# Patient Record
Sex: Male | Born: 1940 | Race: Black or African American | Hispanic: No | Marital: Married | Smoking: Former smoker
Health system: Southern US, Community
[De-identification: ages and names within clinical notes are randomized; demographics above are authoritative.]

## PROBLEM LIST (undated history)

## (undated) DIAGNOSIS — I509 Heart failure, unspecified: Secondary | ICD-10-CM

## (undated) DIAGNOSIS — J8410 Pulmonary fibrosis, unspecified: Secondary | ICD-10-CM

## (undated) DIAGNOSIS — IMO0001 Reserved for inherently not codable concepts without codable children: Secondary | ICD-10-CM

## (undated) DIAGNOSIS — I10 Essential (primary) hypertension: Secondary | ICD-10-CM

## (undated) DIAGNOSIS — I4891 Unspecified atrial fibrillation: Secondary | ICD-10-CM

## (undated) DIAGNOSIS — J449 Chronic obstructive pulmonary disease, unspecified: Secondary | ICD-10-CM

## (undated) DIAGNOSIS — I639 Cerebral infarction, unspecified: Secondary | ICD-10-CM

## (undated) HISTORY — DX: Unspecified atrial fibrillation: I48.91

## (undated) HISTORY — DX: Essential (primary) hypertension: I10

## (undated) HISTORY — DX: Cerebral infarction, unspecified: I63.9

## (undated) HISTORY — PX: FEMUR FRACTURE SURGERY: SHX633

---

## 1973-07-11 HISTORY — PX: TIBIA FRACTURE SURGERY: SHX806

## 1999-07-12 HISTORY — PX: BACK SURGERY: SHX140

## 2000-04-26 ENCOUNTER — Encounter: Payer: Self-pay | Admitting: Neurosurgery

## 2000-04-26 ENCOUNTER — Inpatient Hospital Stay (HOSPITAL_COMMUNITY): Admission: RE | Admit: 2000-04-26 | Discharge: 2000-04-27 | Payer: Self-pay | Admitting: Neurosurgery

## 2000-08-21 ENCOUNTER — Inpatient Hospital Stay (HOSPITAL_COMMUNITY): Admission: EM | Admit: 2000-08-21 | Discharge: 2000-08-28 | Payer: Self-pay

## 2000-08-23 ENCOUNTER — Encounter: Payer: Self-pay | Admitting: Internal Medicine

## 2000-08-26 ENCOUNTER — Encounter: Payer: Self-pay | Admitting: Internal Medicine

## 2000-09-25 ENCOUNTER — Encounter: Payer: Self-pay | Admitting: Internal Medicine

## 2000-09-25 ENCOUNTER — Ambulatory Visit (HOSPITAL_COMMUNITY): Admission: RE | Admit: 2000-09-25 | Discharge: 2000-09-25 | Payer: Self-pay | Admitting: Internal Medicine

## 2000-09-29 ENCOUNTER — Ambulatory Visit (HOSPITAL_COMMUNITY): Admission: RE | Admit: 2000-09-29 | Discharge: 2000-09-29 | Payer: Self-pay | Admitting: Internal Medicine

## 2000-09-29 ENCOUNTER — Encounter: Payer: Self-pay | Admitting: Internal Medicine

## 2005-02-14 ENCOUNTER — Ambulatory Visit (HOSPITAL_COMMUNITY): Admission: RE | Admit: 2005-02-14 | Discharge: 2005-02-14 | Payer: Self-pay | Admitting: Internal Medicine

## 2005-09-23 ENCOUNTER — Inpatient Hospital Stay (HOSPITAL_COMMUNITY): Admission: EM | Admit: 2005-09-23 | Discharge: 2005-09-28 | Payer: Self-pay | Admitting: Family Medicine

## 2005-09-26 ENCOUNTER — Encounter (INDEPENDENT_AMBULATORY_CARE_PROVIDER_SITE_OTHER): Payer: Self-pay | Admitting: Interventional Cardiology

## 2009-06-11 ENCOUNTER — Encounter: Payer: Self-pay | Admitting: Internal Medicine

## 2009-06-11 ENCOUNTER — Ambulatory Visit (HOSPITAL_COMMUNITY): Admission: RE | Admit: 2009-06-11 | Discharge: 2009-06-11 | Payer: Self-pay | Admitting: Internal Medicine

## 2009-06-11 ENCOUNTER — Ambulatory Visit: Payer: Self-pay | Admitting: Surgery

## 2009-11-10 ENCOUNTER — Encounter: Payer: Self-pay | Admitting: Cardiology

## 2010-02-02 ENCOUNTER — Ambulatory Visit: Payer: Self-pay | Admitting: Cardiology

## 2010-02-02 DIAGNOSIS — I10 Essential (primary) hypertension: Secondary | ICD-10-CM

## 2010-02-02 DIAGNOSIS — E785 Hyperlipidemia, unspecified: Secondary | ICD-10-CM | POA: Insufficient documentation

## 2010-02-02 DIAGNOSIS — I480 Paroxysmal atrial fibrillation: Secondary | ICD-10-CM | POA: Insufficient documentation

## 2010-02-04 LAB — CONVERTED CEMR LAB
BUN: 15 mg/dL (ref 6–23)
Basophils Relative: 0.6 % (ref 0.0–3.0)
CO2: 29 meq/L (ref 19–32)
Chloride: 105 meq/L (ref 96–112)
GFR calc non Af Amer: 66.96 mL/min (ref >60)
HCT: 46.1 % (ref 39.0–52.0)
Lymphocytes Relative: 43 % (ref 12.0–46.0)
MCV: 97.5 fL (ref 78.0–100.0)
Neutro Abs: 2.4 10*3/uL (ref 1.4–7.7)
Neutrophils Relative %: 39.8 % — ABNORMAL LOW (ref 43.0–77.0)
Sodium: 141 meq/L (ref 135–145)
WBC: 6 10*3/uL (ref 4.5–10.5)

## 2010-02-05 ENCOUNTER — Ambulatory Visit: Payer: Self-pay | Admitting: Cardiovascular Disease

## 2010-02-05 ENCOUNTER — Ambulatory Visit: Payer: Self-pay

## 2010-02-10 ENCOUNTER — Ambulatory Visit: Payer: Self-pay | Admitting: Cardiovascular Disease

## 2010-02-16 ENCOUNTER — Encounter: Payer: Self-pay | Admitting: Cardiology

## 2010-02-16 ENCOUNTER — Ambulatory Visit: Payer: Self-pay | Admitting: Cardiology

## 2010-02-16 ENCOUNTER — Ambulatory Visit (HOSPITAL_COMMUNITY): Admission: RE | Admit: 2010-02-16 | Discharge: 2010-02-16 | Payer: Self-pay | Admitting: Cardiology

## 2010-02-16 ENCOUNTER — Ambulatory Visit: Payer: Self-pay

## 2010-02-16 LAB — CONVERTED CEMR LAB: POC INR: 2.4

## 2010-02-18 ENCOUNTER — Ambulatory Visit: Payer: Self-pay | Admitting: Cardiology

## 2010-02-18 DIAGNOSIS — R0602 Shortness of breath: Secondary | ICD-10-CM

## 2010-02-23 ENCOUNTER — Telehealth (INDEPENDENT_AMBULATORY_CARE_PROVIDER_SITE_OTHER): Payer: Self-pay | Admitting: *Deleted

## 2010-02-24 ENCOUNTER — Ambulatory Visit: Payer: Self-pay | Admitting: Cardiovascular Disease

## 2010-02-24 ENCOUNTER — Encounter (HOSPITAL_COMMUNITY): Admission: RE | Admit: 2010-02-24 | Discharge: 2010-03-29 | Payer: Self-pay | Admitting: Cardiology

## 2010-02-24 ENCOUNTER — Encounter: Payer: Self-pay | Admitting: Cardiovascular Disease

## 2010-02-24 ENCOUNTER — Ambulatory Visit: Payer: Self-pay

## 2010-02-24 LAB — CONVERTED CEMR LAB: POC INR: 3.6

## 2010-03-04 ENCOUNTER — Ambulatory Visit: Payer: Self-pay | Admitting: Internal Medicine

## 2010-03-18 ENCOUNTER — Ambulatory Visit: Payer: Self-pay | Admitting: Cardiology

## 2010-03-18 LAB — CONVERTED CEMR LAB: POC INR: 2

## 2010-04-08 ENCOUNTER — Ambulatory Visit: Payer: Self-pay | Admitting: Cardiology

## 2010-04-08 LAB — CONVERTED CEMR LAB: POC INR: 1.6

## 2010-04-22 ENCOUNTER — Ambulatory Visit: Payer: Self-pay | Admitting: Cardiology

## 2010-05-20 ENCOUNTER — Ambulatory Visit: Payer: Self-pay | Admitting: Cardiology

## 2010-06-02 ENCOUNTER — Ambulatory Visit: Payer: Self-pay | Admitting: Internal Medicine

## 2010-06-14 ENCOUNTER — Ambulatory Visit: Payer: Self-pay | Admitting: Cardiology

## 2010-06-14 ENCOUNTER — Encounter: Payer: Self-pay | Admitting: Cardiology

## 2010-06-14 DIAGNOSIS — I6350 Cerebral infarction due to unspecified occlusion or stenosis of unspecified cerebral artery: Secondary | ICD-10-CM | POA: Insufficient documentation

## 2010-06-30 ENCOUNTER — Ambulatory Visit: Payer: Self-pay | Admitting: Internal Medicine

## 2010-07-28 ENCOUNTER — Ambulatory Visit: Admission: RE | Admit: 2010-07-28 | Discharge: 2010-07-28 | Payer: Self-pay | Source: Home / Self Care

## 2010-08-10 NOTE — Medication Information (Signed)
Summary: rov/sl  Anticoagulant Therapy  Managed by: Porfirio Oar, PharmD Referring MD: Eustace Quail, MD PCP: Dr. Jeanann Lewandowsky Supervising MD: Harrington Challenger MD, Nevin Bloodgood Indication 1: Atrial Fibrillation Lab Used: [facility] [place] Site: [place] INR POC 2.1 INR RANGE 2.0-3.0  Dietary changes: no    Health status changes: no    Bleeding/hemorrhagic complications: no    Recent/future hospitalizations: no    Any changes in medication regimen? no    Recent/future dental: no  Any missed doses?: yes     Details: missed yesterday's dose  Is patient compliant with meds? yes       Allergies: No Known Drug Allergies  Anticoagulation Management History:      The patient is taking warfarin and comes in today for a routine follow up visit.  Positive risk factors for bleeding include an age of 16 years or older.  The bleeding index is 'intermediate risk'.  Positive CHADS2 values include History of HTN.  Negative CHADS2 values include Age > 34 years old.  Anticoagulation responsible provider: Harrington Challenger MD, Nevin Bloodgood.  INR POC: 2.1.  Cuvette Lot#: [id number].  Exp: 04/2011.    Anticoagulation Management Assessment/Plan:      The patient's current anticoagulation dose is Warfarin sodium 5 mg tabs: Use as directed by Anticoagulation Clinic.  The target INR is 2.0-3.0.  The next INR is due 03/18/2010.  Results were reviewed/authorized by Porfirio Oar, PharmD.  He was notified by Porfirio Oar PharmD.         Prior Anticoagulation Instructions: INR 3.6  Tomorrow, Thursday, August 18th, do not take Coumadin. Then, take Coumadin 1 tab (5 mg) on all days except Coumadin 1.5 tabs (7.5 mg) on Saturdays. Return to clinic in 1 week.   Current Anticoagulation Instructions: INR 2.1  Take an extra 1/2 tablet today then resume same dose of 1 tablet every day except 1 1/2 tablets on Saturday.

## 2010-08-10 NOTE — Assessment & Plan Note (Signed)
Summary: Patrick Weiss   Visit Type:  Follow-up Primary Provider:  Dr. Jeanann Lewandowsky  CC:  no complaints.  History of Present Illness: The patient is 70 years old and return for management of atrial fibrillation. He was found to be in nature fibrillation on a routine office visit 2 weeks ago. He had no symptoms. I saw him in consultation and we started him on warfarin and Cardizem. He has had no symptoms since that time.  We did an echocardiogram which showed an ejection fraction of 50-55% no significant valvular abnormalities. We did laboratory studies including a TSH which were normal.  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 He recovered completely from this.  Current Medications (verified): 1)  Warfarin Sodium 5 Mg Tabs (Warfarin Sodium) .... Use As Directed By Anticoagulation Clinic 2)  Azor 10-40 Mg Tabs (Amlodipine-Olmesartan) .... Take One Tab By Mouth Once Daily 3)  Tylenol Arthritis Pain 650 Mg Cr-Tabs (Acetaminophen) .... Take Two Tablets By Mouth Once Daily 4)  Aspirin 81 Mg Tbec (Aspirin) .... Take One Tablet By Mouth Daily 5)  Allegra 180 Mg Tabs (Fexofenadine Hcl) .... Take One Tab By Mouth Once Daily As Needed 6)  Aleve 220 Mg Tabs (Naproxen Sodium) .... Take One Tab By Mouth Once Daily As Needed 7)  Diazepam 5 Mg Tabs (Diazepam) .... Take As Needed 8)  Diltiazem Hcl Er Beads 180 Mg Xr24h-Cap (Diltiazem Hcl Er Beads) .... Take One Capsule By Mouth Daily 9)  Refresh 1.4-0.6 % Soln (Polyvinyl Alcohol-Povidone) .... As Directed  Allergies (verified): No Known Drug Allergies  Past History:  Past Medical History: Reviewed history from 02/02/2010 and no changes required. history of stroke vertigo  Review of Systems       ROS is negative except as outlined in HPI.   Vital Signs:  Patient profile:   70 year old male Height:      71 inches Weight:      204 pounds BMI:     28.56 Pulse rate:    77 / minute Resp:     16 per minute BP sitting:   117 / 75  (left arm)  Vitals Entered By: Lubertha Basque, CNA (February 18, 2010 1:49 PM)  Physical Exam  Additional Exam:  Gen. Well-nourished, in no distress   Neck: No JVD, thyroid not enlarged, no carotid bruits Lungs: No tachypnea, clear without rales, rhonchi or wheezes Cardiovascular: Rhythm regular, PMI not displaced,  heart sounds  normal, no murmurs or gallops, no peripheral edema, pulses normal in all 4 extremities. Abdomen: BS normal, abdomen soft and non-tender without masses or organomegaly, no hepatosplenomegaly. MS: No deformities, no cyanosis or clubbing   Neuro:  No focal sns   Skin:  no lesions    Impression & Recommendations:  Problem # 1:  ATRIAL FIBRILLATION (ICD-427.31) He is in sinus rhythm today. He is currently being managed with rate control and warfarin. His [place] score is one with a risk factor of hypertension.  Hiis [place] Vasc score would be 3.  I think we will probably want to continue long-term Coumadin but we'll make a decision when I see him back in followup in 4 months. His updated medication list for this problem includes:    Warfarin Sodium 5 Mg Tabs (Warfarin sodium) ..... Use as directed by anticoagulation clinic    Aspirin 81 Mg Tbec (Aspirin) .Marland Kitchen... Take one tablet by mouth daily  Problem # 2:  HYPERTENSION, BENIGN (ICD-401.1) This is well controlled on current medications. He brought his readings in from home and they were in the range of 120s over 80s. His updated medication list for this problem includes:    Azor 10-40 Mg Tabs (Amlodipine-olmesartan) .Marland Kitchen... Take one tab by mouth once daily    Aspirin 81 Mg Tbec (Aspirin) .Marland Kitchen... Take one tablet by mouth daily    Diltiazem Hcl Er Beads 180 Mg Xr24h-cap (Diltiazem hcl er beads) .Marland Kitchen... Take one capsule by mouth daily  Orders: EKG w/ Interpretation (93000) Nuclear Stress Test (Nuc Stress Test)  Problem # 3:  VKFMMCRFVOHKGO-VPCHE (KBT-248.4) he is  currently on no medications and in view of the fact that he has a carotid occlusion we may want to readdress this later.  Problem # 4:  DYSPNEA (ICD-786.05) He has had some increased dyspnea on exertion. He also has known vascular disease he is at moderately high risk for potential coronary problems. He also has a very strong family history. We will plan to evaluate him with a rest stress Myoview scan. His updated medication list for this problem includes:    Azor 10-40 Mg Tabs (Amlodipine-olmesartan) .Marland Kitchen... Take one tab by mouth once daily    Aspirin 81 Mg Tbec (Aspirin) .Marland Kitchen... Take one tablet by mouth daily    Diltiazem Hcl Er Beads 180 Mg Xr24h-cap (Diltiazem hcl er beads) .Marland Kitchen... Take one capsule by mouth daily  Patient Instructions: 1)  Your physician recommends that you schedule a follow-up appointment in: December 2011. 2)  Your physician has requested that you have an exercise stress myoview.  For further information please visit [email or web address].  Please follow instruction sheet, as given. 3)  Your physician recommends that you continue on your current medications as directed. Please refer to the Current Medication list given to you today.

## 2010-08-10 NOTE — Medication Information (Signed)
Summary: rov/mw  Anticoagulant Therapy  Managed by: Porfirio Oar, PharmD Referring MD: Eustace Quail, MD PCP: Dr. Jeanann Lewandowsky Supervising MD: Stanford Breed MD, Aaron Edelman Indication 1: Atrial Fibrillation Lab Used: [facility] [place] Site: [place] INR POC 2.2 INR RANGE 2.0-3.0  Dietary changes: no    Health status changes: no    Bleeding/hemorrhagic complications: no    Recent/future hospitalizations: no    Any changes in medication regimen? no    Recent/future dental: no  Any missed doses?: no         Allergies: No Known Drug Allergies  Anticoagulation Management History:      The patient is taking warfarin and comes in today for a routine follow up visit.  Positive risk factors for bleeding include an age of 69 years or older.  The bleeding index is 'intermediate risk'.  Positive CHADS2 values include History of HTN.  Negative CHADS2 values include Age > 30 years old.  Anticoagulation responsible provider: Stanford Breed MD, Aaron Edelman.  INR POC: 2.2.  Cuvette Lot#: [id number].  Exp: 05/2011.    Anticoagulation Management Assessment/Plan:      The patient's current anticoagulation dose is Warfarin sodium 5 mg tabs: Use as directed by Anticoagulation Clinic.  The target INR is 2.0-3.0.  The next INR is due 05/20/2010.  Results were reviewed/authorized by Porfirio Oar, PharmD.  He was notified by Ralene Bathe, PharmD Candidate.         Prior Anticoagulation Instructions: INR 1.6  Today take an extra half tablet. Then resume normal schedule of 1 tablet everyday except 1.5 tablets on saturday. Recheck in 2 weeks.   Current Anticoagulation Instructions: INR 2.2  Continue Coumadin as scheduled:  1 tablet every day of the week, except 1 and 1/2 tablets on Saturday.  Return to clinic in 4 weeks.

## 2010-08-10 NOTE — Letter (Signed)
Summary: [facility] Office Visit Note   [facility] Office Visit Note   Imported By: Sallee Provencal 02/05/2010 11:32:38  _____________________________________________________________________  External Attachment:    Type:   Image     Comment:   External Document

## 2010-08-10 NOTE — Progress Notes (Signed)
Summary: Office Visit: [place]   Office Visit: [place]   Imported By: Roddie Mc 02/02/2010 13:07:52  _____________________________________________________________________  External Attachment:    Type:   Image     Comment:   External Document

## 2010-08-10 NOTE — Assessment & Plan Note (Signed)
Summary: Cardiology Nuclear Testing  Nuclear Med Background Indications for Stress Test: Evaluation for Ischemia  Indications Comments: New onset PAF  History: Echo  History Comments: 02/16/10 Echo:EF=50-55%  Symptoms: Dizziness, DOE, Fatigue, Rapid HR    Nuclear Pre-Procedure Cardiac Risk Factors: CVA, History of Smoking, Hypertension, Lipids Caffeine/Decaff Intake: None NPO After: 7:00 PM Lungs: Clear. IV 0.9% NS with Angio Cath: 22g     IV Site: (R) Hand IV Started by: Irven Baltimore RN Chest Size (in) 42L     Height (in): 71 Weight (lb): 203 BMI: 28.42  Nuclear Med Study 1 or 2 day study:  1 day     Stress Test Type:  Stress Reading MD:  Jenkins Rouge, MD     Referring MD:  Eustace Quail, MD Resting Radionuclide:  Technetium 18mTetrofosmin     Resting Radionuclide Dose:  10.9 mCi  Stress Radionuclide:  Technetium 976metrofosmin     Stress Radionuclide Dose:  33.0 mCi   Stress Protocol Exercise Time (min):  4:21 min     Max HR:  162 bpm     Predicted Max HR:  15620pm  Max Systolic BP: 21355m Hg     Percent Max HR:  106.58 %     METS: 5.5 Rate Pressure Product:  [id number]  Stress Test Technologist:  ShValetta FullerMA-N     Nuclear Technologist:  StCharlton AmorNMT  Rest Procedure  Myocardial perfusion imaging was performed at rest 45 minutes following the intravenous administration of Myoview Technetium 9994mtrofosmin.  Stress Procedure  The patient exercised for 4:21.  The patient stopped due to a bronchospasm with O2 Sat dropping to 78%  and expiratory wheezes. He denied any chest pain.  There were no diagnostic ST-T wave changes with exercise, but there were some nonspecific T wave changes in late recovery.  There were occasional PVC's with rare couplet, PAC's and a brief run of SVT.  He had a hypertensive response to exercise, 211/109. Myoview was injected at peak exercise and myocardial perfusion imaging was performed after a brief delay.   The patient was given a  nebulizer treatment with albuterol in recovery with complete resolution of bronchospasm.                                                                                                                                                                                                          QPS Raw Data Images:  Normal; no motion artifact; normal heart/lung ratio. Stress Images:  NI: Uniform and normal uptake of tracer  in all myocardial segments. Rest Images:  Normal homogeneous uptake in all areas of the myocardium. Subtraction (SDS):  SDS 2 scattered Transient Ischemic Dilatation:  .85  (Normal <1.22)  Lung/Heart Ratio:  .28  (Normal <0.45)  Quantitative Gated Spect Images QGS EDV:  137 ml QGS ESV:  69 ml QGS EF:  49 % QGS cine images:  No discrete RWMAs  Findings Low risk nuclear study      Overall Impression  Exercise Capacity: Poor exercise capacity. BP Response: Hypertensive blood pressure response. Clinical Symptoms: Dysnpnea ECG Impression: No significant ST segment change suggestive of ischemia. Overall Impression: Poor exercise tolerance with desaturation and hypertensive response.  Thinning of the inferior base not thought to be significant.  Prominant RV.  Suggest echo for clinical correlation of EF    Appended Document: Cardiology Nuclear Testing Per Dr. Olevia Perches- test ok.  Pt aware of results.

## 2010-08-10 NOTE — Medication Information (Signed)
Summary: rov/sp  Anticoagulant Therapy  Managed by: Freddrick March, RN, BSN Referring MD: Eustace Quail, MD PCP: Dr. Jeanann Lewandowsky Supervising MD: Stanford Breed MD, Aaron Edelman Indication 1: Atrial Fibrillation Lab Used: [facility] [place] Site: [place] INR POC 2.0 INR RANGE 2.0-3.0  Dietary changes: no    Health status changes: no    Bleeding/hemorrhagic complications: no    Recent/future hospitalizations: no    Any changes in medication regimen? no    Recent/future dental: no  Any missed doses?: no       Is patient compliant with meds? yes       Allergies: No Known Drug Allergies  Anticoagulation Management History:      The patient is taking warfarin and comes in today for a routine follow up visit.  Positive risk factors for bleeding include an age of 23 years or older.  The bleeding index is 'intermediate risk'.  Positive CHADS2 values include History of HTN.  Negative CHADS2 values include Age > 72 years old.  Anticoagulation responsible provider: Stanford Breed MD, Aaron Edelman.  INR POC: 2.0.  Cuvette Lot#: [id number].  Exp: 05/2011.    Anticoagulation Management Assessment/Plan:      The patient's current anticoagulation dose is Warfarin sodium 5 mg tabs: Use as directed by Anticoagulation Clinic.  The target INR is 2.0-3.0.  The next INR is due 04/08/2010.  Results were reviewed/authorized by Freddrick March, RN, BSN.  He was notified by Freddrick March RN.         Prior Anticoagulation Instructions: INR 2.1  Take an extra 1/2 tablet today then resume same dose of 1 tablet every day except 1 1/2 tablets on Saturday.   Current Anticoagulation Instructions: INR 2.0  Take an extra 1/2 tablet today, then resume same dosage 1 tablet daily except 1.5 tablets on Saturdays.  Recheck in 3 weeks.

## 2010-08-10 NOTE — Assessment & Plan Note (Signed)
Summary: 4 MONTH ROV   Visit Type:  Follow-up Primary Provider:  Dr. Jeanann Lewandowsky  CC:  4 month ROV; No Complaints.  History of Present Illness: The patient is 70 years old and return for management of atrial fibrillation. He was found to be in nature fibrillation on a routine office visit recently. He had no symptoms. I saw him in consultation and we started him on warfarin and Cardizem. He has had no symptoms since that time.  We did an echocardiogram which showed an ejection fraction of 50-55% no significant valvular abnormalities. We did laboratory studies including a TSH which were normal.  We also did a Myoview scan which showed some inferior thinning but no ischemia and ejection fraction of 49%. He did have a hypertensive response to exercise.  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 He recovered completely from this.  He is followed by Dr. Jake Shark at [facility] neurology.    He is been doing well since his last visit with no chest pain or palpitations. He did have recent episode of bronchitis with cough and shortness of breath treated by Dr. Carlis Abbott.  Current Medications (verified): 1)  Warfarin Sodium 5 Mg Tabs (Warfarin Sodium) .... Use As Directed By Anticoagulation Clinic 2)  Azor 10-40 Mg Tabs (Amlodipine-Olmesartan) .... Take One Tab By Mouth Once Daily 3)  Tylenol Arthritis Pain 650 Mg Cr-Tabs (Acetaminophen) .... Take Two Tablets By Mouth Once Daily 4)  Aspirin 81 Mg Tbec (Aspirin) .... Take One Tablet By Mouth Daily 5)  Allegra 180 Mg Tabs (Fexofenadine Hcl) .... Take One Tab By Mouth Once Daily As Needed 6)  Aleve 220 Mg Tabs (Naproxen Sodium) .... Take One Tab By Mouth Once Daily As Needed 7)  Diazepam 5 Mg Tabs (Diazepam) .... Take As Needed 8)  Diltiazem Hcl Er Beads 180 Mg Xr24h-Cap (Diltiazem Hcl Er Beads) .... Take One Capsule By Mouth Daily 9)  Refresh 1.4-0.6 % Soln (Polyvinyl  Alcohol-Povidone) .... As Directed  Allergies: No Known Drug Allergies  Past History:  Family History: Last updated: 02/20/2010 His mother died of lung problems. His father died of cancer . He has a paternal uncle with heart disease.  Social History: Last updated: 02/20/10 he is retired from [employer] He used to work for [employer]  He quit smoking many years ago.  He is widowed and lives alone.  Past Medical History: Reviewed history from 20-Feb-2010 and no changes required. history of stroke vertigo  Review of Systems       ROS is negative except as outlined in HPI.   Vital Signs:  Patient profile:   70 year old male Height:      72 inches Weight:      205 pounds BMI:     27.90 Pulse rate:   62 / minute Pulse rhythm:   regular BP sitting:   140 / 90  (left arm) Cuff size:   regular  Vitals Entered By: Eliezer Lofts, EMT-P (June 14, 2010 9:52 AM)  Physical Exam  Additional Exam:  Gen. Well-nourished, in no distress   Neck: No JVD, thyroid not enlarged, no carotid bruits Lungs: No tachypnea, clear without rales, rhonchi or wheezes Cardiovascular: Rhythm regular, PMI not displaced,  heart sounds  normal, no murmurs or gallops, no peripheral edema, pulses normal in all 4 extremities. Abdomen: BS normal, abdomen soft and non-tender without masses or organomegaly, no  hepatosplenomegaly. MS: No deformities, no cyanosis or clubbing   Neuro:  No focal sns   Skin:  no lesions    Impression & Recommendations:  Problem # 1:  ATRIAL FIBRILLATION (ICD-427.31)  He has paroxysmal atrial fibrillation and is asymptomatic with this. His ECG shows normal sinus rhythm today. His [place] VASC Score is 3.   I think his risk of stroke is high enough that we should continue Coumadin. He is okay with this. His updated medication list for this problem includes:    Warfarin Sodium 5 Mg Tabs (Warfarin sodium) ..... Use as directed by anticoagulation clinic    Aspirin 81 Mg Tbec  (Aspirin) .Marland Kitchen... Take one tablet by mouth daily  Orders: EKG w/ Interpretation (93000)  Problem # 2:  HYPERTENSION, BENIGN (ICD-401.1)  His updated medication list for this problem includes:    Azor 10-40 Mg Tabs (Amlodipine-olmesartan) .Marland Kitchen... Take one tab by mouth once daily    Aspirin 81 Mg Tbec (Aspirin) .Marland Kitchen... Take one tablet by mouth daily    Diltiazem Hcl Er Beads 180 Mg Xr24h-cap (Diltiazem hcl er beads) .Marland Kitchen... Take one capsule by mouth daily  His updated medication list for this problem includes:    Azor 10-40 Mg Tabs (Amlodipine-olmesartan) .Marland Kitchen... Take one tab by mouth once daily    Aspirin 81 Mg Tbec (Aspirin) .Marland Kitchen... Take one tablet by mouth daily    Diltiazem Hcl Er Beads 180 Mg Xr24h-cap (Diltiazem hcl er beads) .Marland Kitchen... Take one capsule by mouth daily  Orders: EKG w/ Interpretation (93000)  Problem # 3:  CVA (ICD-434.91) He has a history of previous stroke for which he is completely recovered. 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 His updated medication list for this problem includes:    Warfarin Sodium 5 Mg Tabs (Warfarin sodium) ..... Use as directed by anticoagulation clinic    Aspirin 81 Mg Tbec (Aspirin) .Marland Kitchen... Take one tablet by mouth daily  Patient Instructions: 1)  Your physician recommends that you continue on your current medications as directed. Please refer to the Current Medication list given to you today. 2)  Your physician wants you to follow-up in: 1 year with Dr. Rayann Heman.  You will receive a reminder letter in the mail two months in advance. If you don't receive a letter, please call our office to schedule the follow-up appointment.

## 2010-08-10 NOTE — Progress Notes (Signed)
Summary: Nuclear pre procedure  Phone Note Outgoing Call Call back at Home Phone [phone]   Call placed by: Valetta Fuller, [place],  February 23, 2010 4:48 PM Call placed to: Patient Summary of Call: Left message with information on Myoview Information Sheet (see scanned document for details).      Nuclear Med Background Indications for Stress Test: Evaluation for Ischemia  Indications Comments: New onset PAF  History: Echo  History Comments: 02/16/10 Echo:EF=50-55%  Symptoms: DOE    Nuclear Pre-Procedure Cardiac Risk Factors: CVA, History of Smoking, Hypertension, Lipids Height (in): 71  Nuclear Med Study Referring MD:  Olevia Perches

## 2010-08-10 NOTE — Medication Information (Signed)
Summary: rov/ewj  Anticoagulant Therapy  Managed by: Porfirio Oar, PharmD Referring MD: Olevia Perches PCP: Dr. Jeanann Lewandowsky Supervising MD: Johnsie Cancel MD, Collier Salina Indication 1: Atrial Fibrillation Lab Used: [facility] [place] Site: [place] INR POC 1.8 INR RANGE 2.0-3.0  Dietary changes: no    Health status changes: no    Bleeding/hemorrhagic complications: no    Recent/future hospitalizations: no    Any changes in medication regimen? no    Recent/future dental: no  Any missed doses?: no       Is patient compliant with meds? yes       Allergies: No Known Drug Allergies  Anticoagulation Management History:      The patient is taking warfarin and comes in today for a routine follow up visit.  Positive risk factors for bleeding include an age of 69 years or older.  The bleeding index is 'intermediate risk'.  Positive CHADS2 values include History of HTN.  Negative CHADS2 values include Age > 69 years old.  Anticoagulation responsible provider: Johnsie Cancel MD, Collier Salina.  INR POC: 1.8.  Cuvette Lot#: [id number].  Exp: 04/2011.    Anticoagulation Management Assessment/Plan:      The patient's current anticoagulation dose is Warfarin sodium 5 mg tabs: Use as directed by Anticoagulation Clinic.  The target INR is 2.0-3.0.  The next INR is due 02/16/2010.  Results were reviewed/authorized by Porfirio Oar, PharmD.  He was notified by Vassie Loll, PharmD Candidate.         Prior Anticoagulation Instructions: INR 1.1  Take 1.5 tablets tomorrow, then resume same dosage 1 tablet daily.  Recheck on Wednesday.    Current Anticoagulation Instructions: INR- 1.8  Take 1.5 tablets (7.5 mg) Wed and Sat.  Take 1 tablet (5 mg) on Sun, Mon, Tues, Thurs, and Fri.

## 2010-08-10 NOTE — Assessment & Plan Note (Signed)
Summary: check pulse/427.31/saf  Nurse Visit   Vital Signs:  Patient profile:   70 year old male Pulse rate:   72 / minute  Vitals Entered By: nterbeck  Allergies: No Known Drug Allergies

## 2010-08-10 NOTE — Medication Information (Signed)
Summary: rov/ewj  Anticoagulant Therapy  Managed by: Mammie Lorenzo, PharmD Referring MD: Eustace Quail, MD PCP: Dr. Jeanann Lewandowsky Supervising MD: Stanford Breed MD, Aaron Edelman Indication 1: Atrial Fibrillation Lab Used: [facility] [place] Site: [place] INR POC 1.6 INR RANGE 2.0-3.0  Dietary changes: no    Health status changes: no    Bleeding/hemorrhagic complications: no    Recent/future hospitalizations: no    Any changes in medication regimen? no    Recent/future dental: no  Any missed doses?: no       Is patient compliant with meds? yes      Comments: counseled patient on vitamin K containing foods again and provided information as requested.  Allergies: No Known Drug Allergies  Anticoagulation Management History:      The patient is taking warfarin and comes in today for a routine follow up visit.  Positive risk factors for bleeding include an age of 69 years or older.  The bleeding index is 'intermediate risk'.  Positive CHADS2 values include History of HTN.  Negative CHADS2 values include Age > 18 years old.  Anticoagulation responsible provider: Stanford Breed MD, Aaron Edelman.  INR POC: 1.6.  Cuvette Lot#: [id number].  Exp: 05/2011.    Anticoagulation Management Assessment/Plan:      The patient's current anticoagulation dose is Warfarin sodium 5 mg tabs: Use as directed by Anticoagulation Clinic.  The target INR is 2.0-3.0.  The next INR is due 04/22/2010.  Results were reviewed/authorized by Mammie Lorenzo, PharmD.         Prior Anticoagulation Instructions: INR 2.0  Take an extra 1/2 tablet today, then resume same dosage 1 tablet daily except 1.5 tablets on Saturdays.  Recheck in 3 weeks.    Current Anticoagulation Instructions: INR 1.6  Today take an extra half tablet. Then resume normal schedule of 1 tablet everyday except 1.5 tablets on saturday. Recheck in 2 weeks.

## 2010-08-10 NOTE — Assessment & Plan Note (Signed)
Summary: NP6   Visit Type:  Initial Consult- A-fib Primary Provider:  Dr. Jeanann Lewandowsky  CC:  Has had problems with intermittent dizziness.Marland Kitchen  History of Present Illness: Patrick Weiss is 70 years old and was referred for evaluation of recent onset atrial fibrillation. He was seen in the office of his primary care physician today and found to be in atrial fibrillation. He is vaguely aware of an irregular heartbeat and feels this may have been going on for a few weeks.  He has no known previous heart disease. He has had hypertension and he has had a previous stroke for which he is followed at [facility]. 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 He has no residual symptoms from his previous stroke.  He has had no recent chest pain shortness of breath. He has had some dizziness last week.  He has a history of hyperlipidemia and had been on simvastatin in the past but this was discontinued and he's not sure for the reasons  Current Medications (verified): 1)  Aggrenox 25-200 Mg Xr12h-Cap (Aspirin-Dipyridamole) .... Take One Tab By Mouth Two Times A Day 2)  Azor 10-40 Mg Tabs (Amlodipine-Olmesartan) .... Take One Tab By Mouth Once Daily 3)  Tylenol Arthritis Pain 650 Mg Cr-Tabs (Acetaminophen) .... Take Two Tablets By Mouth Once Daily 4)  Aspirin 81 Mg Tbec (Aspirin) .... Take One Tablet By Mouth Daily 5)  Allegra 180 Mg Tabs (Fexofenadine Hcl) .... Take One Tab By Mouth Once Daily As Needed 6)  Aleve 220 Mg Tabs (Naproxen Sodium) .... Take One Tab By Mouth Once Daily As Needed 7)  Diazepam 5 Mg Tabs (Diazepam) .... Take As Needed  Allergies (verified): No Known Drug Allergies  Past History:  Past Medical History: Hypertension Previous stroke Hyperlipidemia Atrial fibrillation  Family History: His mother died of lung problems. His father died of cancer . He has a paternal  uncle with heart disease.  Social History: he is retired from [employer] He used to work for [employer]  He quit smoking many years ago.  He is widowed and lives alone.  Review of Systems       ROS is negative except as outlined in HPI.   Vital Signs:  Patient profile:   70 year old male Height:      71 inches Weight:      209 pounds BMI:     29.25 Pulse rate:   131 / minute Pulse rhythm:   irregular BP sitting:   119 / 88  (left arm)  Vitals Entered By: Lubertha Basque, CNA (February 02, 2010 1:23 PM)  Physical Exam  Additional Exam:  Gen. Well-nourished, in no distress   Neck: JVD just at the clavicle, thyroid not enlarged, no carotid bruits Lungs: No tachypnea, clear without rales, rhonchi or wheezes Cardiovascular: Rhythm irregular, PMI not displaced,  heart sounds  normal, no murmurs or gallops, trace to 1+ bilateral peripheral edema, pulses normal in all 4 extremities. Abdomen: BS normal, abdomen soft and non-tender without masses or organomegaly, no hepatosplenomegaly. MS: No deformities, no cyanosis or clubbing   Neuro:  No focal sns   Skin:  no lesions    Impression & Recommendations:  Problem # 1:  ATRIAL FIBRILLATION (ICD-427.31) He has new-onset atrial fibrillation. He is mildly symptomatic from this with a mild awareness of palpitations and possible decreased exercise tolerance. He has a  history of previous stroke related to a total occlusion of his carotid on the right. He has hypertension so his [place] score is probably one.  We will start him on Cardizem CD 180 for rate control. We will start Coumadin and stop the Aggrenox. We will have a Coumadin clinic followup in 3 days and get an apical pulse check at bedtime. We will get a 2-D echocardiogram. We'll probably arrange for stress test later after his rate is controlled. I'll see him back in 2 weeks. We'll get laboratory studies including a TSH. His updated medication list for this problem includes:    Warfarin  Sodium 5 Mg Tabs (Warfarin sodium) ..... Use as directed by anticoagulation clinic    Aspirin 81 Mg Tbec (Aspirin) .Marland Kitchen... Take one tablet by mouth daily  Problem # 2:  HYPERTENSION, BENIGN (ICD-401.1) His blood pressure is well controlled on current medications. His updated medication list for this problem includes:    Azor 10-40 Mg Tabs (Amlodipine-olmesartan) .Marland Kitchen... Take one tab by mouth once daily    Aspirin 81 Mg Tbec (Aspirin) .Marland Kitchen... Take one tablet by mouth daily    Diltiazem Hcl Er Beads 180 Mg Xr24h-cap (Diltiazem hcl er beads) .Marland Kitchen... Take one capsule by mouth daily  Problem # 3:  HYPERLIPIDEMIA-MIXED (ICD-272.4) We'll address this after his atrial fibrillation problem has been addressed.  Other Orders: EKG w/ Interpretation (93000) Echocardiogram (Echo) [place]. Coumadin Clinic Referral (Coumadin clinic) TLB-BMP (Basic Metabolic Panel-BMET) (40981-XBJYNWG) TLB-CBC Platelet - w/Differential (85025-CBCD) TLB-TSH (Thyroid Stimulating Hormone) (84443-TSH)  Patient Instructions: 1)  Start Cardizem (diltiazem) 159m once daily. 2)  Start Coumadin (warfarin) 582monce daily. 3)  Stop Aggrenox. 4)  You will need a coumadin clinic appointment on friday 7/29. 5)  You will need a pulse check with the nurse on friday 7/29. 6)  Your physician has requested that you have an echocardiogram.  Echocardiography is a painless test that uses sound waves to create images of your heart. It provides your doctor with information about the size and shape of your heart and how well your heart's chambers and valves are working.  This procedure takes approximately one hour. There are no restrictions for this procedure. 7)  Your physician recommends that you schedule a follow-up appointment in: 2 weeks- Dr. BrOlevia Perchess adding an office day on 02/18/10 in the afternoon. We will contact you about an appointment time for that day. 8)  Your physician recommends that you have lab work today: bmet/cbc/tsh  (427.31) Prescriptions: DILTIAZEM HCL ER BEADS 180 MG XR24H-CAP (DILTIAZEM HCL ER BEADS) Take one capsule by mouth daily  #30 x 6   Entered by:   HeAlvis LemmingsRN, BSN   Authorized by:   BrFatima SangerMD, FAOrlando Surgicare Ltd Signed by:   HeAlvis LemmingsRN, BSN on 02/02/2010   Method used:   [phone]       [address]       [place]  [id number]     Ph: [phone]     Fax: [phone] RxID:   [id number] SODIUM 5 MG TABS (WARFARIN SODIUM) Use as directed by Anticoagulation Clinic  #30 x 3   Entered by:   HeAlvis LemmingsRN, BSN   Authorized by:   BrFatima SangerMD, FAAsc[facility] Signed by:   HeNira Conn  Mikle Bosworth, RN, BSN on 02/02/2010   Method used:   [facility]        [facility] [address] [phone][facility] (retail)       [address]       [place], [place]  [address]       Ph: [phone] or [phone]       Fax: [phone]   RxID:   [id number]

## 2010-08-10 NOTE — Medication Information (Signed)
Summary: started 7/26/31m/afib/saf  Anticoagulant Therapy  Managed by: EFreddrick March RN, BSN Referring MD: Dr BDonna ChristenPCP: Dr. PJeanann LewandowskySupervising MD: NJohnsie CancelMD, PCollier SalinaIndication 1: Atrial Fibrillation [facility] INR RANGE 2.0-3.0  Dietary changes: yes       Details: Educated on importance of consistancy of vit K in the diet.   Health status changes: no    Bleeding/hemorrhagic complications: yes       Details: Pt educated to monitor for s+s of bleeding and to call with problems.    Recent/future hospitalizations: no    Any changes in medication regimen? yes       Details: Medication list confirmed, advised to notify clinic with any medication changes.   Recent/future dental: no  Any missed doses?: yes     Details: Advised pt on importance medication compliance taking dose daily at same time.   Is patient compliant with meds? yes      Comments: Pt started on Coumadin on 02/03/10 561mdaily at [facility] with Dr BrOlevia Perchesew onset afib.  Pt has been on Coumadin in 2007. New pt education completed.   Allergies: No Known Drug Allergies  Anticoagulation Management History:      The patient comes in today for his initial visit for anticoagulation therapy.  Positive risk factors for bleeding include an age of 6589ears or older.  The bleeding index is 'intermediate risk'.  Positive CHADS2 values include History of HTN.  Negative CHADS2 values include Age > 7566ears old.  Anticoagulation responsible provider: NiJohnsie CancelD, PeCollier Salina INR POC: 1.1.  Cuvette Lot#: [id number] Exp: 04/2011.    Anticoagulation Management Assessment/Plan:      The patient's current anticoagulation dose is Warfarin sodium 5 mg tabs: Use as directed by Anticoagulation Clinic.  The target INR is 2.0-3.0.  The next INR is due 02/10/2010.  Results were reviewed/authorized by ErFreddrick MarchRN, BSN.  He was notified by ErFreddrick MarchRN, BSN.         Current  Anticoagulation Instructions: INR 1.1  Take 1.5 tablets tomorrow, then resume same dosage 1 tablet daily.  Recheck on Wednesday.

## 2010-08-10 NOTE — Medication Information (Signed)
Summary: rov/cs  Anticoagulant Therapy  Managed by: Porfirio Oar, PharmD Referring MD: Eustace Quail, MD PCP: Dr. Jeanann Lewandowsky Supervising MD: Stanford Breed MD, Aaron Edelman Indication 1: Atrial Fibrillation Lab [facility] [place] Site: [place] INR POC 2.1 INR RANGE 2.0-3.0  Dietary changes: no    Health status changes: yes       Details: respiratory infection  Bleeding/hemorrhagic complications: no    Recent/future hospitalizations: no    Any changes in medication regimen? yes       Details: Avelox 437m daily x7d & Advair 250/50 BID   Recent/future dental: no  Any missed doses?: no       Is patient compliant with meds? yes       Allergies: No Known Drug Allergies  Anticoagulation Management History:      The patient is taking warfarin and comes in today for a routine follow up visit.  Positive risk factors for bleeding include an age of 89years or older.  The bleeding index is 'intermediate risk'.  Positive CHADS2 values include History of HTN.  Negative CHADS2 values include Age > 780years old.  Anticoagulation responsible provider: CStanford BreedMD, BAaron Edelman  INR POC: 2.1.  Cuvette Lot#: [id number]  Exp: 05/2011.    Anticoagulation Management Assessment/Plan:      The patient's current anticoagulation dose is Warfarin sodium 5 mg tabs: Use as directed by Anticoagulation Clinic.  The target INR is 2.0-3.0.  The next INR is due 06/02/2010.  Results were reviewed/authorized by SPorfirio Oar PharmD.  He was notified by NCarmin Richmond PharmD Candidate.         Prior Anticoagulation Instructions: INR 2.2  Continue Coumadin as scheduled:  1 tablet every day of the week, except 1 and 1/2 tablets on Saturday.  Return to clinic in 4 weeks.    Current Anticoagulation Instructions: INR 2.1 Continue previous dose of 1 tablet everyday except 1.5 tablets on Saturday Recheck INR in 2 weeks.

## 2010-08-10 NOTE — Medication Information (Signed)
Summary: rov/ewj  Anticoagulant Therapy  Managed by: Gypsy Lore, PharmD Referring MD: Olevia Perches PCP: Dr. Jeanann Lewandowsky Supervising MD: Johnsie Cancel MD, Collier Salina Indication 1: Atrial Fibrillation Lab Used: [facility] [place] Site: [place] INR POC 3.6 INR RANGE 2.0-3.0  Dietary changes: no    Health status changes: no    Bleeding/hemorrhagic complications: no    Recent/future hospitalizations: no    Any changes in medication regimen? no    Recent/future dental: no  Any missed doses?: no       Is patient compliant with meds? yes       Current Medications (verified): 1)  Warfarin Sodium 5 Mg Tabs (Warfarin Sodium) .... Use As Directed By Anticoagulation Clinic 2)  Azor 10-40 Mg Tabs (Amlodipine-Olmesartan) .... Take One Tab By Mouth Once Daily 3)  Tylenol Arthritis Pain 650 Mg Cr-Tabs (Acetaminophen) .... Take Two Tablets By Mouth Once Daily 4)  Aspirin 81 Mg Tbec (Aspirin) .... Take One Tablet By Mouth Daily 5)  Allegra 180 Mg Tabs (Fexofenadine Hcl) .... Take One Tab By Mouth Once Daily As Needed 6)  Aleve 220 Mg Tabs (Naproxen Sodium) .... Take One Tab By Mouth Once Daily As Needed 7)  Diazepam 5 Mg Tabs (Diazepam) .... Take As Needed 8)  Diltiazem Hcl Er Beads 180 Mg Xr24h-Cap (Diltiazem Hcl Er Beads) .... Take One Capsule By Mouth Daily 9)  Refresh 1.4-0.6 % Soln (Polyvinyl Alcohol-Povidone) .... As Directed  Allergies (verified): No Known Drug Allergies  Anticoagulation Management History:      The patient is taking warfarin and comes in today for a routine follow up visit.  Positive risk factors for bleeding include an age of 69 years or older.  The bleeding index is 'intermediate risk'.  Positive CHADS2 values include History of HTN.  Negative CHADS2 values include Age > 46 years old.  Anticoagulation responsible provider: Johnsie Cancel MD, Collier Salina.  INR POC: 3.6.  Cuvette Lot#: [id number].  Exp: 04/2011.    Anticoagulation Management Assessment/Plan:      The  patient's current anticoagulation dose is Warfarin sodium 5 mg tabs: Use as directed by Anticoagulation Clinic.  The target INR is 2.0-3.0.  The next INR is due 03/05/2010.  Results were reviewed/authorized by Gypsy Lore, PharmD.  He was notified by Gypsy Lore PharmD.         Prior Anticoagulation Instructions: INR 2.4  Continue on same dosage 1 tablet daily except 1.5 tablets on Wednesdays and Saturdays.  Recheck in 1 week.    Current Anticoagulation Instructions: INR 3.6  Tomorrow, Thursday, August 18th, do not take Coumadin. Then, take Coumadin 1 tab (5 mg) on all days except Coumadin 1.5 tabs (7.5 mg) on Saturdays. Return to clinic in 1 week.

## 2010-08-10 NOTE — Medication Information (Signed)
Summary: rov/jk  Anticoagulant Therapy  Managed by: Freddrick March, RN, BSN Referring MD: Olevia Perches PCP: Dr. Jeanann Lewandowsky Supervising MD: Ron Parker MD, Dellis Filbert Indication 1: Atrial Fibrillation Lab Used: [facility] [place] Site: [place] INR POC 2.4 INR RANGE 2.0-3.0  Dietary changes: no    Health status changes: no    Bleeding/hemorrhagic complications: no    Recent/future hospitalizations: no    Any changes in medication regimen? no    Recent/future dental: no  Any missed doses?: no       Is patient compliant with meds? yes       Allergies: No Known Drug Allergies  Anticoagulation Management History:      The patient is taking warfarin and comes in today for a routine follow up visit.  Positive risk factors for bleeding include an age of 69 years or older.  The bleeding index is 'intermediate risk'.  Positive CHADS2 values include History of HTN.  Negative CHADS2 values include Age > 39 years old.  Anticoagulation responsible provider: Ron Parker MD, Dellis Filbert.  INR POC: 2.4.  Cuvette Lot#: [id number].  Exp: 04/2011.    Anticoagulation Management Assessment/Plan:      The patient's current anticoagulation dose is Warfarin sodium 5 mg tabs: Use as directed by Anticoagulation Clinic.  The target INR is 2.0-3.0.  The next INR is due 02/23/2010.  Results were reviewed/authorized by Freddrick March, RN, BSN.  He was notified by Freddrick March RN.         Prior Anticoagulation Instructions: INR- 1.8  Take 1.5 tablets (7.5 mg) Wed and Sat.  Take 1 tablet (5 mg) on Sun, Mon, Tues, Thurs, and Fri.    Current Anticoagulation Instructions: INR 2.4  Continue on same dosage 1 tablet daily except 1.5 tablets on Wednesdays and Saturdays.  Recheck in 1 week.

## 2010-08-10 NOTE — Medication Information (Signed)
Summary: rov/nb  Anticoagulant Therapy  Managed by: Gypsy Lore, PharmD Referring MD: Eustace Quail, MD PCP: Dr. Jeanann Lewandowsky Supervising MD: Haroldine Laws MD, Quillian Quince Indication 1: Atrial Fibrillation Lab Used: [facility] [place] Site: [place] INR POC 2.2 INR RANGE 2.0-3.0  Dietary changes: no    Health status changes: no    Bleeding/hemorrhagic complications: no    Recent/future hospitalizations: no    Any changes in medication regimen? yes       Details: Avelox and Advair for wheezing. Finished last week.   Recent/future dental: no  Any missed doses?: no       Is patient compliant with meds? yes       Allergies: No Known Drug Allergies  Anticoagulation Management History:      The patient is taking warfarin and comes in today for a routine follow up visit.  Positive risk factors for bleeding include an age of 69 years or older.  The bleeding index is 'intermediate risk'.  Positive CHADS2 values include History of HTN.  Negative CHADS2 values include Age > 69 years old.  Anticoagulation responsible provider: Bensimhon MD, Quillian Quince.  INR POC: 2.2.  Cuvette Lot#: [id number].  Exp: 05/2011.    Anticoagulation Management Assessment/Plan:      The patient's current anticoagulation dose is Warfarin sodium 5 mg tabs: Use as directed by Anticoagulation Clinic.  The target INR is 2.0-3.0.  The next INR is due 06/30/2010.  Results were reviewed/authorized by Gypsy Lore, PharmD.  He was notified by Gypsy Lore PharmD.         Prior Anticoagulation Instructions: INR 2.1 Continue previous dose of 1 tablet everyday except 1.5 tablets on Saturday Recheck INR in 2 weeks.  Current Anticoagulation Instructions: INR 2.2  Continue taking Coumadin 1 tab (5 mg) every day except for Coumadin 1.5 tabs (7.5 mg) on Saturdays.  Return to clinic in 4 weeks.

## 2010-08-12 NOTE — Medication Information (Signed)
Summary: rov coumadin - [facility]  Anticoagulant Therapy  Managed by: Porfirio Oar, PharmD Referring MD: Eustace Quail, MD PCP: Dr. Jeanann Lewandowsky Supervising MD: Aundra Dubin MD, Kirk Ruths Indication 1: Atrial Fibrillation Lab [facility] [place] Site: [place] INR POC 2.2 INR RANGE 2.0-3.0  Dietary changes: no    Health status changes: no    Bleeding/hemorrhagic complications: no    Recent/future hospitalizations: no    Any changes in medication regimen? no    Recent/future dental: no  Any missed doses?: no       Is patient compliant with meds? yes       Current Medications (verified): 1)  Warfarin Sodium 5 Mg Tabs (Warfarin Sodium) .... Use As Directed By Anticoagulation Clinic 2)  Azor 10-40 Mg Tabs (Amlodipine-Olmesartan) .... Take One Tab By Mouth Once Daily 3)  Tylenol Arthritis Pain 650 Mg Cr-Tabs (Acetaminophen) .... Take Two Tablets By Mouth Once Daily 4)  Aspirin 81 Mg Tbec (Aspirin) .... Take One Tablet By Mouth Daily 5)  Allegra 180 Mg Tabs (Fexofenadine Hcl) .... Take One Tab By Mouth Once Daily As Needed 6)  Aleve 220 Mg Tabs (Naproxen Sodium) .... Take One Tab By Mouth Once Daily As Needed 7)  Diazepam 5 Mg Tabs (Diazepam) .... Take As Needed 8)  Diltiazem Hcl Er Beads 180 Mg Xr24h-Cap (Diltiazem Hcl Er Beads) .... Take One Capsule By Mouth Daily 9)  Refresh 1.4-0.6 % Soln (Polyvinyl Alcohol-Povidone) .... As Directed  Allergies (verified): No Known Drug Allergies  Anticoagulation Management History:      The patient is taking warfarin and comes in today for a routine follow up visit.  Positive risk factors for bleeding include an age of 69 years or older and history of CVA/TIA.  The bleeding index is 'intermediate risk'.  Positive CHADS2 values include History of HTN and Prior Stroke/CVA/TIA.  Negative CHADS2 values include Age > 69 years old.  Anticoagulation responsible provider: Aundra Dubin MD, [place].  INR POC: 2.2.  Cuvette Lot#: [id number].  Exp:  07/2011.    Anticoagulation Management Assessment/Plan:      The patient's current anticoagulation dose is Warfarin sodium 5 mg tabs: Use as directed by Anticoagulation Clinic.  The target INR is 2.0-3.0.  The next INR is due 08/25/2010.  Results were reviewed/authorized by Porfirio Oar, PharmD.  He was notified by Nilda Simmer, PharmD Candidate .         Prior Anticoagulation Instructions: INR 1.9  Coumadin 46m tabs - TAKE extra 1/2 tab today Wed 12/21  then 1 tab each day EXCEPT 1.5 tab on SAT  Current Anticoagulation Instructions: INR 2.2  Coumadin 5 mg tablets - Continue 1 tablet every day except 1.5 tablets on Saturdays

## 2010-08-12 NOTE — Medication Information (Signed)
Summary: rov/sl  Anticoagulant Therapy  Managed by: Bonnita Nasuti, PharmD, BCPS, CPP Referring MD: Eustace Quail, MD PCP: Dr. Jeanann Lewandowsky Supervising MD: Haroldine Laws MD, Quillian Quince Indication 1: Atrial Fibrillation Lab Used: [facility] Site: [place] INR POC 1.9 INR RANGE 2.0-3.0  Dietary changes: no    Health status changes: no    Bleeding/hemorrhagic complications: no    Recent/future hospitalizations: no    Any changes in medication regimen? no    Recent/future dental: no  Any missed doses?: no       Is patient compliant with meds? yes       Current Medications (verified): 1)  Warfarin Sodium 5 Mg Tabs (Warfarin Sodium) .... Use As Directed By Anticoagulation Clinic 2)  Azor 10-40 Mg Tabs (Amlodipine-Olmesartan) .... Take One Tab By Mouth Once Daily 3)  Tylenol Arthritis Pain 650 Mg Cr-Tabs (Acetaminophen) .... Take Two Tablets By Mouth Once Daily 4)  Aspirin 81 Mg Tbec (Aspirin) .... Take One Tablet By Mouth Daily 5)  Allegra 180 Mg Tabs (Fexofenadine Hcl) .... Take One Tab By Mouth Once Daily As Needed 6)  Aleve 220 Mg Tabs (Naproxen Sodium) .... Take One Tab By Mouth Once Daily As Needed 7)  Diazepam 5 Mg Tabs (Diazepam) .... Take As Needed 8)  Diltiazem Hcl Er Beads 180 Mg Xr24h-Cap (Diltiazem Hcl Er Beads) .... Take One Capsule By Mouth Daily 9)  Refresh 1.4-0.6 % Soln (Polyvinyl Alcohol-Povidone) .... As Directed  Allergies (verified): No Known Drug Allergies  Anticoagulation Management History:      The patient is taking warfarin and comes in today for a routine follow up visit.  Positive risk factors for bleeding include an age of 69 years or older and history of CVA/TIA.  The bleeding index is 'intermediate risk'.  Positive CHADS2 values include History of HTN and Prior Stroke/CVA/TIA.  Negative CHADS2 values include Age > 69 years old.  Anticoagulation responsible provider: Bensimhon MD, Quillian Quince.  INR POC: 1.9.  Cuvette Lot#: [id number].   Exp: 05/2011.    Anticoagulation Management Assessment/Plan:      The patient's current anticoagulation dose is Warfarin sodium 5 mg tabs: Use as directed by Anticoagulation Clinic.  The target INR is 2.0-3.0.  The next INR is due 07/28/2010.  Results were reviewed/authorized by Bonnita Nasuti, PharmD, BCPS, CPP.         Prior Anticoagulation Instructions: INR 2.2  Continue taking Coumadin 1 tab (5 mg) every day except for Coumadin 1.5 tabs (7.5 mg) on Saturdays.  Return to clinic in 4 weeks.   Current Anticoagulation Instructions: INR 1.9  Coumadin 59m tabs - TAKE extra 1/2 tab today Wed 12/21  then 1 tab each day EXCEPT 1.5 tab on SAT

## 2010-08-25 ENCOUNTER — Encounter (INDEPENDENT_AMBULATORY_CARE_PROVIDER_SITE_OTHER): Payer: Medicare Other

## 2010-08-25 ENCOUNTER — Encounter: Payer: Self-pay | Admitting: Cardiology

## 2010-08-25 DIAGNOSIS — Z7901 Long term (current) use of anticoagulants: Secondary | ICD-10-CM

## 2010-08-25 DIAGNOSIS — I4891 Unspecified atrial fibrillation: Secondary | ICD-10-CM

## 2010-09-01 NOTE — Medication Information (Signed)
Summary: Coumadin Clinic  Anticoagulant Therapy  Managed by: Porfirio Oar, PharmD Referring MD: Eustace Quail, MD PCP: Dr. Jeanann Lewandowsky Supervising MD: Aundra Dubin MD, Kirk Ruths Indication 1: Atrial Fibrillation Lab Used: [facility] [place] Site: [place] INR POC 2.1 INR RANGE 2.0-3.0  Dietary changes: no    Health status changes: no    Bleeding/hemorrhagic complications: no    Recent/future hospitalizations: no    Any changes in medication regimen? yes       Details: Stopped Azor and switched to Benicar 40; diltiazem will be increased to 265m (pt. has new prescription but has not filled it yet)  Recent/future dental: no  Any missed doses?: no       Is patient compliant with meds? yes       Current Medications (verified): 1)  Warfarin Sodium 5 Mg Tabs (Warfarin Sodium) .... Use As Directed By Anticoagulation Clinic 2)  Tylenol Arthritis Pain 650 Mg Cr-Tabs (Acetaminophen) .... Take Two Tablets By Mouth Once Daily 3)  Aspirin 81 Mg Tbec (Aspirin) .... Take One Tablet By Mouth Daily 4)  Allegra 180 Mg Tabs (Fexofenadine Hcl) .... Take One Tab By Mouth Once Daily As Needed 5)  Aleve 220 Mg Tabs (Naproxen Sodium) .... Take One Tab By Mouth Once Daily As Needed 6)  Diazepam 5 Mg Tabs (Diazepam) .... Take As Needed 7)  Diltiazem Hcl Er Beads 240 Mg Xr24h-Cap (Diltiazem Hcl Er Beads) ..Marland Kitchen. 1 Capsule Daily 8)  Refresh 1.4-0.6 % Soln (Polyvinyl Alcohol-Povidone) .... As Directed 9)  Benicar 40 Mg Tabs (Olmesartan Medoxomil) ..Marland Kitchen. 1 Tablet Daily  Allergies: No Known Drug Allergies  Anticoagulation Management History:      The patient is taking warfarin and comes in today for a routine follow up visit.  Positive risk factors for bleeding include an age of 774years or older and history of CVA/TIA.  The bleeding index is 'intermediate risk'.  Positive CHADS2 values include History of HTN and Prior Stroke/CVA/TIA.  Negative CHADS2 values include Age > 751years old.   Anticoagulation responsible provider: MAundra DubinMD, Cabria Micalizzi.  INR POC: 2.1.  Cuvette Lot#: [id number]  Exp: 07/2011.    Anticoagulation Management Assessment/Plan:      The patient's current anticoagulation dose is Warfarin sodium 5 mg tabs: Use as directed by Anticoagulation Clinic.  The target INR is 2.0-3.0.  The next INR is due 09/22/2010.  Results were reviewed/authorized by SPorfirio Oar PharmD.  He was notified by CMliss SaxPharmD Candidate.         Prior Anticoagulation Instructions: INR 2.2  Coumadin 5 mg tablets - Continue 1 tablet every day except 1.5 tablets on Saturdays   Current Anticoagulation Instructions: INR: 2.1  Continue to take 1 tablet everyday except on Saturdays when you take 1 and 1/2 tablets.  Recheck INR in 4 weeks.

## 2010-09-06 ENCOUNTER — Encounter: Payer: Self-pay | Admitting: Internal Medicine

## 2010-09-06 DIAGNOSIS — I4891 Unspecified atrial fibrillation: Secondary | ICD-10-CM

## 2010-09-06 DIAGNOSIS — I6350 Cerebral infarction due to unspecified occlusion or stenosis of unspecified cerebral artery: Secondary | ICD-10-CM

## 2010-09-22 ENCOUNTER — Encounter (INDEPENDENT_AMBULATORY_CARE_PROVIDER_SITE_OTHER): Payer: Medicare Other

## 2010-09-22 ENCOUNTER — Encounter: Payer: Self-pay | Admitting: Cardiology

## 2010-09-22 DIAGNOSIS — I4891 Unspecified atrial fibrillation: Secondary | ICD-10-CM

## 2010-09-22 DIAGNOSIS — Z7901 Long term (current) use of anticoagulants: Secondary | ICD-10-CM

## 2010-09-28 NOTE — Medication Information (Signed)
Summary: rov/sp  Anticoagulant Therapy  Managed by: Ella Jubilee, PharmD Referring MD: Eustace Quail, MD PCP: Dr. Jeanann Lewandowsky Supervising MD: Aundra Dubin MD, [place] Indication 1: Atrial Fibrillation Lab Used: [facility] [place] Site: [place] INR POC 2.4 INR RANGE 2.0-3.0  Dietary changes: no    Health status changes: no    Bleeding/hemorrhagic complications: no    Recent/future hospitalizations: no    Any changes in medication regimen? no    Recent/future dental: no  Any missed doses?: no       Is patient compliant with meds? yes       Current Medications (verified): 1)  Warfarin Sodium 5 Mg Tabs (Warfarin Sodium) .... Use As Directed By Anticoagulation Clinic 2)  Tylenol Arthritis Pain 650 Mg Cr-Tabs (Acetaminophen) .... Take Two Tablets By Mouth Once Daily 3)  Aspirin 81 Mg Tbec (Aspirin) .... Take One Tablet By Mouth Daily 4)  Allegra 180 Mg Tabs (Fexofenadine Hcl) .... Take One Tab By Mouth Once Daily As Needed 5)  Aleve 220 Mg Tabs (Naproxen Sodium) .... Take One Tab By Mouth Once Daily As Needed 6)  Diazepam 5 Mg Tabs (Diazepam) .... Take As Needed 7)  Diltiazem Hcl Er Beads 240 Mg Xr24h-Cap (Diltiazem Hcl Er Beads) .Marland Kitchen.. 1 Capsule Daily 8)  Refresh 1.4-0.6 % Soln (Polyvinyl Alcohol-Povidone) .... As Directed 9)  Benicar 40 Mg Tabs (Olmesartan Medoxomil) .Marland Kitchen.. 1 Tablet Daily  Allergies (verified): No Known Drug Allergies  Anticoagulation Management History:      Positive risk factors for bleeding include an age of 83 years or older and history of CVA/TIA.  The bleeding index is 'intermediate risk'.  Positive CHADS2 values include History of HTN and Prior Stroke/CVA/TIA.  Negative CHADS2 values include Age > 72 years old.  Anticoagulation responsible provider: Aundra Dubin MD, [place].  INR POC: 2.4.  Exp: 07/2011.    Anticoagulation Management Assessment/Plan:      The patient's current anticoagulation dose is Warfarin sodium 5 mg tabs: Use as directed  by Anticoagulation Clinic.  The target INR is 2.0-3.0.  The next INR is due 10/20/2010.  Results were reviewed/authorized by Ella Jubilee, PharmD.         Prior Anticoagulation Instructions: INR: 2.1  Continue to take 1 tablet everyday except on Saturdays when you take 1 and 1/2 tablets.  Recheck INR in 4 weeks.      Current Anticoagulation Instructions: Return to clinic in 4 weeks Cont with current regimen

## 2010-10-20 ENCOUNTER — Ambulatory Visit (INDEPENDENT_AMBULATORY_CARE_PROVIDER_SITE_OTHER): Payer: Medicare Other | Admitting: *Deleted

## 2010-10-20 DIAGNOSIS — I4891 Unspecified atrial fibrillation: Secondary | ICD-10-CM

## 2010-10-20 DIAGNOSIS — I6350 Cerebral infarction due to unspecified occlusion or stenosis of unspecified cerebral artery: Secondary | ICD-10-CM

## 2010-10-20 DIAGNOSIS — Z7901 Long term (current) use of anticoagulants: Secondary | ICD-10-CM | POA: Insufficient documentation

## 2010-10-27 ENCOUNTER — Telehealth: Payer: Self-pay | Admitting: Internal Medicine

## 2010-10-27 MED ORDER — WARFARIN SODIUM 5 MG PO TABS: 5.0000 mg | ORAL_TABLET | ORAL | Status: DC

## 2010-10-27 NOTE — Telephone Encounter (Signed)
Called spoke with pt advised rx refill requested was sent electronically to pharmacy. Rx refill sent.

## 2010-10-29 ENCOUNTER — Encounter: Payer: Self-pay | Admitting: Internal Medicine

## 2010-11-17 ENCOUNTER — Ambulatory Visit (INDEPENDENT_AMBULATORY_CARE_PROVIDER_SITE_OTHER): Payer: Medicare Other | Admitting: *Deleted

## 2010-11-17 DIAGNOSIS — I4891 Unspecified atrial fibrillation: Secondary | ICD-10-CM

## 2010-11-17 DIAGNOSIS — I6350 Cerebral infarction due to unspecified occlusion or stenosis of unspecified cerebral artery: Secondary | ICD-10-CM

## 2010-11-17 LAB — POCT INR: INR: 2

## 2010-11-22 ENCOUNTER — Encounter: Payer: Self-pay | Admitting: *Deleted

## 2010-11-26 NOTE — H&P (Signed)
NAME:  Patrick Weiss, Patrick Weiss                ACCOUNT NO.:  [id number]   MEDICAL RECORD NO.:  [id number]          PATIENT TYPE:  EMS   LOCATION:  [facility]                         FACILITY:  [facility]   PHYSICIAN:  Pramod P. Leonie Man, MD    DATE OF BIRTH:  [birth year]   DATE OF ADMISSION:  09/23/2005  DATE OF DISCHARGE:                                HISTORY & PHYSICAL   REFERRING PHYSICIAN:  Jeanann Lewandowsky, M.D.   REASON FOR REFERRAL:  Stroke.   HISTORY OF PRESENT ILLNESS:  Patrick Weiss is a 70 year old African-American  gentleman who developed a frontal headache for the last 4 days.  The patient  denies any other focal symptoms, however, his wife states that she has  noticed he has a change in his behavior.  He has had decreased spontaneity  and he sits all day and does not do his usual activities.  He is also slow  to respond to questions.  He has also had some short term memory  difficulties.  He has been able to speak.  There is no focal extremity  weakness, gait, or balance problems.  The patient has had no prior history  of stroke, TIA's, seizures, or significant neurological problems.  He did  have an episode of transient right eye and retro orbital headache about 3  weeks ago for which he was seen by Johna Sheriff, M.D., ophthalmologist,  whose eye examination was unremarkable.  The headache did not recur.  He has  no known prior neurological problems.   PAST MEDICAL HISTORY:  Significant for hypertension and back problems with  disk surgery.   HOME MEDICATIONS:  1.  Benicar 20 mg a day.  2.  Lisinopril 10 mg a day.   ALLERGIES:  No known drug allergies.   PAST SURGICAL HISTORY:  Back surgery.   SOCIAL HISTORY:  He is retired.  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  He  lives with his wife in [place].  He quit smoking in 1993.  He drinks  alcohol only socially.   REVIEW OF SYSTEMS:  Not significant for any recent fever, cough, chest pain,  diarrhea, shortness of breath, or other  illness.   PHYSICAL EXAMINATION:  GENERAL:  Obese, 70-year-old, African-American  gentleman who is not in distress.  VITAL SIGNS:  He is afebrile, pulse rate is 90 per minute and regular, blood  pressure 145/89, respiratory rate 18 per minute, saturations 93% on room  air.  HEENT:  Head is atraumatic. ENT examination unremarkable.  NECK:  Supple without bruit.  HEART:  No murmur or gallop.  LUNGS:  Clear to auscultation.  NEUROLOGY:  The patient is pleasant, awake, alert, and cooperative.  There  is no aphasia, apraxia or dysarthria. Pupils are irregular, but reactive.  Visual acuity and fields are correct.  Face is symmetric with bilateral  movements normal.  Tongue is midline.  The patient is fairly mentally  cognizant.  He is not found to be confused.  He has slightly diminished  recall, but there is no clear aphasia.  He also appears to be spontaneous  with good initiation.  I do not think he is abulic.   Motor system examination reveals no extremity drift, symmetric strength,  tone, movements, coordination and sensation.  His gait is slow and steady  including tandem walking.   LABORATORY DATA:  CT scan of the head done today reveals a low density in  the right anterior cerebral artery distribution consistent with an acute  infarct.  There is a low density noted in the left parietal deep Genis  matter which may be compatible with a remote edge infarct as well.   Electrolytes are normal.  WBC count is unremarkable.   IMPRESSION:  A 70 year old gentleman with headache and behavioral changes  secondary to right anterior cerebral artery infarct.  Etiology to be  determined.  He also has possibly a remote age, left parietal, deep Gust  matter infarct as well.  Vascular risk factors of hypertension and age, and  mild obesity.   PLAN:  The patient will be admitted to the stroke service for further  evaluation.  We will admit him to 3000 with telemetry monitoring.  Check MRI  scan  of the brain with MRA of the brain.  Carotid Dopplers, two-dimensional  echocardiogram.  Check fasting hemoglobin A1C, lipid profile, and  homocysteine levels.  Start him on IV Lovenox one capsule twice a day for  secondary stroke prevention.  I had a long discussion with the patient and  his wife regarding his symptoms, discussed plan, further evaluation and  treatment and answered questions.  I have also discussed this case with his  primary physician, Dr. Jeanann Lewandowsky, who will resume the patient's care in  the morning.           ______________________________  Pramod P. Leonie Man, MD     PPS/MEDQ  D:  09/23/2005  T:  09/25/2005  Job:  [id number]   cc:   Jeanann Lewandowsky, M.D.  Fax: [phone]

## 2010-11-26 NOTE — Discharge Summary (Signed)
[place]. [facility]  Patient:    Patrick Weiss, Patrick Weiss                       MRN: [id number] Adm. Date:  [id number] Disc. Date: [id number] Attending:  Gloris Manchester                           Discharge Summary  DISCHARGE DIAGNOSES: 1. Pneumonia. 2. Systemic hypertension. 3. Asphyxia. 4. 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  REASON FOR ADMISSION:  [name] is a 70 year old African-American gentleman who is brought into the [facility] Emergency Room with the complaints of fever progressively more intense, frequent coughing, difficulty breathing, and dyspnea on exertion.  The patient denies any previous history of asthma, significant cigarette smoking, or lung disease, but apparently did have a history of many years of cigarette smoking before he stopped 10 years ago. After being brought into the emergency room here at [facility] and appearing in general quite ill with a temperature of 100, it was noted the patient had a 16,000 Kaestner count and chest x-ray revealing a right lower lobe infiltrate, and he was subsequently admitted for a more aggressive treatment for his pneumonia.  PERTINENT PHYSICAL FINDINGS ON ADMISSION:  GENERAL:  He is a well-developed, well-nourished middle-aged gentleman appearing prostrate, but without acute distress.  VITAL SIGNS:  His temperature was 100, blood pressure of 122/90, respiratory rate 24 to 28, and his pulse rate was 120.  HEENT:  Head was atraumatic and normocephalic.  His EOMs were full.  Sclerae were anicteric.  No conjunctival pallor.  NECK:  He had no jugular venous distension, adenopathy, or thyromegaly.  CHEST:  Respirations were shallow, but there was no splinting or tenderness. He did have markedly diminished breath sounds at the right base posterolaterally and rales in the same area, but without any evidence of consolidation.  CARDIOVASCULAR:  His cardiac exam revealed no murmurs,  rubs, heaves, or thrills.  ABDOMEN:  Soft, nontender, no organomegaly, and no masses.  EXTREMITIES:  He had no clubbing, no cyanosis, no edema, no calf tenderness, and negative Homans sign.  His pulses were 2+ and symmetrical.  MUSCULOSKELETAL:  Back:  There was no punch tenderness, no CVA tenderness, and negative Kernigs sign.  PERTINENT LABORATORY AND X-RAY DATA:  His arterial blood gas on room air on admission:  His pCO2 was 38 with a pO2 of 61, bicarbonate of 24, and his total CO2 was 20-25.  CBC:  Clevenger count was 16,500 with a shift to the left, hematocrit of 42.9, hemoglobin of 14.9,  He had increased number of bands greater than 20%.  Stool was negative for occult blood x 3.  His comprehensive metabolic panel was completely normal with the exception of a nonfasting random glucose of 143.  His TSH was normal at 1.9.  A CEA was normal at 1.2. Blood cultures x 2 revealed no growth.  His sputum revealed abundant Pagliaro blood cells being present with moderate gram-positive cocci in chains and pairs.  There were a few gram-negative rods noted.  HOSPITAL COURSE:  The patient was admitted to a regular medical surgical floor where he was started on 2 liters of O2 by nasal cannula and the concentration of oxygen adjusted to maintain his oxygen saturation at greater than 92%.  He was started on parenteral fluids 1/2 normal saline at 150 cc per hour, and after appropriate cultures were  obtained he was started on Rocephin 1 g IV q.d. along with Tequin at 400 mg IV q.d.  He was treated with expectorants, Humibid L.A. at one twice a day, along with albuterol nebulizer treatments q.6h.  His temperature was maintained less than 101 using 1 g of Tylenol q.4h., and for those times when the patient just could not sleep from the frequent coughing, we did use Tussionex at 5 cc q.12h.  An intermediate PPD skin test was negative.  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  In addition to the bullous disease and other changes of chronic obstructive lung disease, the CT scan did not reveal any evidence of a pulmonary embolus, but did suggest that there was fairly slow resolution of the patients pneumonitis.  After the first five to six days of his hospital course, the patient became afebrile on a consistent basis and with this his general sense of well being improved significantly.  After his temperature broke, the patients IV antibiotics were discontinued, and he was started on Tequin at 400 mg once q.d.  After being on this regimen for two days without any elevation in his temperature or worsening of his condition, it was felt that the patient could be discharged home with a follow-up chest x-ray to be done in several weeks.  DISCHARGE PROGNOSIS:  Considered to be quite good.  DISCHARGE CONDITION:  Much improved.  DISCHARGE MEDICATIONS: 1. The patient was subsequently discharged home on Norvasc 10 mg q.d. 2. The patient was instructed to continue his Advair at 250/50 b.i.d. 3. Tequin 400 mg q.d.  It was not felt that patient required home O2.  DISCHARGE INSTRUCTIONS:  Discharged on a 4-gram sodium diet.  DISCHARGE FOLLOWUP:  He will be scheduled to be seen back in the office in 7-10 days. DD:  10/06/98 TD:  10/06/00 Job: [id number]

## 2010-11-26 NOTE — Op Note (Signed)
[place]. [facility]  Patient:    Patrick Weiss, Patrick Weiss                       MRN: [id number] Proc. Date: 04/26/00 Adm. 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  PROCEDURE:  Right L3-4 lumbar laminotomy, extraforaminal exploration, and microdiskectomy with the operating microscope and microdissection.  SURGEON:  Hosie Spangle, M.D.  ASSISTANT:  Ophelia Charter, M.D.  ANESTHESIA:  General endotracheal.  INDICATIONS:  The patient is a 70 year old black male who presented with an acute right lumbar radiculopathy with significant weakness in the right lower extremity.  He was found by MRI scan to have a significant right L3-4 disk herniation that extends through the midline laterally to the right lateral epidural space through the foramen and the extraforaminal space, with significant thecal sac and nerve root compression.  A decision made to proceed with elective laminotomy, extraforaminal exploration, and microdiskectomy both within the canal and extraforaminally.  DESCRIPTION OF PROCEDURE:  Patient brought to the operating room and placed under general endotracheal anesthesia.  The patient was turned to a prone position, and the lumbar region was prepped with Betadine soap and solution and draped in a sterile fashion.  A localizing x-ray was taken and the L3-4 level identified.  The midline was infiltrated with local anesthetic with epinephrine, and a midline incision was made, carried down through the subcutaneous tissue, bipolar cautery and electrocautery used to maintain hemostasis.  Dissection was carried down to the lumbar fascia, which was incised on the right side of the midline and the paraspinal muscles were dissected off the spinous processes and lamina in a  subperiosteal fashion. Another x-ray was taken.  The L3-4 interlaminar space was identified.  A laminotomy was performed using the Midas Rex drill with an AM8 bur and Kerrison punches.  We then explored extraforaminally by dissecting over the L3-4 facet complex but leaving the facet capsule intact.  The right L4 transverse process was identified and the muscle overlying the L3-4 intertransverse space was swept laterally.  We then did a minimal lateral facetectomy using the Midas Rex drill as well as the Kerrison punch.  Then we opened up the intertransverse fascia and identified the right L3 nerve root and the lateral aspect of the disk space and the extraforaminal space.  We then returned to the laminotomy, went ahead and removed the ligamentum flavum, identifying the underlying thecal sac and right L4 nerve root.  These were gently retracted medially, exposing the L3-4 disk.  There was obvious subligamentous disk herniation both within the canal as well as extraforaminally, and we proceeded with diskectomy both within the canal in the right lateral epidural space as well as extraforaminally on the right L3-4 extraforaminal space by incising the annulus in both areas and using a variety of pituitary rongeurs and microcurettes to mobilize the disk herniation and other degenerative disk material.  A thorough diskectomy was performed with removal of all loose fragments of disk material from both the disk space, th epidural space, and the extraforaminal space.  Free fragments of disk herniation within the ligamentous tissue were mobilized, and we were able to decompress the neural structures and remove all  loose fragments of disk material.  Once the diskectomy was completed and the neural structures decompressed, we established hemostasis with the use of Gelfoam soaked in thrombin as well as bipolar cautery.  All the Gelfoam was removed prior to closure.  Once hemostasis was established, we  instilled 2 cc of fentanyl and 80 mg of Depo-Medrol in both the extraforaminal space, particularly around the dorsal root ganglion, as well as in the epidural space.  We then proceeded with closure.  The deep fascia was closed with interrupted undyed 0 Vicryl sutures, the subcutaneous and subcuticular closure with inverted 2-0 Vicryl suture, and the skin was reapproximated with Dermabond.  It was dressed with sterile gauze and Hypafix.  The patient was then turned back to a supine position, reversed the anesthetic, extubated, and transferred to the recovery room for further care.  He was noted to be moving all four extremities. Sponge and needle count were correct.  The estimated blood loss for the procedure was 50 cc. DD:  04/26/00 TD:  04/26/00 Job: [id number]
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
## 2010-12-01 ENCOUNTER — Ambulatory Visit (INDEPENDENT_AMBULATORY_CARE_PROVIDER_SITE_OTHER): Payer: Medicare Other | Admitting: *Deleted

## 2010-12-01 DIAGNOSIS — I4891 Unspecified atrial fibrillation: Secondary | ICD-10-CM

## 2010-12-01 DIAGNOSIS — I6350 Cerebral infarction due to unspecified occlusion or stenosis of unspecified cerebral artery: Secondary | ICD-10-CM

## 2010-12-29 ENCOUNTER — Ambulatory Visit (INDEPENDENT_AMBULATORY_CARE_PROVIDER_SITE_OTHER): Payer: Medicare Other | Admitting: *Deleted

## 2010-12-29 DIAGNOSIS — I4891 Unspecified atrial fibrillation: Secondary | ICD-10-CM

## 2010-12-29 DIAGNOSIS — I6350 Cerebral infarction due to unspecified occlusion or stenosis of unspecified cerebral artery: Secondary | ICD-10-CM

## 2011-01-26 ENCOUNTER — Encounter: Payer: Medicare Other | Admitting: *Deleted

## 2011-01-31 ENCOUNTER — Ambulatory Visit (INDEPENDENT_AMBULATORY_CARE_PROVIDER_SITE_OTHER): Payer: Medicare Other | Admitting: *Deleted

## 2011-01-31 DIAGNOSIS — I4891 Unspecified atrial fibrillation: Secondary | ICD-10-CM

## 2011-01-31 DIAGNOSIS — I6350 Cerebral infarction due to unspecified occlusion or stenosis of unspecified cerebral artery: Secondary | ICD-10-CM

## 2011-01-31 LAB — POCT INR: INR: 2.2

## 2011-02-28 ENCOUNTER — Ambulatory Visit (INDEPENDENT_AMBULATORY_CARE_PROVIDER_SITE_OTHER): Payer: Medicare Other | Admitting: *Deleted

## 2011-02-28 DIAGNOSIS — I6350 Cerebral infarction due to unspecified occlusion or stenosis of unspecified cerebral artery: Secondary | ICD-10-CM

## 2011-02-28 DIAGNOSIS — I4891 Unspecified atrial fibrillation: Secondary | ICD-10-CM

## 2011-03-07 ENCOUNTER — Other Ambulatory Visit: Payer: Self-pay | Admitting: Internal Medicine

## 2011-03-07 ENCOUNTER — Ambulatory Visit (HOSPITAL_COMMUNITY)
Admission: RE | Admit: 2011-03-07 | Discharge: 2011-03-07 | Disposition: A | Discharge status: Home / Self Care | Payer: Medicare Other | Source: Ambulatory Visit | Attending: Internal Medicine | Admitting: Internal Medicine

## 2011-03-07 DIAGNOSIS — M25519 Pain in unspecified shoulder: Secondary | ICD-10-CM | POA: Insufficient documentation

## 2011-03-07 DIAGNOSIS — M25512 Pain in left shoulder: Secondary | ICD-10-CM

## 2011-03-11 ENCOUNTER — Other Ambulatory Visit: Payer: Self-pay | Admitting: Internal Medicine

## 2011-03-28 ENCOUNTER — Ambulatory Visit (INDEPENDENT_AMBULATORY_CARE_PROVIDER_SITE_OTHER): Payer: Medicare Other | Admitting: *Deleted

## 2011-03-28 DIAGNOSIS — I6350 Cerebral infarction due to unspecified occlusion or stenosis of unspecified cerebral artery: Secondary | ICD-10-CM

## 2011-03-28 DIAGNOSIS — I4891 Unspecified atrial fibrillation: Secondary | ICD-10-CM

## 2011-04-25 ENCOUNTER — Ambulatory Visit (INDEPENDENT_AMBULATORY_CARE_PROVIDER_SITE_OTHER): Payer: Medicare Other | Admitting: *Deleted

## 2011-04-25 DIAGNOSIS — I6350 Cerebral infarction due to unspecified occlusion or stenosis of unspecified cerebral artery: Secondary | ICD-10-CM

## 2011-04-25 DIAGNOSIS — I4891 Unspecified atrial fibrillation: Secondary | ICD-10-CM

## 2011-04-25 LAB — POCT INR: INR: 3.2

## 2011-05-23 ENCOUNTER — Ambulatory Visit (INDEPENDENT_AMBULATORY_CARE_PROVIDER_SITE_OTHER): Payer: Medicare Other | Admitting: *Deleted

## 2011-05-23 DIAGNOSIS — Z7901 Long term (current) use of anticoagulants: Secondary | ICD-10-CM

## 2011-05-23 DIAGNOSIS — I6350 Cerebral infarction due to unspecified occlusion or stenosis of unspecified cerebral artery: Secondary | ICD-10-CM

## 2011-05-23 DIAGNOSIS — I4891 Unspecified atrial fibrillation: Secondary | ICD-10-CM

## 2011-05-23 LAB — POCT INR: INR: 2.6

## 2011-06-27 ENCOUNTER — Encounter: Payer: Medicare Other | Admitting: Internal Medicine

## 2011-06-28 ENCOUNTER — Encounter: Payer: Medicare Other | Admitting: *Deleted

## 2011-06-28 ENCOUNTER — Encounter: Payer: Medicare Other | Admitting: Internal Medicine

## 2011-07-07 ENCOUNTER — Ambulatory Visit (INDEPENDENT_AMBULATORY_CARE_PROVIDER_SITE_OTHER): Payer: Medicare Other | Admitting: Internal Medicine

## 2011-07-07 ENCOUNTER — Encounter: Payer: Self-pay | Admitting: Internal Medicine

## 2011-07-07 ENCOUNTER — Ambulatory Visit (INDEPENDENT_AMBULATORY_CARE_PROVIDER_SITE_OTHER): Payer: Medicare Other | Admitting: *Deleted

## 2011-07-07 DIAGNOSIS — I10 Essential (primary) hypertension: Secondary | ICD-10-CM

## 2011-07-07 DIAGNOSIS — I4891 Unspecified atrial fibrillation: Secondary | ICD-10-CM

## 2011-07-07 DIAGNOSIS — Z7901 Long term (current) use of anticoagulants: Secondary | ICD-10-CM

## 2011-07-07 DIAGNOSIS — I6350 Cerebral infarction due to unspecified occlusion or stenosis of unspecified cerebral artery: Secondary | ICD-10-CM

## 2011-07-07 NOTE — Progress Notes (Signed)
Addended by: Janan Halter F on: 07/07/2011 09:47 AM   Modules accepted: Orders

## 2011-07-07 NOTE — Progress Notes (Signed)
PCP:  No primary provider on file.  The patient presents today for routine cardiology followup.  Since last being seen in our clinic by Dr Olevia Perches, the patient reports doing very well. He is unaware of episodes of afib.   Today, he denies symptoms of palpitations, chest pain, shortness of breath, orthopnea, PND, lower extremity edema, dizziness, presyncope, syncope, or neurologic sequela.  The patient feels that he is tolerating medications without difficulties and is otherwise without complaint today.   Past Medical History  Diagnosis Date  . A-fib     paroxysmal  . Hypertension   . 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  Current Outpatient Prescriptions  Medication Sig Dispense Refill  . aspirin 81 MG tablet Take 81 mg by mouth daily.        Marland Kitchen diltiazem (CARDIZEM CD) 240 MG 24 hr capsule Take 240 mg by mouth daily.        . fexofenadine (ALLEGRA) 180 MG tablet Take 180 mg by mouth daily as needed.        . Naproxen Sodium (ALEVE) 220 MG CAPS Take 220 mg by mouth daily as needed.        Marland Kitchen olmesartan (BENICAR) 40 MG tablet Take 40 mg by mouth daily.        Marland Kitchen warfarin (COUMADIN) 5 MG tablet TAKE 1 TABLET BY MOUTH AS DIRECTED.  40 tablet  3    No Known Allergies  History   Social History  . Marital Status: Married    Spouse Name: N/A    Number of Children: N/A  . Years of Education: N/A   Occupational History  . Not on file.   Social History Main Topics  . Smoking status: Former Smoker    Types: Cigarettes    Quit date: 09/09/1991  . Smokeless tobacco: Never Used  . Alcohol Use: Yes  . Drug Use: No  . Sexually Active: Yes -- Male partner(s)   Other Topics Concern  . Not on file   Social History Narrative  . No narrative on file    Family History  Problem Relation Age of Onset  . Cancer Father     ROS-  All systems are reviewed and are negative except as outlined in  the HPI above   Physical Exam: Filed Vitals:   07/07/11 0909  BP: 136/82  Pulse: 64  Resp: 18  Height: _0  (1.803 m)  Weight: 189 lb 12.8 oz (86.093 kg)    GEN- The patient is well appearing, alert and oriented x 3 today.   Head- normocephalic, atraumatic Eyes-  Sclera clear, conjunctiva pink Ears- hearing intact Oropharynx- clear Neck- supple, no JVP Lymph- no cervical lymphadenopathy Lungs- Clear to ausculation bilaterally, normal work of breathing Heart- Regular rate and rhythm, no murmurs, rubs or gallops, PMI not laterally displaced GI- soft, NT, ND, + BS Extremities- no clubbing, cyanosis, or edema MS- no significant deformity or atrophy Skin- no rash or lesion Psych- euthymic mood, full affect Neuro- strength and sensation are intact  ekg today reveals sinus rhythm 65 bpm, PR 152, QRS 86, Qtc 463, otherwise normal ekg  Assessment and Plan:

## 2011-07-07 NOTE — Assessment & Plan Note (Signed)
Presently asymptomatic Maintaining sinus  CHADS2 score is 3 (HTN and CVA) Continue coumadin long term,  Stop ASA

## 2011-07-07 NOTE — Patient Instructions (Signed)
Your physician wants you to follow-up in: 12 months with Dr Vallery Ridge will receive a reminder letter in the mail two months in advance. If you don't receive a letter, please call our office to schedule the follow-up appointment.   Your physician has recommended you make the following change in your medication:  1) Stop Aspirin

## 2011-07-07 NOTE — Assessment & Plan Note (Signed)
Stable No change required today  

## 2011-08-04 ENCOUNTER — Ambulatory Visit (INDEPENDENT_AMBULATORY_CARE_PROVIDER_SITE_OTHER): Payer: Medicare Other | Admitting: *Deleted

## 2011-08-04 DIAGNOSIS — I6350 Cerebral infarction due to unspecified occlusion or stenosis of unspecified cerebral artery: Secondary | ICD-10-CM

## 2011-08-04 DIAGNOSIS — Z7901 Long term (current) use of anticoagulants: Secondary | ICD-10-CM

## 2011-08-04 DIAGNOSIS — I4891 Unspecified atrial fibrillation: Secondary | ICD-10-CM

## 2011-08-04 LAB — POCT INR: INR: 2.3

## 2011-08-12 ENCOUNTER — Other Ambulatory Visit: Payer: Self-pay

## 2011-08-12 MED ORDER — WARFARIN SODIUM 5 MG PO TABS: ORAL_TABLET | ORAL | Status: DC

## 2011-09-15 ENCOUNTER — Ambulatory Visit (INDEPENDENT_AMBULATORY_CARE_PROVIDER_SITE_OTHER): Payer: Medicare Other | Admitting: *Deleted

## 2011-09-15 DIAGNOSIS — Z7901 Long term (current) use of anticoagulants: Secondary | ICD-10-CM

## 2011-09-15 DIAGNOSIS — I4891 Unspecified atrial fibrillation: Secondary | ICD-10-CM

## 2011-09-15 DIAGNOSIS — I6350 Cerebral infarction due to unspecified occlusion or stenosis of unspecified cerebral artery: Secondary | ICD-10-CM

## 2011-09-15 LAB — POCT INR: INR: 2.1

## 2011-10-27 ENCOUNTER — Ambulatory Visit (INDEPENDENT_AMBULATORY_CARE_PROVIDER_SITE_OTHER): Payer: Medicare Other

## 2011-10-27 DIAGNOSIS — I6350 Cerebral infarction due to unspecified occlusion or stenosis of unspecified cerebral artery: Secondary | ICD-10-CM

## 2011-10-27 DIAGNOSIS — I4891 Unspecified atrial fibrillation: Secondary | ICD-10-CM

## 2011-10-27 DIAGNOSIS — Z7901 Long term (current) use of anticoagulants: Secondary | ICD-10-CM

## 2011-10-27 LAB — POCT INR: INR: 2.2

## 2011-12-08 ENCOUNTER — Ambulatory Visit (INDEPENDENT_AMBULATORY_CARE_PROVIDER_SITE_OTHER): Payer: Medicare Other

## 2011-12-08 DIAGNOSIS — I4891 Unspecified atrial fibrillation: Secondary | ICD-10-CM

## 2011-12-08 DIAGNOSIS — I6350 Cerebral infarction due to unspecified occlusion or stenosis of unspecified cerebral artery: Secondary | ICD-10-CM

## 2011-12-08 DIAGNOSIS — Z7901 Long term (current) use of anticoagulants: Secondary | ICD-10-CM

## 2011-12-29 ENCOUNTER — Ambulatory Visit (INDEPENDENT_AMBULATORY_CARE_PROVIDER_SITE_OTHER): Payer: Medicare Other

## 2011-12-29 ENCOUNTER — Other Ambulatory Visit: Payer: Self-pay | Admitting: Internal Medicine

## 2011-12-29 DIAGNOSIS — I4891 Unspecified atrial fibrillation: Secondary | ICD-10-CM

## 2011-12-29 DIAGNOSIS — I6350 Cerebral infarction due to unspecified occlusion or stenosis of unspecified cerebral artery: Secondary | ICD-10-CM

## 2011-12-29 DIAGNOSIS — Z7901 Long term (current) use of anticoagulants: Secondary | ICD-10-CM

## 2011-12-29 LAB — POCT INR: INR: 2.6

## 2011-12-29 MED ORDER — WARFARIN SODIUM 5 MG PO TABS: ORAL_TABLET | ORAL | Status: DC

## 2012-01-26 ENCOUNTER — Ambulatory Visit (INDEPENDENT_AMBULATORY_CARE_PROVIDER_SITE_OTHER): Payer: Medicare Other | Admitting: *Deleted

## 2012-01-26 DIAGNOSIS — I6350 Cerebral infarction due to unspecified occlusion or stenosis of unspecified cerebral artery: Secondary | ICD-10-CM

## 2012-01-26 DIAGNOSIS — Z7901 Long term (current) use of anticoagulants: Secondary | ICD-10-CM

## 2012-01-26 DIAGNOSIS — I4891 Unspecified atrial fibrillation: Secondary | ICD-10-CM

## 2012-02-23 ENCOUNTER — Ambulatory Visit (INDEPENDENT_AMBULATORY_CARE_PROVIDER_SITE_OTHER): Payer: Medicare Other | Admitting: *Deleted

## 2012-02-23 DIAGNOSIS — I4891 Unspecified atrial fibrillation: Secondary | ICD-10-CM

## 2012-02-23 DIAGNOSIS — Z7901 Long term (current) use of anticoagulants: Secondary | ICD-10-CM

## 2012-02-23 DIAGNOSIS — I6350 Cerebral infarction due to unspecified occlusion or stenosis of unspecified cerebral artery: Secondary | ICD-10-CM

## 2012-02-23 LAB — POCT INR: INR: 1.8

## 2012-02-28 ENCOUNTER — Telehealth: Payer: Self-pay | Admitting: *Deleted

## 2012-02-28 NOTE — Telephone Encounter (Signed)
Message copied by Fernande Boyden on Tue Feb 28, 2012  8:54 AM ------      Message from: Thompson Grayer      Created: Mon Feb 27, 2012  8:53 PM      Regarding: RE: procedure in September       I would prefer that the dental work be done on coumadin if possible.            IF coumadin has to be stopped, then I would only hold it for 2 days prior to the procedure and restart immediately afterwards.  If it is felt that the patient needs to hold the coumadin longer than 2 days then I would recommend lovenox bridging given prior stroke.                  ----- Message -----         From: Fernande Boyden, RN         Sent: 02/23/2012   8:21 AM           To: Thompson Grayer, MD, Dionicio Stall, RN, #      Subject: procedure in September                                   Patient states he is having periodontal work September 18th, needs clearance to hold coumadin, he states he has had  "heparin injections before with another procedure" but I couldn't find in our records. Will he need Lovenox bridging?            Thanks, Alvina Chou

## 2012-02-28 NOTE — Telephone Encounter (Signed)
Left message on machine relating to Dr Bonita Quin response to clearance for a procedure in September.

## 2012-02-29 NOTE — Telephone Encounter (Signed)
Spoke with patient and explained Dr Bonita Quin note to him, he states dentist is Dr Armandina Stammer in [place] with fax number of [phone], will fax him flowsheet and Dr Bonita Quin note.  Will also forward to Dr Jeanann Lewandowsky at patient request, he is primary care MD.

## 2012-02-29 NOTE — Telephone Encounter (Signed)
Received call from Dr Arrie Aran regarding upcoming extractions, since reviewing Dr Bonita Quin note and INR  flowsheet he states as of now he is NOT going to hold this patients coumadin for the extractions. I have called and left patient a message regarding this conversation.

## 2012-03-15 ENCOUNTER — Ambulatory Visit (INDEPENDENT_AMBULATORY_CARE_PROVIDER_SITE_OTHER): Payer: Medicare Other | Admitting: *Deleted

## 2012-03-15 DIAGNOSIS — Z7901 Long term (current) use of anticoagulants: Secondary | ICD-10-CM

## 2012-03-15 DIAGNOSIS — I4891 Unspecified atrial fibrillation: Secondary | ICD-10-CM

## 2012-03-15 DIAGNOSIS — I6350 Cerebral infarction due to unspecified occlusion or stenosis of unspecified cerebral artery: Secondary | ICD-10-CM

## 2012-03-15 LAB — POCT INR: INR: 3.3

## 2012-04-05 ENCOUNTER — Ambulatory Visit (INDEPENDENT_AMBULATORY_CARE_PROVIDER_SITE_OTHER): Payer: Medicare Other | Admitting: *Deleted

## 2012-04-05 DIAGNOSIS — I4891 Unspecified atrial fibrillation: Secondary | ICD-10-CM

## 2012-04-05 DIAGNOSIS — Z7901 Long term (current) use of anticoagulants: Secondary | ICD-10-CM

## 2012-04-05 DIAGNOSIS — I6350 Cerebral infarction due to unspecified occlusion or stenosis of unspecified cerebral artery: Secondary | ICD-10-CM

## 2012-04-05 LAB — POCT INR: INR: 3.2

## 2012-04-26 ENCOUNTER — Ambulatory Visit (INDEPENDENT_AMBULATORY_CARE_PROVIDER_SITE_OTHER): Payer: Medicare Other

## 2012-04-26 DIAGNOSIS — I6350 Cerebral infarction due to unspecified occlusion or stenosis of unspecified cerebral artery: Secondary | ICD-10-CM

## 2012-04-26 DIAGNOSIS — I4891 Unspecified atrial fibrillation: Secondary | ICD-10-CM

## 2012-04-26 DIAGNOSIS — Z7901 Long term (current) use of anticoagulants: Secondary | ICD-10-CM

## 2012-04-26 LAB — POCT INR: INR: 2.1

## 2012-05-24 ENCOUNTER — Ambulatory Visit (INDEPENDENT_AMBULATORY_CARE_PROVIDER_SITE_OTHER): Payer: Medicare Other

## 2012-05-24 DIAGNOSIS — I6350 Cerebral infarction due to unspecified occlusion or stenosis of unspecified cerebral artery: Secondary | ICD-10-CM

## 2012-05-24 DIAGNOSIS — I4891 Unspecified atrial fibrillation: Secondary | ICD-10-CM

## 2012-05-24 DIAGNOSIS — Z7901 Long term (current) use of anticoagulants: Secondary | ICD-10-CM

## 2012-05-24 LAB — POCT INR: INR: 2.3

## 2012-06-05 ENCOUNTER — Other Ambulatory Visit: Payer: Self-pay | Admitting: *Deleted

## 2012-06-05 MED ORDER — WARFARIN SODIUM 5 MG PO TABS: ORAL_TABLET | ORAL | Status: DC

## 2012-06-21 ENCOUNTER — Ambulatory Visit (INDEPENDENT_AMBULATORY_CARE_PROVIDER_SITE_OTHER): Payer: Medicare Other | Admitting: *Deleted

## 2012-06-21 DIAGNOSIS — I6350 Cerebral infarction due to unspecified occlusion or stenosis of unspecified cerebral artery: Secondary | ICD-10-CM

## 2012-06-21 DIAGNOSIS — Z7901 Long term (current) use of anticoagulants: Secondary | ICD-10-CM

## 2012-06-21 DIAGNOSIS — I4891 Unspecified atrial fibrillation: Secondary | ICD-10-CM

## 2012-07-06 ENCOUNTER — Ambulatory Visit (INDEPENDENT_AMBULATORY_CARE_PROVIDER_SITE_OTHER): Payer: Medicare Other | Admitting: Internal Medicine

## 2012-07-06 ENCOUNTER — Encounter: Payer: Self-pay | Admitting: Internal Medicine

## 2012-07-06 ENCOUNTER — Telehealth: Payer: Self-pay | Admitting: Internal Medicine

## 2012-07-06 VITALS — BP 150/90 | HR 68 | Ht 72.0 in | Wt 192.0 lb

## 2012-07-06 DIAGNOSIS — I4891 Unspecified atrial fibrillation: Secondary | ICD-10-CM

## 2012-07-06 DIAGNOSIS — I10 Essential (primary) hypertension: Secondary | ICD-10-CM

## 2012-07-06 DIAGNOSIS — E785 Hyperlipidemia, unspecified: Secondary | ICD-10-CM

## 2012-07-06 LAB — BASIC METABOLIC PANEL
BUN: 16 mg/dL (ref 6–23)
Calcium: 9.6 mg/dL (ref 8.4–10.5)
Chloride: 105 mEq/L (ref 96–112)
Creatinine, Ser: 1.3 mg/dL (ref 0.4–1.5)
GFR: 68.82 mL/min (ref >60.00)

## 2012-07-06 MED ORDER — HYDROCHLOROTHIAZIDE 25 MG PO TABS: 12.5000 mg | ORAL_TABLET | Freq: Every day | ORAL | Status: DC

## 2012-07-06 NOTE — Assessment & Plan Note (Signed)
Maintaining sinus rhythm Continue coumadin long term No changes today

## 2012-07-06 NOTE — Patient Instructions (Signed)
Your physician wants you to follow-up in: 12 months with Dr Vallery Ridge will receive a reminder letter in the mail two months in advance. If you don't receive a letter, please call our office to schedule the follow-up appointment.  Your physician recommends that you return for lab work drawn today (BMP)  Your physician has recommended you make the following change in your medication: START HCTZ 25 mg 1/2 tab daily    3 to 4 Gram Sodium Diet, No Added Salt (NAS) A 3 to 4 gram sodium diet restricts the amount of sodium in the diet to no more than 3 to 4 g or 3000 to 4000 mg daily. Limiting the amount of sodium is often used to help lower blood pressure. It is important if you have heart, liver, or kidney problems. Many foods contain sodium for flavor and sometimes as a preservative. When the amount of sodium in a diet needs to be low, it is important to know what to look for when choosing foods and drinks. The following includes some information and guidelines to help make it easier for you to adapt to a low sodium diet. QUICK TIPS  Do not add salt to food.  Avoid convenience items and fast food.  Choose unsalted snack foods.  Buy lower sodium products, often labeled as "lower sodium" or "no salt added."  Check food labels to learn how much sodium is in 1 serving.  When eating at a restaurant, ask that your food be prepared with less salt or none, if possible. READING FOOD LABELS FOR SODIUM INFORMATION The nutrition facts label is a good place to find how much sodium is in foods. Look for products with no more than 500 to 600 mg of sodium per meal and no more than 150 mg per serving. Remember that 3 to 4 g = 3000 to 4000 mg. The food label may also list foods as:  Sodium-free: Less than 5 mg in a serving.  Very low sodium: 35 mg or less in a serving.  Low-sodium: 140 mg or less in a serving.  Light in sodium: 50% less sodium in a serving. For example, if a food that usually has 300 mg  of sodium is changed to become light in sodium, it will have 150 mg of sodium.  Reduced sodium: 25% less sodium in a serving. For example, if a food that usually has 400 mg of sodium is changed to reduced sodium, it will have 300 mg of sodium. CHOOSING FOODS Grains  Avoid: Salted crackers and snack items. Bread stuffing and biscuit mixes. Seasoned rice or pasta mixes.  Choose: Unsalted snack items. English muffins, breads, and rolls. Homemade pancakes and waffles. Most cereals. Pasta. Meats  Avoid: Salted, canned, smoked, spiced, pickled meats, including fish and poultry. Bacon, ham, sausage, cold cuts, hot dogs, anchovies.  Choose: Low-sodium canned tuna and salmon. Fresh or frozen meat, poultry, and fish. Dairy  Avoid: Processed cheese and spreads. Cottage cheese. Buttermilk and condensed milk. Regular cheese.  Choose: Milk. Low-sodium cottage cheese. Yogurt. Sour cream. Low-sodium cheese. Fruits and Vegetables  Avoid: Regular canned vegetables. Regular canned tomato sauce and paste. Frozen vegetables in sauces. Olives. Angie Fava. Relishes. Sauerkraut.  Choose: Low-sodium canned vegetables. Low-sodium tomato sauce and paste. Frozen or fresh vegetables. Fresh and frozen fruit. Condiments  Avoid: Canned and packaged gravies. Worcestershire sauce. Tartar sauce. Barbecue sauce. Soy sauce. Steak sauce. Ketchup. Onion, garlic, and table salt. Meat flavorings and tenderizers.  Choose: Fresh and dried herbs and spices.  Low-sodium varieties of mustard and ketchup. Lemon juice. Tabasco sauce. Horseradish. SAMPLE 3 TO 4 GRAM SODIUM MEAL PLAN  Breakfast / Sodium (mg)  1 cup low-fat milk / 096 mg  2 slices whole-wheat toast / 270 mg  1 tbs heart-healthy margarine / 153 mg  1 hard-boiled egg / 139 mg Lunch / Sodium (mg)  1 cup raw carrots / 76 mg   cup hummus / 298 mg  1 cup low-fat milk / 143 mg   cup red grapes / 2 mg  1 cup low-sodium chicken and rice soup / 480 mg  10  low-sodium saltine crackers / 191 mg Dinner / Sodium (mg)  1 cup whole-wheat pasta / 2 mg  1 cup tomato sauce / 1178 mg  3 oz lean ground beef / 57 mg  1 small side salad (1 cup raw spinach leaves,  cup cucumber,  cup yellow bell pepper) / 25 mg  1 tsp ranch dressing / 144 mg Snack / Sodium (mg)  1 slice cheddar cheese / 258 mg  1 medium apple / 1 mg Nutrient Analysis  Calories: 2005  Protein: 85 g  Carbohydrate: 245 g  Fat: 78 g  Sodium: 3560 mg Document Released: 06/27/2005 Document Revised: 09/19/2011 Document Reviewed: 09/28/2009 [facility] Patient Information 2013 [place], [place].

## 2012-07-06 NOTE — Progress Notes (Signed)
    PCP:  Foye Spurling, MD  The patient presents today for routine cardiology followup.  Since last being seen in our clinic, the patient reports doing very well.  Today, he denies symptoms of palpitations, chest pain, shortness of breath, orthopnea, PND, lower extremity edema, dizziness, presyncope, syncope, or neurologic sequela.  The patient feels that he is tolerating medications without difficulties and is otherwise without complaint today.   Past Medical History  Diagnosis Date  . A-fib     paroxysmal  . Hypertension   . 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  Current Outpatient Prescriptions  Medication Sig Dispense Refill  . Calcium Carbonate-Vitamin D (CALCIUM 600+D) 600-200 MG-UNIT TABS Take 1 tablet by mouth 2 (two) times daily.      Marland Kitchen diltiazem (CARDIZEM CD) 240 MG 24 hr capsule Take 240 mg by mouth daily.        . fexofenadine (ALLEGRA) 180 MG tablet Take 180 mg by mouth daily as needed.        . Naproxen Sodium (ALEVE) 220 MG CAPS Take 220 mg by mouth daily as needed.        Marland Kitchen olmesartan (BENICAR) 40 MG tablet Take 40 mg by mouth daily.        Marland Kitchen warfarin (COUMADIN) 5 MG tablet Take as directed by anticoagulation clinic  40 tablet  3  . hydrochlorothiazide (HYDRODIURIL) 25 MG tablet Take 0.5 tablets (12.5 mg total) by mouth daily.  45 tablet  2    No Known Allergies  History   Social History  . Marital Status: Married    Spouse Name: N/A    Number of Children: N/A  . Years of Education: N/A   Occupational History  . Not on file.   Social History Main Topics  . Smoking status: Former Smoker    Types: Cigarettes    Quit date: 09/09/1991  . Smokeless tobacco: Never Used  . Alcohol Use: Yes  . Drug Use: No  . Sexually Active: Yes -- Male partner(s)   Other Topics Concern  . Not on file   Social History Narrative  . No narrative on file    Family  History  Problem Relation Age of Onset  . Cancer Father     ROS-  All systems are reviewed and are negative except as outlined in the HPI above   Physical Exam: Filed Vitals:   07/06/12 0844  BP: 150/90  Pulse: 68  Height: 6' (1.829 m)  Weight: 192 lb (87.091 kg)    GEN- The patient is well appearing, alert and oriented x 3 today.   Head- normocephalic, atraumatic Eyes-  Sclera clear, conjunctiva pink Ears- hearing intact Oropharynx- clear Neck- supple, no JVP Lymph- no cervical lymphadenopathy Lungs- Clear to ausculation bilaterally, normal work of breathing Heart- Regular rate and rhythm, no murmurs, rubs or gallops, PMI not laterally displaced GI- soft, NT, ND, + BS Extremities- no clubbing, cyanosis, or edema MS- no significant deformity or atrophy Skin- no rash or lesion Psych- euthymic mood, full affect Neuro- strength and sensation are intact  ekg today reveals sinus rhythm 68 bpm, PR 154, QRS 84, Qtc 487, otheriwe normal ekg  Assessment and Plan:

## 2012-07-06 NOTE — Assessment & Plan Note (Signed)
Lipids are followed by Dr Carlis Abbott I have asked the patient to have them forwarded to me

## 2012-07-06 NOTE — Assessment & Plan Note (Signed)
Above goal Repeat by MD is 148/90 Check BMET Add hctz 12.38m daily

## 2012-07-13 ENCOUNTER — Telehealth: Payer: Self-pay | Admitting: Internal Medicine

## 2012-07-13 NOTE — Telephone Encounter (Signed)
Pt calling for blood test results

## 2012-07-13 NOTE — Telephone Encounter (Signed)
Patient aware of results.

## 2012-07-26 ENCOUNTER — Ambulatory Visit (INDEPENDENT_AMBULATORY_CARE_PROVIDER_SITE_OTHER): Payer: Medicare Other | Admitting: *Deleted

## 2012-07-26 DIAGNOSIS — I4891 Unspecified atrial fibrillation: Secondary | ICD-10-CM

## 2012-07-26 DIAGNOSIS — Z7901 Long term (current) use of anticoagulants: Secondary | ICD-10-CM

## 2012-07-26 DIAGNOSIS — I6350 Cerebral infarction due to unspecified occlusion or stenosis of unspecified cerebral artery: Secondary | ICD-10-CM

## 2012-07-26 LAB — POCT INR: INR: 2.7

## 2012-08-25 ENCOUNTER — Other Ambulatory Visit: Payer: Self-pay

## 2012-08-30 ENCOUNTER — Ambulatory Visit (INDEPENDENT_AMBULATORY_CARE_PROVIDER_SITE_OTHER): Payer: Medicare Other

## 2012-08-30 DIAGNOSIS — I4891 Unspecified atrial fibrillation: Secondary | ICD-10-CM

## 2012-08-30 DIAGNOSIS — Z7901 Long term (current) use of anticoagulants: Secondary | ICD-10-CM

## 2012-08-30 DIAGNOSIS — I6350 Cerebral infarction due to unspecified occlusion or stenosis of unspecified cerebral artery: Secondary | ICD-10-CM

## 2012-10-04 ENCOUNTER — Ambulatory Visit (INDEPENDENT_AMBULATORY_CARE_PROVIDER_SITE_OTHER): Payer: Medicare Other

## 2012-10-04 DIAGNOSIS — I6350 Cerebral infarction due to unspecified occlusion or stenosis of unspecified cerebral artery: Secondary | ICD-10-CM

## 2012-10-04 DIAGNOSIS — Z7901 Long term (current) use of anticoagulants: Secondary | ICD-10-CM

## 2012-10-04 DIAGNOSIS — I4891 Unspecified atrial fibrillation: Secondary | ICD-10-CM

## 2012-10-04 LAB — POCT INR: INR: 2.2

## 2012-10-17 ENCOUNTER — Other Ambulatory Visit: Payer: Self-pay | Admitting: *Deleted

## 2012-10-17 MED ORDER — WARFARIN SODIUM 5 MG PO TABS: ORAL_TABLET | ORAL | Status: DC

## 2012-11-15 ENCOUNTER — Ambulatory Visit (INDEPENDENT_AMBULATORY_CARE_PROVIDER_SITE_OTHER): Payer: Medicare Other

## 2012-11-15 DIAGNOSIS — I6350 Cerebral infarction due to unspecified occlusion or stenosis of unspecified cerebral artery: Secondary | ICD-10-CM

## 2012-11-15 DIAGNOSIS — I4891 Unspecified atrial fibrillation: Secondary | ICD-10-CM

## 2012-11-15 DIAGNOSIS — Z7901 Long term (current) use of anticoagulants: Secondary | ICD-10-CM

## 2012-11-15 LAB — POCT INR: INR: 2.2

## 2012-12-27 ENCOUNTER — Ambulatory Visit (INDEPENDENT_AMBULATORY_CARE_PROVIDER_SITE_OTHER): Payer: Medicare Other | Admitting: *Deleted

## 2012-12-27 DIAGNOSIS — I4891 Unspecified atrial fibrillation: Secondary | ICD-10-CM

## 2012-12-27 DIAGNOSIS — Z7901 Long term (current) use of anticoagulants: Secondary | ICD-10-CM

## 2012-12-27 DIAGNOSIS — I6350 Cerebral infarction due to unspecified occlusion or stenosis of unspecified cerebral artery: Secondary | ICD-10-CM

## 2013-01-11 ENCOUNTER — Encounter: Payer: Self-pay | Admitting: Internal Medicine

## 2013-02-07 ENCOUNTER — Ambulatory Visit (INDEPENDENT_AMBULATORY_CARE_PROVIDER_SITE_OTHER): Payer: Medicare Other | Admitting: *Deleted

## 2013-02-07 DIAGNOSIS — I6350 Cerebral infarction due to unspecified occlusion or stenosis of unspecified cerebral artery: Secondary | ICD-10-CM

## 2013-02-07 DIAGNOSIS — I4891 Unspecified atrial fibrillation: Secondary | ICD-10-CM

## 2013-02-07 DIAGNOSIS — Z7901 Long term (current) use of anticoagulants: Secondary | ICD-10-CM

## 2013-02-07 LAB — POCT INR: INR: 2.3

## 2013-02-13 ENCOUNTER — Other Ambulatory Visit: Payer: Self-pay

## 2013-03-21 ENCOUNTER — Ambulatory Visit (INDEPENDENT_AMBULATORY_CARE_PROVIDER_SITE_OTHER): Payer: Medicare Other | Admitting: *Deleted

## 2013-03-21 DIAGNOSIS — Z7901 Long term (current) use of anticoagulants: Secondary | ICD-10-CM

## 2013-03-21 DIAGNOSIS — I4891 Unspecified atrial fibrillation: Secondary | ICD-10-CM

## 2013-03-21 DIAGNOSIS — I6350 Cerebral infarction due to unspecified occlusion or stenosis of unspecified cerebral artery: Secondary | ICD-10-CM

## 2013-03-21 LAB — POCT INR: INR: 2.2

## 2013-04-04 ENCOUNTER — Other Ambulatory Visit: Payer: Self-pay | Admitting: Internal Medicine

## 2013-04-10 ENCOUNTER — Other Ambulatory Visit: Payer: Self-pay | Admitting: Internal Medicine

## 2013-05-02 ENCOUNTER — Ambulatory Visit (INDEPENDENT_AMBULATORY_CARE_PROVIDER_SITE_OTHER): Payer: Medicare Other | Admitting: *Deleted

## 2013-05-02 DIAGNOSIS — Z7901 Long term (current) use of anticoagulants: Secondary | ICD-10-CM

## 2013-05-02 DIAGNOSIS — I4891 Unspecified atrial fibrillation: Secondary | ICD-10-CM

## 2013-05-02 DIAGNOSIS — I6350 Cerebral infarction due to unspecified occlusion or stenosis of unspecified cerebral artery: Secondary | ICD-10-CM

## 2013-05-16 ENCOUNTER — Other Ambulatory Visit: Payer: Self-pay

## 2013-06-13 ENCOUNTER — Ambulatory Visit (INDEPENDENT_AMBULATORY_CARE_PROVIDER_SITE_OTHER): Payer: Medicare Other | Admitting: General Practice

## 2013-06-13 DIAGNOSIS — Z7901 Long term (current) use of anticoagulants: Secondary | ICD-10-CM

## 2013-06-13 DIAGNOSIS — I4891 Unspecified atrial fibrillation: Secondary | ICD-10-CM

## 2013-06-13 DIAGNOSIS — I6350 Cerebral infarction due to unspecified occlusion or stenosis of unspecified cerebral artery: Secondary | ICD-10-CM

## 2013-07-25 ENCOUNTER — Ambulatory Visit (INDEPENDENT_AMBULATORY_CARE_PROVIDER_SITE_OTHER): Payer: Medicare Other | Admitting: Pharmacist

## 2013-07-25 DIAGNOSIS — I4891 Unspecified atrial fibrillation: Secondary | ICD-10-CM

## 2013-07-25 DIAGNOSIS — Z7901 Long term (current) use of anticoagulants: Secondary | ICD-10-CM

## 2013-07-25 DIAGNOSIS — I6350 Cerebral infarction due to unspecified occlusion or stenosis of unspecified cerebral artery: Secondary | ICD-10-CM

## 2013-07-25 LAB — POCT INR: INR: 2.3

## 2013-08-14 ENCOUNTER — Encounter: Payer: Self-pay | Admitting: *Deleted

## 2013-08-14 ENCOUNTER — Encounter: Payer: Self-pay | Admitting: Internal Medicine

## 2013-08-14 ENCOUNTER — Ambulatory Visit (INDEPENDENT_AMBULATORY_CARE_PROVIDER_SITE_OTHER): Payer: Medicare Other | Admitting: Internal Medicine

## 2013-08-14 VITALS — BP 143/78 | HR 76 | Ht 71.0 in | Wt 191.0 lb

## 2013-08-14 DIAGNOSIS — I4891 Unspecified atrial fibrillation: Secondary | ICD-10-CM

## 2013-08-14 DIAGNOSIS — I6350 Cerebral infarction due to unspecified occlusion or stenosis of unspecified cerebral artery: Secondary | ICD-10-CM

## 2013-08-14 DIAGNOSIS — E785 Hyperlipidemia, unspecified: Secondary | ICD-10-CM

## 2013-08-14 DIAGNOSIS — I10 Essential (primary) hypertension: Secondary | ICD-10-CM

## 2013-08-14 DIAGNOSIS — Z7901 Long term (current) use of anticoagulants: Secondary | ICD-10-CM

## 2013-08-14 NOTE — Patient Instructions (Signed)
Your physician wants you to follow-up in: 12 months with Dr Vallery Ridge will receive a reminder letter in the mail two months in advance. If you don't receive a letter, please call our office to schedule the follow-up appointment.  Your physician recommends that you return for lab work at your convenience : Fasting lipid/liver and bmp

## 2013-08-14 NOTE — Addendum Note (Signed)
Addended by: Janan Halter F on: 08/14/2013 09:58 AM   Modules accepted: Orders

## 2013-08-14 NOTE — Addendum Note (Signed)
Addended by: Janan Halter F on: 08/14/2013 10:01 AM   Modules accepted: Orders

## 2013-08-14 NOTE — Progress Notes (Signed)
PCP:  Foye Spurling, MD  The patient presents today for routine cardiology followup.  Since last being seen in our clinic, the patient reports doing very well.  He had URI symptoms and was treated for the flu in early January.  His symptoms have resolved.  Today, he denies symptoms of palpitations, chest pain, shortness of breath, orthopnea, PND, lower extremity edema, dizziness, presyncope, syncope, or neurologic sequela.  The patient feels that he is tolerating medications without difficulties and is otherwise without complaint today.   Past Medical History  Diagnosis Date  . A-fib     paroxysmal  . Hypertension   . 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  Current Outpatient Prescriptions  Medication Sig Dispense Refill  . allopurinol (ZYLOPRIM) 100 MG tablet Take 100 mg by mouth daily.      . Calcium Carbonate-Vitamin D (CALCIUM 600+D) 600-200 MG-UNIT TABS Take 1 tablet by mouth 2 (two) times daily.      Marland Kitchen diltiazem (CARDIZEM CD) 240 MG 24 hr capsule Take 240 mg by mouth daily.        . fexofenadine (ALLEGRA) 180 MG tablet Take 180 mg by mouth daily as needed.        . Naproxen Sodium (ALEVE) 220 MG CAPS Take 220 mg by mouth daily as needed.        Marland Kitchen olmesartan (BENICAR) 40 MG tablet Take 40 mg by mouth daily.        Marland Kitchen telmisartan-hydrochlorothiazide (MICARDIS HCT) 40-12.5 MG per tablet Take 1 tablet by mouth daily.      Marland Kitchen warfarin (COUMADIN) 5 MG tablet TAKE AS DIRECTED BY ANTICOAGULATION CLINIC  40 tablet  3   No current facility-administered medications for this visit.    No Known Allergies  History   Social History  . Marital Status: Married    Spouse Name: N/A    Number of Children: N/A  . Years of Education: N/A   Occupational History  . Not on file.   Social History Main Topics  . Smoking status: Former Smoker    Types: Cigarettes    Quit date: 09/09/1991  .  Smokeless tobacco: Never Used  . Alcohol Use: Yes  . Drug Use: No  . Sexual Activity: Yes    Partners: Female   Other Topics Concern  . Not on file   Social History Narrative  . No narrative on file    Family History  Problem Relation Age of Onset  . Cancer Father     ROS-  All systems are reviewed and are negative except as outlined in the HPI above   Physical Exam: Filed Vitals:   08/14/13 0906  BP: 143/78  Pulse: 76  Height: 5' 11" (1.803 m)  Weight: 191 lb (86.637 kg)    GEN- The patient is well appearing, alert and oriented x 3 today.   Head- normocephalic, atraumatic Eyes-  Sclera clear, conjunctiva pink Ears- hearing intact Oropharynx- clear Neck- supple, no JVP Lymph- no cervical lymphadenopathy Lungs- Clear to ausculation bilaterally, normal work of breathing Heart- Regular rate and rhythm, no murmurs, rubs or gallops, PMI not laterally displaced GI- soft, NT, ND, + BS Extremities- no clubbing, cyanosis, or edema MS- no significant deformity or atrophy Skin- no rash or lesion Psych- euthymic mood, full affect Neuro- strength and sensation are intact  ekg today reveals sinus rhythm  76 bpm, nonspecific St/T changes, Qtc 490  Assessment and Plan:  1. afib Well controlled No symptomatic recurrence since last year chads2vasc score is at least 4.  Continue coumadin as recent INRs have been very stable  2. htn Slightly elevated 2 gram sodium restriction Check bmet, fasting lipids and LFTs  3. Prior stroke Continue long term anticoagulation  Return in 1 year

## 2013-08-19 ENCOUNTER — Other Ambulatory Visit (INDEPENDENT_AMBULATORY_CARE_PROVIDER_SITE_OTHER): Payer: Medicare Other

## 2013-08-19 DIAGNOSIS — E785 Hyperlipidemia, unspecified: Secondary | ICD-10-CM

## 2013-08-19 DIAGNOSIS — I10 Essential (primary) hypertension: Secondary | ICD-10-CM

## 2013-08-19 LAB — LIPID PANEL
CHOLESTEROL: 176 mg/dL (ref 0–200)
HDL: 42 mg/dL (ref >39.00)
LDL Cholesterol: 121 mg/dL — ABNORMAL HIGH (ref 0–99)
TRIGLYCERIDES: 66 mg/dL (ref 0.0–149.0)
Total CHOL/HDL Ratio: 4
VLDL: 13.2 mg/dL (ref 0.0–40.0)

## 2013-08-19 LAB — BASIC METABOLIC PANEL
BUN: 18 mg/dL (ref 6–23)
CALCIUM: 9.1 mg/dL (ref 8.4–10.5)
CO2: 26 mEq/L (ref 19–32)
CREATININE: 1.2 mg/dL (ref 0.4–1.5)
Chloride: 105 mEq/L (ref 96–112)
GFR: 73.73 mL/min (ref >60.00)
Glucose, Bld: 78 mg/dL (ref 70–99)
Potassium: 3.6 mEq/L (ref 3.5–5.1)
SODIUM: 139 meq/L (ref 135–145)

## 2013-08-19 LAB — HEPATIC FUNCTION PANEL
ALK PHOS: 43 U/L (ref 39–117)
ALT: 11 U/L (ref 0–53)
AST: 14 U/L (ref 0–37)
Albumin: 3.9 g/dL (ref 3.5–5.2)
Bilirubin, Direct: 0 mg/dL (ref 0.0–0.3)
TOTAL PROTEIN: 7.3 g/dL (ref 6.0–8.3)
Total Bilirubin: 0.9 mg/dL (ref 0.3–1.2)

## 2013-09-04 ENCOUNTER — Ambulatory Visit (INDEPENDENT_AMBULATORY_CARE_PROVIDER_SITE_OTHER): Payer: Medicare Other | Admitting: Pharmacist

## 2013-09-04 DIAGNOSIS — Z7901 Long term (current) use of anticoagulants: Secondary | ICD-10-CM

## 2013-09-04 DIAGNOSIS — I4891 Unspecified atrial fibrillation: Secondary | ICD-10-CM

## 2013-09-04 DIAGNOSIS — I6350 Cerebral infarction due to unspecified occlusion or stenosis of unspecified cerebral artery: Secondary | ICD-10-CM

## 2013-09-04 LAB — POCT INR: INR: 2.6

## 2013-09-19 NOTE — Progress Notes (Signed)
Patient ID: Patrick Weiss, male   DOB: 1941-05-25, 73 y.o.   MRN: [id number] Pt presented to office today and wanted [place] to update medication list.  There was an error on the list, Benicar 65m he is no longer taking it has been changed to Micardis 40/12.5.  Medication list updated to reflect this.

## 2013-09-21 ENCOUNTER — Other Ambulatory Visit: Payer: Self-pay | Admitting: Internal Medicine

## 2013-10-16 ENCOUNTER — Ambulatory Visit (INDEPENDENT_AMBULATORY_CARE_PROVIDER_SITE_OTHER): Payer: Medicare Other | Admitting: Pharmacist

## 2013-10-16 DIAGNOSIS — I6350 Cerebral infarction due to unspecified occlusion or stenosis of unspecified cerebral artery: Secondary | ICD-10-CM

## 2013-10-16 DIAGNOSIS — Z7901 Long term (current) use of anticoagulants: Secondary | ICD-10-CM

## 2013-10-16 DIAGNOSIS — I4891 Unspecified atrial fibrillation: Secondary | ICD-10-CM

## 2013-10-16 LAB — POCT INR: INR: 1.5

## 2013-11-06 ENCOUNTER — Ambulatory Visit (INDEPENDENT_AMBULATORY_CARE_PROVIDER_SITE_OTHER): Payer: Medicare Other | Admitting: *Deleted

## 2013-11-06 DIAGNOSIS — I6350 Cerebral infarction due to unspecified occlusion or stenosis of unspecified cerebral artery: Secondary | ICD-10-CM

## 2013-11-06 DIAGNOSIS — I4891 Unspecified atrial fibrillation: Secondary | ICD-10-CM

## 2013-11-06 DIAGNOSIS — Z7901 Long term (current) use of anticoagulants: Secondary | ICD-10-CM

## 2013-11-06 LAB — POCT INR: INR: 2

## 2013-11-28 ENCOUNTER — Ambulatory Visit (INDEPENDENT_AMBULATORY_CARE_PROVIDER_SITE_OTHER): Payer: Medicare Other | Admitting: Pharmacist Clinician (PhC)/ Clinical Pharmacy Specialist

## 2013-11-28 DIAGNOSIS — I4891 Unspecified atrial fibrillation: Secondary | ICD-10-CM

## 2013-11-28 DIAGNOSIS — I6350 Cerebral infarction due to unspecified occlusion or stenosis of unspecified cerebral artery: Secondary | ICD-10-CM

## 2013-11-28 DIAGNOSIS — Z7901 Long term (current) use of anticoagulants: Secondary | ICD-10-CM

## 2013-11-28 LAB — POCT INR: INR: 2.9

## 2013-12-26 ENCOUNTER — Ambulatory Visit (INDEPENDENT_AMBULATORY_CARE_PROVIDER_SITE_OTHER): Payer: Medicare Other

## 2013-12-26 DIAGNOSIS — I6350 Cerebral infarction due to unspecified occlusion or stenosis of unspecified cerebral artery: Secondary | ICD-10-CM

## 2013-12-26 DIAGNOSIS — I4891 Unspecified atrial fibrillation: Secondary | ICD-10-CM

## 2013-12-26 DIAGNOSIS — Z7901 Long term (current) use of anticoagulants: Secondary | ICD-10-CM

## 2013-12-26 LAB — POCT INR: INR: 3.7

## 2014-01-16 ENCOUNTER — Telehealth: Payer: Self-pay | Admitting: Internal Medicine

## 2014-01-16 ENCOUNTER — Ambulatory Visit (INDEPENDENT_AMBULATORY_CARE_PROVIDER_SITE_OTHER): Payer: Medicare Other | Admitting: *Deleted

## 2014-01-16 DIAGNOSIS — I6350 Cerebral infarction due to unspecified occlusion or stenosis of unspecified cerebral artery: Secondary | ICD-10-CM

## 2014-01-16 DIAGNOSIS — Z7901 Long term (current) use of anticoagulants: Secondary | ICD-10-CM

## 2014-01-16 DIAGNOSIS — I4891 Unspecified atrial fibrillation: Secondary | ICD-10-CM

## 2014-01-16 LAB — POCT INR: INR: 2.3

## 2014-01-16 NOTE — Telephone Encounter (Signed)
Claiborne Billings, have you guys received this yet?

## 2014-01-16 NOTE — Telephone Encounter (Signed)
As of today, there was no request in my folder.  Given his prior stroke, I would like avoid discontinuation of anticoagulation.  Ideally, colonoscopy could be done on coumadin.  If this is not an option then lovenox bridge would be required. Patrick Weiss can you assist with these recommendations.

## 2014-01-16 NOTE — Telephone Encounter (Signed)
New message     Did you get the clearance to hold warfarin so that pt can have a colonoscopy?  Form was faxed 01-03-14 and they have not heard back from [place]. Please call.

## 2014-01-16 NOTE — Telephone Encounter (Signed)
Spoke with office explaining that Dr. Jackalyn Lombard nurse is out of the office this week and we have not been able to locate clearance request form. Requested they fax again, today, and we will have Dr. Rayann Heman address this.

## 2014-01-17 NOTE — Telephone Encounter (Signed)
Called and spoke with Larene Beach at [facility] and advised her of Dr. Jackalyn Lombard recommendation regarding patient's colonoscopy.  Larene Beach states that she continues to try to fax the form as they would still like this filled out and faxed back to them with Dr. Jackalyn Lombard advice and signature.  I advised Larene Beach to continue try to refax to my attention now and if received I will place in Dr. Jackalyn Lombard mailbox.  Patient's procedure is scheduled for August.  I advised Larene Beach that I am forwarding message to Dr. Rayann Heman and his primary nurse, Janan Halter, RN for f/u when they return to the office.  Larene Beach verbalized understanding and agreement.

## 2014-01-21 ENCOUNTER — Telehealth: Payer: Self-pay | Admitting: Internal Medicine

## 2014-01-21 NOTE — Telephone Encounter (Signed)
Spoke with pt and informed him that I will follow up to see if clearance form was received in office.

## 2014-01-21 NOTE — Telephone Encounter (Signed)
New message     Patient request to talk to [facility]

## 2014-01-30 ENCOUNTER — Other Ambulatory Visit: Payer: Self-pay | Admitting: Gastroenterology

## 2014-01-30 DIAGNOSIS — Z1211 Encounter for screening for malignant neoplasm of colon: Secondary | ICD-10-CM

## 2014-01-31 ENCOUNTER — Telehealth: Payer: Self-pay | Admitting: Internal Medicine

## 2014-01-31 NOTE — Telephone Encounter (Signed)
New message     FYI Pt will have a virtual colonscopy on aug 11 instead of a regular colonscopy.  It was approved by [employer].  Wanted Dr Rayann Heman to know.

## 2014-02-06 ENCOUNTER — Ambulatory Visit (INDEPENDENT_AMBULATORY_CARE_PROVIDER_SITE_OTHER): Payer: Medicare Other | Admitting: *Deleted

## 2014-02-06 DIAGNOSIS — Z7901 Long term (current) use of anticoagulants: Secondary | ICD-10-CM

## 2014-02-06 DIAGNOSIS — I6350 Cerebral infarction due to unspecified occlusion or stenosis of unspecified cerebral artery: Secondary | ICD-10-CM

## 2014-02-06 DIAGNOSIS — I4891 Unspecified atrial fibrillation: Secondary | ICD-10-CM

## 2014-02-06 LAB — POCT INR: INR: 3.4

## 2014-02-09 ENCOUNTER — Other Ambulatory Visit: Payer: Self-pay | Admitting: Internal Medicine

## 2014-02-18 ENCOUNTER — Ambulatory Visit
Admission: RE | Admit: 2014-02-18 | Discharge: 2014-02-18 | Disposition: A | Discharge status: Home / Self Care | Payer: Medicare Other | Source: Ambulatory Visit | Attending: Gastroenterology | Admitting: Gastroenterology

## 2014-02-18 DIAGNOSIS — Z1211 Encounter for screening for malignant neoplasm of colon: Secondary | ICD-10-CM

## 2014-02-20 ENCOUNTER — Ambulatory Visit (INDEPENDENT_AMBULATORY_CARE_PROVIDER_SITE_OTHER): Payer: Medicare Other | Admitting: *Deleted

## 2014-02-20 DIAGNOSIS — I6350 Cerebral infarction due to unspecified occlusion or stenosis of unspecified cerebral artery: Secondary | ICD-10-CM

## 2014-02-20 DIAGNOSIS — I4891 Unspecified atrial fibrillation: Secondary | ICD-10-CM

## 2014-02-20 DIAGNOSIS — Z7901 Long term (current) use of anticoagulants: Secondary | ICD-10-CM

## 2014-02-20 LAB — POCT INR: INR: 2.7

## 2014-03-13 ENCOUNTER — Ambulatory Visit (INDEPENDENT_AMBULATORY_CARE_PROVIDER_SITE_OTHER): Payer: Medicare Other | Admitting: *Deleted

## 2014-03-13 DIAGNOSIS — I6350 Cerebral infarction due to unspecified occlusion or stenosis of unspecified cerebral artery: Secondary | ICD-10-CM

## 2014-03-13 DIAGNOSIS — I4891 Unspecified atrial fibrillation: Secondary | ICD-10-CM

## 2014-03-13 DIAGNOSIS — Z7901 Long term (current) use of anticoagulants: Secondary | ICD-10-CM

## 2014-03-13 LAB — POCT INR: INR: 1.8

## 2014-03-27 ENCOUNTER — Ambulatory Visit (INDEPENDENT_AMBULATORY_CARE_PROVIDER_SITE_OTHER): Payer: Medicare Other

## 2014-03-27 DIAGNOSIS — I6350 Cerebral infarction due to unspecified occlusion or stenosis of unspecified cerebral artery: Secondary | ICD-10-CM

## 2014-03-27 DIAGNOSIS — I4891 Unspecified atrial fibrillation: Secondary | ICD-10-CM

## 2014-03-27 DIAGNOSIS — Z7901 Long term (current) use of anticoagulants: Secondary | ICD-10-CM

## 2014-03-27 LAB — POCT INR: INR: 2.4

## 2014-04-17 ENCOUNTER — Ambulatory Visit (INDEPENDENT_AMBULATORY_CARE_PROVIDER_SITE_OTHER): Payer: Medicare Other

## 2014-04-17 DIAGNOSIS — I639 Cerebral infarction, unspecified: Secondary | ICD-10-CM

## 2014-04-17 DIAGNOSIS — I4891 Unspecified atrial fibrillation: Secondary | ICD-10-CM

## 2014-04-17 DIAGNOSIS — I6350 Cerebral infarction due to unspecified occlusion or stenosis of unspecified cerebral artery: Secondary | ICD-10-CM

## 2014-04-17 DIAGNOSIS — Z7901 Long term (current) use of anticoagulants: Secondary | ICD-10-CM

## 2014-04-17 LAB — POCT INR: INR: 2.6

## 2014-05-15 ENCOUNTER — Ambulatory Visit (INDEPENDENT_AMBULATORY_CARE_PROVIDER_SITE_OTHER): Payer: Medicare Other

## 2014-05-15 DIAGNOSIS — Z7901 Long term (current) use of anticoagulants: Secondary | ICD-10-CM

## 2014-05-15 DIAGNOSIS — I6350 Cerebral infarction due to unspecified occlusion or stenosis of unspecified cerebral artery: Secondary | ICD-10-CM

## 2014-05-15 DIAGNOSIS — I4891 Unspecified atrial fibrillation: Secondary | ICD-10-CM

## 2014-05-15 DIAGNOSIS — I639 Cerebral infarction, unspecified: Secondary | ICD-10-CM

## 2014-05-15 LAB — POCT INR: INR: 2.1

## 2014-06-26 ENCOUNTER — Ambulatory Visit (INDEPENDENT_AMBULATORY_CARE_PROVIDER_SITE_OTHER): Payer: Medicare Other

## 2014-06-26 DIAGNOSIS — I6350 Cerebral infarction due to unspecified occlusion or stenosis of unspecified cerebral artery: Secondary | ICD-10-CM

## 2014-06-26 DIAGNOSIS — I4891 Unspecified atrial fibrillation: Secondary | ICD-10-CM

## 2014-06-26 DIAGNOSIS — I639 Cerebral infarction, unspecified: Secondary | ICD-10-CM

## 2014-06-26 DIAGNOSIS — Z7901 Long term (current) use of anticoagulants: Secondary | ICD-10-CM

## 2014-06-26 LAB — POCT INR: INR: 1.6

## 2014-07-24 ENCOUNTER — Ambulatory Visit (INDEPENDENT_AMBULATORY_CARE_PROVIDER_SITE_OTHER): Payer: Medicare Other | Admitting: Pharmacist Clinician (PhC)/ Clinical Pharmacy Specialist

## 2014-07-24 DIAGNOSIS — I639 Cerebral infarction, unspecified: Secondary | ICD-10-CM

## 2014-07-24 DIAGNOSIS — Z7901 Long term (current) use of anticoagulants: Secondary | ICD-10-CM

## 2014-07-24 DIAGNOSIS — I6350 Cerebral infarction due to unspecified occlusion or stenosis of unspecified cerebral artery: Secondary | ICD-10-CM

## 2014-07-24 DIAGNOSIS — I4891 Unspecified atrial fibrillation: Secondary | ICD-10-CM

## 2014-07-24 LAB — POCT INR: INR: 2.6

## 2014-08-20 ENCOUNTER — Ambulatory Visit (INDEPENDENT_AMBULATORY_CARE_PROVIDER_SITE_OTHER): Payer: Medicare Other | Admitting: Pharmacist

## 2014-08-20 ENCOUNTER — Encounter: Payer: Self-pay | Admitting: Internal Medicine

## 2014-08-20 ENCOUNTER — Ambulatory Visit (INDEPENDENT_AMBULATORY_CARE_PROVIDER_SITE_OTHER): Payer: Medicare Other | Admitting: Internal Medicine

## 2014-08-20 VITALS — BP 138/80 | HR 64 | Ht 71.0 in | Wt 186.6 lb

## 2014-08-20 DIAGNOSIS — I639 Cerebral infarction, unspecified: Secondary | ICD-10-CM

## 2014-08-20 DIAGNOSIS — I4891 Unspecified atrial fibrillation: Secondary | ICD-10-CM

## 2014-08-20 DIAGNOSIS — I10 Essential (primary) hypertension: Secondary | ICD-10-CM

## 2014-08-20 DIAGNOSIS — Z7901 Long term (current) use of anticoagulants: Secondary | ICD-10-CM

## 2014-08-20 DIAGNOSIS — R0602 Shortness of breath: Secondary | ICD-10-CM

## 2014-08-20 DIAGNOSIS — I6350 Cerebral infarction due to unspecified occlusion or stenosis of unspecified cerebral artery: Secondary | ICD-10-CM

## 2014-08-20 DIAGNOSIS — I480 Paroxysmal atrial fibrillation: Secondary | ICD-10-CM

## 2014-08-20 LAB — POCT INR: INR: 2.1

## 2014-08-20 NOTE — Progress Notes (Signed)
PCP:  Foye Spurling, MD  The patient presents today for routine cardiology followup.  Since last being seen in our clinic, the patient reports doing very well, no awareness of irregular rhythm. EKG shows sinus rhythm with some new st/t wave changes.. He does have some concerns with progressive shortness of breath with exertional activities. He has always had some shortness of breath, but feels symptoms are worse over the last few months. He just had a virtual colonoscopy that did not show any GI concerns, but did show some renal cysts, for which he has had f/u, also some evidence for pulmonary fibrosis. He also describes some swallowing difficulties, food hanging up, that he did not mention to the GI doctor. Is on warfarin without bleeding issues.  Today, he denies symptoms of palpitations, chest pain, orthopnea, PND, lower extremity edema, dizziness, presyncope, syncope, or neurologic sequela.  The patient feels that he is tolerating medications without difficulties and is otherwise without complaint today.   Past Medical History  Diagnosis Date  . A-fib     paroxysmal  . Hypertension   . 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  Current Outpatient Prescriptions  Medication Sig Dispense Refill  . allopurinol (ZYLOPRIM) 100 MG tablet Take 100 mg by mouth daily.    . Calcium Carbonate-Vitamin D (CALCIUM 600+D) 600-200 MG-UNIT TABS Take 1 tablet by mouth 2 (two) times daily.    Marland Kitchen diltiazem (CARDIZEM CD) 240 MG 24 hr capsule Take 240 mg by mouth daily.      . fexofenadine (ALLEGRA) 180 MG tablet Take 180 mg by mouth daily.     . Naproxen Sodium (ALEVE) 220 MG CAPS Take 220 mg by mouth daily as needed (pain).     . valsartan-hydrochlorothiazide (DIOVAN-HCT) 320-12.5 MG per tablet Take 1 tablet by mouth daily.    Marland Kitchen warfarin (COUMADIN) 5 MG tablet TAKE AS DIRECTED 120 tablet 1   No current  facility-administered medications for this visit.    No Known Allergies  History   Social History  . Marital Status: Married    Spouse Name: N/A  . Number of Children: N/A  . Years of Education: N/A   Occupational History  . Not on file.   Social History Main Topics  . Smoking status: Former Smoker    Types: Cigarettes    Quit date: 09/09/1991  . Smokeless tobacco: Never Used  . Alcohol Use: Yes  . Drug Use: No  . Sexual Activity:    Partners: Female   Other Topics Concern  . Not on file   Social History Narrative    Family History  Problem Relation Age of Onset  . Cancer Father     ROS-  All systems are reviewed and are negative except as outlined in the HPI above   Physical Exam: Filed Vitals:   08/20/14 1054  BP: 138/80  Pulse: 64  Height: _0  (1.803 m)  Weight: 186 lb 9.6 oz (84.641 kg)    GEN- The patient is well appearing, alert and oriented x 3 today.   Head- normocephalic, atraumatic Eyes-  Sclera clear, conjunctiva pink Ears- hearing intact Oropharynx- clear Neck- supple, no JVP Lymph- no cervical lymphadenopathy Lungs- Clear to ausculation bilaterally, normal work of breathing Heart- Regular rate and rhythm, no murmurs, rubs or gallops, PMI not laterally displaced GI- soft, NT, ND, + BS Extremities- no clubbing,  cyanosis, or edema MS- no significant deformity or atrophy Skin- no rash or lesion Psych- euthymic mood, full affect Neuro- strength and sensation are intact  ekg today reveals sinus rhythm 63 bpm, nonspecific St/T changes inf/lat changes, Qtc 452 ms.  Assessment and Plan:  1. afib Well controlled No symptomatic recurrence since last year chads2vasc score is at least 4.  Continue coumadin as recent INRs have been very stable  2. htn Stable 2 gram sodium restriction   3. Prior stroke Continue long term anticoagulation  4. Progressive dyspnea.  Concerning Echo to evaluate for structural heart change Stress Myoview  to evaluate for ischemia PFT's to better evaulate SOB (particulalarly given fibrotic concerns noticed on virtual colonoscopy)  F/u Afib clinic in 6 weeks to follow-up on above I will see again in 1 year

## 2014-08-20 NOTE — Progress Notes (Deleted)
PCP:  Foye Spurling, MD  The patient presents today for routine cardiology followup.  Since last being seen in our clinic, the patient reports doing very well, no awareness of irregular rhythm. EKG shows sinus rhythm with some new st/t wave changes.. He does have some concerns with progressive shortness of breath with exertional activities. He has always had some shortness of breath, but feels symptoms are worse over the last few months. He just had a virtual colonoscopy that did not show any GI concerns, but did show some renal cysts, for which he has had f/u, also some evidence for pulmonary fibrosis. He also describes some swallowing difficulties, food hanging up, that he did not mention to the GI doctor. Is on warfarin without bleeding issues.  Today, he denies symptoms of palpitations, chest pain, orthopnea, PND, lower extremity edema, dizziness, presyncope, syncope, or neurologic sequela.  The patient feels that he is tolerating medications without difficulties and is otherwise without complaint today.   Past Medical History  Diagnosis Date  . A-fib     paroxysmal  . Hypertension   . 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  Current Outpatient Prescriptions  Medication Sig Dispense Refill  . allopurinol (ZYLOPRIM) 100 MG tablet Take 100 mg by mouth daily.    . Calcium Carbonate-Vitamin D (CALCIUM 600+D) 600-200 MG-UNIT TABS Take 1 tablet by mouth 2 (two) times daily.    Marland Kitchen diltiazem (CARDIZEM CD) 240 MG 24 hr capsule Take 240 mg by mouth daily.      . fexofenadine (ALLEGRA) 180 MG tablet Take 180 mg by mouth daily.     . Naproxen Sodium (ALEVE) 220 MG CAPS Take 220 mg by mouth daily as needed (pain).     . valsartan-hydrochlorothiazide (DIOVAN-HCT) 320-12.5 MG per tablet Take 1 tablet by mouth daily.    Marland Kitchen warfarin (COUMADIN) 5 MG tablet TAKE AS DIRECTED 120 tablet 1   No current  facility-administered medications for this visit.    No Known Allergies  History   Social History  . Marital Status: Married    Spouse Name: N/A  . Number of Children: N/A  . Years of Education: N/A   Occupational History  . Not on file.   Social History Main Topics  . Smoking status: Former Smoker    Types: Cigarettes    Quit date: 09/09/1991  . Smokeless tobacco: Never Used  . Alcohol Use: Yes  . Drug Use: No  . Sexual Activity:    Partners: Female   Other Topics Concern  . Not on file   Social History Narrative    Family History  Problem Relation Age of Onset  . Cancer Father     ROS-  All systems are reviewed and are negative except as outlined in the HPI above   Physical Exam: Filed Vitals:   08/20/14 1054  BP: 138/80  Pulse: 64  Height: _0  (1.803 m)  Weight: 186 lb 9.6 oz (84.641 kg)    GEN- The patient is well appearing, alert and oriented x 3 today.   Head- normocephalic, atraumatic Eyes-  Sclera clear, conjunctiva pink Ears- hearing intact Oropharynx- clear Neck- supple, no JVP Lymph- no cervical lymphadenopathy Lungs- Clear to ausculation bilaterally, normal work of breathing Heart- Regular rate and rhythm, no murmurs, rubs or gallops, PMI not laterally displaced GI- soft, NT, ND, + BS Extremities- no clubbing,  cyanosis, or edema MS- no significant deformity or atrophy Skin- no rash or lesion Psych- euthymic mood, full affect Neuro- strength and sensation are intact  ekg today reveals sinus rhythm 63 bpm, nonspecific St/T changes inf/lat changes, Qtc 452 ms.  Assessment and Plan:  1. afib Well controlled No symptomatic recurrence since last year chads2vasc score is at least 4.  Continue coumadin as recent INRs have been very stable  2. htn Stable 2 gram sodium restriction   3. Prior stroke Continue long term anticoagulation  4. Progressive dyspnea Echo Stress Myoview PFT's  F/u Afib clinic in 6 weeks/ Dr. Rayann Heman in  one year

## 2014-08-20 NOTE — Patient Instructions (Signed)
Your physician recommends that you schedule a follow-up appointment in: 6 weeks with Roderic Palau, NP and 12 months with Dr Rayann Heman  Your physician has requested that you have an echocardiogram. Echocardiography is a painless test that uses sound waves to create images of your heart. It provides your doctor with information about the size and shape of your heart and how well your heart's chambers and valves are working. This procedure takes approximately one hour. There are no restrictions for this procedure.  Your physician has requested that you have en exercise stress myoview. For further information please visit [email or web address]. Please follow instruction sheet, as given.  Your physician has recommended that you have a pulmonary function test. Pulmonary Function Tests are a group of tests that measure how well air moves in and out of your lungs.

## 2014-08-25 ENCOUNTER — Ambulatory Visit (HOSPITAL_COMMUNITY): Payer: Medicare Other

## 2014-08-29 ENCOUNTER — Ambulatory Visit (HOSPITAL_COMMUNITY): Payer: Medicare Other | Attending: Cardiovascular Disease | Admitting: Radiology

## 2014-08-29 ENCOUNTER — Ambulatory Visit (HOSPITAL_BASED_OUTPATIENT_CLINIC_OR_DEPARTMENT_OTHER): Payer: Medicare Other | Admitting: Radiology

## 2014-08-29 DIAGNOSIS — R0602 Shortness of breath: Secondary | ICD-10-CM

## 2014-08-29 DIAGNOSIS — I480 Paroxysmal atrial fibrillation: Secondary | ICD-10-CM

## 2014-08-29 DIAGNOSIS — R0609 Other forms of dyspnea: Secondary | ICD-10-CM | POA: Insufficient documentation

## 2014-08-29 DIAGNOSIS — I10 Essential (primary) hypertension: Secondary | ICD-10-CM | POA: Diagnosis not present

## 2014-08-29 MED ORDER — TECHNETIUM TC 99M SESTAMIBI GENERIC - CARDIOLITE
11.0000 | Freq: Once | INTRAVENOUS | Status: AC | PRN
  Administered 2014-08-29: 11 via INTRAVENOUS

## 2014-08-29 MED ORDER — TECHNETIUM TC 99M SESTAMIBI GENERIC - CARDIOLITE
33.0000 | Freq: Once | INTRAVENOUS | Status: AC | PRN
  Administered 2014-08-29: 33 via INTRAVENOUS

## 2014-08-29 NOTE — Progress Notes (Signed)
Echocardiogram performed.  

## 2014-08-29 NOTE — Progress Notes (Signed)
[facility] [address] [place], [place] [address] [phone]    Cardiology Nuclear Med Study  Patrick Weiss is a 74 y.o. male     MRN : [id number]     DOB: 1941-05-10  Procedure Date: 08/29/2014  Nuclear Med Background Indication for Stress Test:  Evaluation for Ischemia History:  2011 MPI EF 50-55% Cardiac Risk Factors: Hypertension  Symptoms:  DOE   Nuclear Pre-Procedure Caffeine/Decaff Intake:  None NPO After: 7:00pm   Lungs:  clear O2 Sat: 92% on room air. IV 0.9% NS with Angio Cath:  22g  IV Site: R Hand  IV Started by:  Crissie Figures, RN  Chest Size (in):  44 Cup Size: n/a  Height: _0  (1.803 m)  Weight:  186 lb (84.369 kg)  BMI:  Body mass index is 25.95 kg/(m^2). Tech Comments:  N/A    Nuclear Med Study 1 or 2 day study: 1 day  Stress Test Type:  Stress  Reading MD: N/A  Order Authorizing Provider:  Thompson Grayer, MD  Resting Radionuclide: Technetium 66mSestamibi  Resting Radionuclide Dose: 11.0 mCi   Stress Radionuclide:  Technetium 963mestamibi  Stress Radionuclide Dose: 33.0 mCi           Stress Protocol Rest HR: 60 Stress HR: 146  Rest BP: 149/102 Stress BP: 198/105  Exercise Time (min): 4:16 METS: 4.6   Predicted Max HR: 148 bpm % Max HR: 98.65 bpm Rate Pressure Product: 28908   Dose of Adenosine (mg):  n/a Dose of Lexiscan: n/a mg  Dose of Atropine (mg): n/a Dose of Dobutamine: n/a mcg/kg/min (at max HR)  Stress Test Technologist: JaCrissie FiguresRN  Nuclear Technologist:  ElEarl ManyCNMT     Rest Procedure:  Myocardial perfusion imaging was performed at rest 45 minutes following the intravenous administration of Technetium 9949mstamibi. Rest ECG: Normal sinus rhythm. Nonspecific ST-T wave changes.  Stress Procedure:  The patient exercised on the treadmill utilizing the Bruce Protocol for 4:16 minutes. The patient stopped due to extreme dyspnea and denied any chest pain.  Technetium 1m98mtamibi was  injected at peak exercise and myocardial perfusion imaging was performed after a brief delay. Stress ECG: No significant change from baseline ECG  QPS Raw Data Images:  Normal; no motion artifact; normal heart/lung ratio. Stress Images:  Normal homogeneous uptake in all areas of the myocardium. Rest Images:  Normal homogeneous uptake in all areas of the myocardium. Subtraction (SDS):  No evidence of ischemia. Transient Ischemic Dilatation (Normal <1.22):  0.94 Lung/Heart Ratio (Normal <0.45):  0.27  Quantitative Gated Spect Images QGS EDV:  144 ml QGS ESV:  85 ml  Impression Exercise Capacity:  There is decreased exercise capacity with extreme shortness of breath early in stress. There was a decrease in O2 saturation with walking as low as 81% BP Response:  Normal blood pressure response. Clinical Symptoms:  Severe shortness of breath with walking ECG Impression:  No significant ST segment change suggestive of ischemia. Comparison with Prior Nuclear Study: No images to compare  Overall Impression:  The study is abnormal. It is a low risk scan. There is no definite scar or ischemia. There is mild left ventricular dysfunction. The patient had limited exercise tolerance. There was significant decrease in O2 saturation and marked dyspnea with exercise.  LV Ejection Fraction: 41%.  LV Wall Motion:  There is mild global hypokinesis.  JeffDaryel November

## 2014-09-09 ENCOUNTER — Ambulatory Visit (INDEPENDENT_AMBULATORY_CARE_PROVIDER_SITE_OTHER): Payer: Medicare Other | Admitting: Internal Medicine

## 2014-09-09 DIAGNOSIS — I480 Paroxysmal atrial fibrillation: Secondary | ICD-10-CM

## 2014-09-09 DIAGNOSIS — R0602 Shortness of breath: Secondary | ICD-10-CM

## 2014-09-09 LAB — PULMONARY FUNCTION TEST
DL/VA % pred: 47 %
DL/VA: 2.2 ml/min/mmHg/L
DLCO UNC % PRED: 29 %
DLCO unc: 9.46 ml/min/mmHg
FEF 25-75 POST: 1.26 L/s
FEF 25-75 Pre: 1.02 L/sec
FEF2575-%Change-Post: 24 %
FEF2575-%PRED-POST: 53 %
FEF2575-%Pred-Pre: 43 %
FEV1-%Change-Post: 7 %
FEV1-%PRED-PRE: 70 %
FEV1-%Pred-Post: 75 %
FEV1-POST: 2.14 L
FEV1-Pre: 1.99 L
FEV1FVC-%CHANGE-POST: 3 %
FEV1FVC-%Pred-Pre: 85 %
FEV6-%CHANGE-POST: 3 %
FEV6-%Pred-Post: 87 %
FEV6-%Pred-Pre: 83 %
FEV6-Post: 3.13 L
FEV6-Pre: 3.01 L
FEV6FVC-%Change-Post: 0 %
FEV6FVC-%Pred-Post: 103 %
FEV6FVC-%Pred-Pre: 103 %
FVC-%CHANGE-POST: 3 %
FVC-%PRED-POST: 84 %
FVC-%Pred-Pre: 81 %
FVC-Post: 3.2 L
FVC-Pre: 3.08 L
POST FEV1/FVC RATIO: 67 %
PRE FEV1/FVC RATIO: 65 %
Post FEV6/FVC ratio: 98 %
Pre FEV6/FVC Ratio: 98 %
RV % PRED: 94 %
RV: 2.38 L
TLC % pred: 80 %
TLC: 5.67 L

## 2014-09-09 NOTE — Progress Notes (Signed)
PFT done today. 

## 2014-09-15 ENCOUNTER — Other Ambulatory Visit: Payer: Self-pay | Admitting: *Deleted

## 2014-09-15 DIAGNOSIS — R0602 Shortness of breath: Secondary | ICD-10-CM

## 2014-09-15 DIAGNOSIS — R942 Abnormal results of pulmonary function studies: Secondary | ICD-10-CM

## 2014-09-28 ENCOUNTER — Other Ambulatory Visit: Payer: Self-pay | Admitting: Internal Medicine

## 2014-10-01 ENCOUNTER — Ambulatory Visit (INDEPENDENT_AMBULATORY_CARE_PROVIDER_SITE_OTHER): Payer: Medicare Other | Admitting: *Deleted

## 2014-10-01 DIAGNOSIS — I4891 Unspecified atrial fibrillation: Secondary | ICD-10-CM | POA: Diagnosis not present

## 2014-10-01 DIAGNOSIS — I639 Cerebral infarction, unspecified: Secondary | ICD-10-CM

## 2014-10-01 DIAGNOSIS — I6350 Cerebral infarction due to unspecified occlusion or stenosis of unspecified cerebral artery: Secondary | ICD-10-CM

## 2014-10-01 DIAGNOSIS — Z7901 Long term (current) use of anticoagulants: Secondary | ICD-10-CM | POA: Diagnosis not present

## 2014-10-01 LAB — POCT INR: INR: 2.2

## 2014-10-02 ENCOUNTER — Ambulatory Visit (INDEPENDENT_AMBULATORY_CARE_PROVIDER_SITE_OTHER): Payer: Medicare Other | Admitting: Nurse Practitioner

## 2014-10-02 ENCOUNTER — Encounter: Payer: Self-pay | Admitting: Nurse Practitioner

## 2014-10-02 VITALS — BP 140/96 | HR 101 | Ht 71.0 in | Wt 188.4 lb

## 2014-10-02 DIAGNOSIS — I639 Cerebral infarction, unspecified: Secondary | ICD-10-CM

## 2014-10-02 DIAGNOSIS — I480 Paroxysmal atrial fibrillation: Secondary | ICD-10-CM

## 2014-10-02 DIAGNOSIS — R0600 Dyspnea, unspecified: Secondary | ICD-10-CM

## 2014-10-02 NOTE — Progress Notes (Signed)
PCP:  Foye Spurling, MD  The patient presents today for routine cardiology followup.  Since last being seen in our clinic, the patient had undergone evaluation for progressive dyspnea with a stress myoview, echo and pulmonary function testing. He showed evidence for pulmonary HTN, LV dysfunction,n EF 40-45%, as well as significant desaturation to 81% while walking on treadmill. Low risk scan for  ischemia. Stayed in SR on treadmill and reports to his knowledge he is not aware of any rhythm irregularity. Recent virtual colonoscopy suggested honeycombing of the lung bases, consistent with pulmoary fibrosis. He is pending an appointment with Dr. Chase Caller 4/11.   Today, he denies symptoms of palpitations, chest pain, orthopnea, PND, lower extremity edema, dizziness, presyncope, syncope, or neurologic sequela.  The patient feels that he is tolerating medications without difficulties and is otherwise without complaint today.   Past Medical History  Diagnosis Date  . A-fib     paroxysmal  . Hypertension   . 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  Current Outpatient Prescriptions  Medication Sig Dispense Refill  . allopurinol (ZYLOPRIM) 100 MG tablet Take 100 mg by mouth daily.    . Calcium Carbonate-Vitamin D (CALCIUM 600+D) 600-200 MG-UNIT TABS Take 1 tablet by mouth 2 (two) times daily.    Marland Kitchen diltiazem (CARDIZEM CD) 240 MG 24 hr capsule Take 240 mg by mouth daily.      . fexofenadine (ALLEGRA) 180 MG tablet Take 180 mg by mouth daily.     . Naproxen Sodium (ALEVE) 220 MG CAPS Take 220 mg by mouth daily as needed (pain).     . valsartan-hydrochlorothiazide (DIOVAN-HCT) 320-12.5 MG per tablet Take 1 tablet by mouth daily.    Marland Kitchen warfarin (COUMADIN) 5 MG tablet TAKE AS DIRECTED 120 tablet 1   No current facility-administered medications for this visit.    No Known Allergies  History   Social  History  . Marital Status: Married    Spouse Name: N/A  . Number of Children: N/A  . Years of Education: N/A   Occupational History  . Not on file.   Social History Main Topics  . Smoking status: Former Smoker    Types: Cigarettes    Quit date: 09/09/1991  . Smokeless tobacco: Never Used  . Alcohol Use: Yes  . Drug Use: No  . Sexual Activity:    Partners: Female   Other Topics Concern  . Not on file   Social History Narrative    Family History  Problem Relation Age of Onset  . Cancer Father     ROS-  All systems are reviewed and are negative except as outlined in the HPI above   Physical Exam: Filed Vitals:   08/20/14 1054  BP: 138/80  Pulse: 64  Height: _0  (1.803 m)  Weight: 186 lb 9.6 oz (84.641 kg)    GEN- The patient is well appearing, alert and oriented x 3 today.   Head- normocephalic, atraumatic Eyes-  Sclera clear, conjunctiva pink Ears- hearing intact Oropharynx- clear Neck- supple, no JVP Lymph- no cervical lymphadenopathy Lungs- Clear to ausculation bilaterally, normal work of breathing Heart- Regular rate and rhythm, no murmurs, rubs or gallops, PMI not laterally displaced GI- soft, NT, ND, + BS Extremities- no clubbing, cyanosis, or edema MS- no significant deformity or atrophy Skin- no rash or lesion Psych- euthymic mood, full affect Neuro- strength and  sensation are intact  ekg today reveals sinus rhythm, with rare PVC.  Virtual colonoscopy Large low-attenuation areas in both kidneys most consistent with cysts some of which are parapelvic. Consider either ultrasound of the kidneys or CT of the abdomen and pelvis with IV contrast to assess further. 2. Subpleural reticulation and honeycombing at the lung bases consistent with pulmonary fibrosis. 3. 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   Myoview-Impression Exercise Capacity: There is decreased exercise capacity with extreme shortness of breath  early in stress. There was a decrease in O2 saturation with walking as low as 81% BP Response: Normal blood pressure response. Clinical Symptoms: Severe shortness of breath with walking ECG Impression: No significant ST segment change suggestive of ischemia. Comparison with Prior Nuclear Study: No images to compare  Overall Impression: The study is abnormal. It is a low risk scan. There is no definite scar or ischemia. There is mild left ventricular dysfunction. The patient had limited exercise tolerance. There was significant decrease in O2 saturation and marked dyspnea with exercise.  LV Ejection Fraction: 41%. LV Wall Motion: There is mild global hypokinesis.  Echo- Left ventricle: The cavity size was normal. Wall thickness was increased in a pattern of mild LVH. Systolic function was mildly to moderately reduced. The estimated ejection fraction was in the range of 40% to 45%. Diffuse hypokinesis with no identifiable regional variations. Doppler parameters are consistent with abnormal left ventricular relaxation (grade 1 diastolic dysfunction). Indeterminate ventricular filling pressure by Doppler parameters. - Aortic valve: There was trivial regurgitation. - Tricuspid valve: There was mild-moderate regurgitation directed centrally. - Pulmonary arteries: Systolic pressure was moderately increased. PA peak pressure: 61 mm Hg (S).  Assessment and Plan:  1. afib Well controlled No symptomatic recurrence since last year chads2vasc score is at least 4.  Continue coumadin as recent INRs have been very stable  2. htn Stable 2 gram sodium restriction   3. Prior stroke Continue long term anticoagulation  4. Progressive dyspnea.  Concerning F/U with pulmonary as scheduled 4/11  5. LV dysfunction  Would like to transition off CCB and add BB, but am hesitant until evaluated by pulmonologist for fear of worsening dyspnea. Will see back in6-8 weeks at which  time he should have initial pulmonary evaluation completed and will see if addition of BB can be done. Continue ARB  F/u Dr. Rayann Heman in one year

## 2014-10-02 NOTE — Patient Instructions (Signed)
Your physician recommends that you schedule a follow-up appointment in: 2 months after your 4/11 appointment with Pulmonary

## 2014-10-06 ENCOUNTER — Telehealth: Payer: Self-pay | Admitting: Internal Medicine

## 2014-10-06 NOTE — Telephone Encounter (Signed)
New message      Talk to [facility] regarding his appt with the pulm doctor.  I am really not sure what he want.

## 2014-10-06 NOTE — Telephone Encounter (Signed)
Got an EOB and he was questioning who Dr Annamaria Boots was.  I let him know he is the pulmonologist that read his PFT test on 09/09/14.  That was all  he needed to know.

## 2014-10-20 ENCOUNTER — Ambulatory Visit (INDEPENDENT_AMBULATORY_CARE_PROVIDER_SITE_OTHER): Payer: Medicare Other | Admitting: Internal Medicine

## 2014-10-20 ENCOUNTER — Encounter: Payer: Self-pay | Admitting: Internal Medicine

## 2014-10-20 VITALS — BP 144/98 | HR 80 | Ht 70.0 in | Wt 186.0 lb

## 2014-10-20 DIAGNOSIS — Z87891 Personal history of nicotine dependence: Secondary | ICD-10-CM | POA: Insufficient documentation

## 2014-10-20 DIAGNOSIS — Z720 Tobacco use: Secondary | ICD-10-CM | POA: Diagnosis not present

## 2014-10-20 DIAGNOSIS — R1310 Dysphagia, unspecified: Secondary | ICD-10-CM | POA: Diagnosis not present

## 2014-10-20 DIAGNOSIS — R0689 Other abnormalities of breathing: Secondary | ICD-10-CM | POA: Diagnosis not present

## 2014-10-20 DIAGNOSIS — I639 Cerebral infarction, unspecified: Secondary | ICD-10-CM

## 2014-10-20 DIAGNOSIS — R0600 Dyspnea, unspecified: Secondary | ICD-10-CM | POA: Insufficient documentation

## 2014-10-20 NOTE — Progress Notes (Signed)
Subjective:    Patient ID: Patrick Weiss, male    DOB: Apr 02, 1941, 74 y.o.   MRN: [id number]  PCP Foye Spurling, MD   HPI   IOV 10/20/2014  Chief Complaint  Patient presents with  . Pulmonary Consult    Pt referred by Dr. Rayann Heman for abnormal PFT.    74 year old male with chronic systolic heart failure referred by cardiology for chronic shortness of breath, abnormal PFTs and rule out pulmonary causes. He is ex-heavy smoker 30 pack but quit over 20 years ago. His reporting insidious onset of dyspnea for the last few years but progressive in the last 1 year. He notices it when he climbs steps. It is relieved by rest. There is associated wheezing but no cough or sputum. However for the last few months he is noticing dysphagia that is progressively getting worse. He notices it for certain specific solids but is unable to characterize this any further.   Lung cut and virtual colonoscopy 02/18/2014: Suggestion of UIP pulmonary fibrosis findings  Pulmonary function test 09/09/2014 prebronchodilator FVC of 3.08 L/81%, FEV1 of 1.99 L/70% a ratio of 65. Spirometry suggests restriction. Postbronchodilator FEV1 of 2.14 L/75% with a mild nonsignificant bronchi dilator response. Total lung capacity of 5.7 L/80% and normal. Diffusion capacity is significantly reduced at 9.5 L/29%  Cardiac echo 17/61/6073: Chronic systolic heart failure with ejection fraction of 40%    has a past medical history of A-fib; Hypertension; and Stroke. chads vasc score of 4 and on chronic Coumadin   reports that he quit smoking about 23 years ago. His smoking use included Cigarettes. He has a 30 pack-year smoking history. He has never used smokeless tobacco.  Past Surgical History  Procedure Laterality Date  . Back surgery  2001  . Tibia fracture surgery  1975    No Known Allergies  Immunization History  Administered Date(s) Administered  . Influenza Split 03/11/2014    Family History  Problem Relation Age  of Onset  . Cancer Father      Current outpatient prescriptions:  .  allopurinol (ZYLOPRIM) 100 MG tablet, Take 100 mg by mouth daily., Disp: , Rfl:  .  Calcium Carbonate-Vitamin D (CALCIUM 600+D) 600-200 MG-UNIT TABS, Take 1 tablet by mouth 2 (two) times daily., Disp: , Rfl:  .  diltiazem (CARDIZEM CD) 240 MG 24 hr capsule, Take 240 mg by mouth daily.  , Disp: , Rfl:  .  fexofenadine (ALLEGRA) 180 MG tablet, Take 180 mg by mouth daily. , Disp: , Rfl:  .  Naproxen Sodium (ALEVE) 220 MG CAPS, Take 220 mg by mouth daily as needed (pain). , Disp: , Rfl:  .  valsartan-hydrochlorothiazide (DIOVAN-HCT) 320-12.5 MG per tablet, Take 1 tablet by mouth daily., Disp: , Rfl:  .  warfarin (COUMADIN) 5 MG tablet, TAKE AS DIRECTED, Disp: 120 tablet, Rfl: 1     Review of Systems  Constitutional: Negative for fever and unexpected weight change.  HENT: Positive for rhinorrhea. Negative for congestion, dental problem, ear pain, nosebleeds, postnasal drip, sinus pressure, sneezing, sore throat and trouble swallowing.   Eyes: Negative for redness and itching.  Respiratory: Positive for choking and shortness of breath. Negative for cough, chest tightness and wheezing.   Cardiovascular: Negative for palpitations and leg swelling.  Gastrointestinal: Negative for nausea and vomiting.  Genitourinary: Negative for dysuria.  Musculoskeletal: Negative for joint swelling.  Skin: Negative for rash.  Neurological: Negative for headaches.  Hematological: Does not bruise/bleed easily.  Psychiatric/Behavioral: Negative for dysphoric mood.  The patient is not nervous/anxious.        Objective:   Physical Exam  Constitutional: He is oriented to person, place, and time. He appears well-developed and well-nourished. No distress.  HENT:  Head: Normocephalic and atraumatic.  Right Ear: External ear normal.  Left Ear: External ear normal.  Mouth/Throat: Oropharynx is clear and moist. No oropharyngeal exudate.  Eyes:  Conjunctivae and EOM are normal. Pupils are equal, round, and reactive to light. Right eye exhibits no discharge. Left eye exhibits no discharge. No scleral icterus.  Neck: Normal range of motion. Neck supple. No JVD present. No tracheal deviation present. No thyromegaly present.  Cardiovascular: Normal rate, regular rhythm and intact distal pulses.  Exam reveals no gallop and no friction rub.   No murmur heard. Pulmonary/Chest: Effort normal and breath sounds normal. No respiratory distress. He has no wheezes. He has no rales. He exhibits no tenderness.  No crackles  Abdominal: Soft. Bowel sounds are normal. He exhibits no distension and no mass. There is no tenderness. There is no rebound and no guarding.  Musculoskeletal: Normal range of motion. He exhibits no edema or tenderness.  No clubbing  Lymphadenopathy:    He has no cervical adenopathy.  Neurological: He is alert and oriented to person, place, and time. He has normal reflexes. No cranial nerve deficit. Coordination normal.  Skin: Skin is warm and dry. No rash noted. He is not diaphoretic. No erythema. No pallor.  Psychiatric: He has a normal mood and affect. His behavior is normal. Judgment and thought content normal.  Nursing note and vitals reviewed.   Filed Vitals:   10/20/14 0908  BP: 144/98  Pulse: 80   Filed Vitals:   10/20/14 0908  BP: 144/98  Pulse: 80  Height: _0  (1.778 m)  Weight: 186 lb (84.369 kg)  SpO2: 95%          Assessment & Plan:     ICD-9-CM ICD-10-CM   1. Dyspnea and respiratory abnormality 786.09 R06.00 CT Chest High Resolution    R06.89 Ambulatory referral to Gastroenterology  2. Smoking history V15.82 Z72.0 CT Chest High Resolution     Ambulatory referral to Gastroenterology  3. 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 However he need better differentiation and clarification of this. We'll need to get a high-resolution CT scan of the chest and decide. If indeed the CT scan of the chest shows UIP pulmonary fibrosis findings he will need autoimmune profile before he revisits with me. If he has COPD then a beta-1 specific beta blocker would be preferred as opposed to a nonspecific beta blocker but we will make this determination at follow-up   Dysphagia might be linked with interstitial lung disease. Therefore he needs a GI referral which I have done  He understands and is agreeable with the plan   Dr. Brand Males, M.D., [facility].C.P Pulmonary and Critical Care Medicine Staff Physician [place] [facility] Pager: [phone], If no answer or between  15:00h - 7:00h: call [phone]  10/20/2014 9:28 AM

## 2014-10-20 NOTE — Patient Instructions (Addendum)
ICD-9-CM ICD-10-CM   1. Dyspnea and respiratory abnormality 786.09 R06.00 CT Chest High Resolution    R06.89   2. Smoking history V15.82 Z72.0 CT Chest High Resolution  3. Dysphagia 787.20 R13.10    Do High Res CT chest ASAP  Depending on results - will advise to come in or have blood work and then come in Decision on beta blocker at followup GI referral for dysphagia

## 2014-10-23 ENCOUNTER — Ambulatory Visit (INDEPENDENT_AMBULATORY_CARE_PROVIDER_SITE_OTHER)
Admission: RE | Admit: 2014-10-23 | Discharge: 2014-10-23 | Disposition: A | Discharge status: Home / Self Care | Payer: Medicare Other | Source: Ambulatory Visit | Attending: Internal Medicine | Admitting: Internal Medicine

## 2014-10-23 DIAGNOSIS — Z87891 Personal history of nicotine dependence: Secondary | ICD-10-CM

## 2014-10-23 DIAGNOSIS — R0600 Dyspnea, unspecified: Secondary | ICD-10-CM | POA: Diagnosis not present

## 2014-10-23 DIAGNOSIS — Z720 Tobacco use: Secondary | ICD-10-CM | POA: Diagnosis not present

## 2014-10-23 DIAGNOSIS — R0689 Other abnormalities of breathing: Secondary | ICD-10-CM | POA: Diagnosis not present

## 2014-11-02 ENCOUNTER — Telehealth: Payer: Self-pay | Admitting: Internal Medicine

## 2014-11-02 DIAGNOSIS — J84112 Idiopathic pulmonary fibrosis: Secondary | ICD-10-CM

## 2014-11-02 NOTE — Telephone Encounter (Signed)
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 Brand Males, M.D., [facility].C.P Pulmonary and Critical Care Medicine Staff Physician [place] [facility] Pager: [phone], If no answer or between  15:00h - 7:00h: call [phone]  11/02/2014 5:25 PM

## 2014-11-03 NOTE — Telephone Encounter (Signed)
Pt returned call  [phone]

## 2014-11-03 NOTE — Telephone Encounter (Signed)
lmtcb for pt.  

## 2014-11-03 NOTE — Telephone Encounter (Signed)
Pt is aware of results and lab orders have been placed. Nothing further was needed.

## 2014-11-04 ENCOUNTER — Other Ambulatory Visit (INDEPENDENT_AMBULATORY_CARE_PROVIDER_SITE_OTHER): Payer: Medicare Other

## 2014-11-04 DIAGNOSIS — J84112 Idiopathic pulmonary fibrosis: Secondary | ICD-10-CM | POA: Diagnosis not present

## 2014-11-04 LAB — SEDIMENTATION RATE: Sed Rate: 3 mm/hr (ref 0–22)

## 2014-11-05 ENCOUNTER — Telehealth: Payer: Self-pay | Admitting: Internal Medicine

## 2014-11-05 ENCOUNTER — Other Ambulatory Visit: Payer: Self-pay | Admitting: Gastroenterology

## 2014-11-05 DIAGNOSIS — R1310 Dysphagia, unspecified: Secondary | ICD-10-CM

## 2014-11-05 LAB — CYCLIC CITRUL PEPTIDE ANTIBODY, IGG: Cyclic Citrullin Peptide Ab: 3.4 U/mL (ref 0.0–5.0)

## 2014-11-05 LAB — RHEUMATOID FACTOR: Rhuematoid fact SerPl-aCnc: 73.3 IU/mL — ABNORMAL HIGH (ref 0.0–13.9)

## 2014-11-05 LAB — ANA: Anti Nuclear Antibody(ANA): POSITIVE — AB

## 2014-11-05 NOTE — Telephone Encounter (Signed)
Pt would like his results from his CT and blood work.  MR >> please advise. Thanks.

## 2014-11-05 NOTE — Telephone Encounter (Signed)
Called and spoke to pt. Informed pt of the results and recs per MR. Appt made with TP on 5/10. Pt verbalized understanding and denied any further questions or concerns at this time.

## 2014-11-05 NOTE — Telephone Encounter (Signed)
I already advised CT results on phone - UIP pattern of fibrosis.  Autoimmune : ESR and CCP normal. Labs say that RF and ANA orderd but not done - what does this mean?  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 So have him come and see me for fu next 10 days but I need to know what happened to the RF and ANA order

## 2014-11-06 ENCOUNTER — Other Ambulatory Visit (HOSPITAL_COMMUNITY): Payer: Self-pay | Admitting: Gastroenterology

## 2014-11-06 DIAGNOSIS — R1314 Dysphagia, pharyngoesophageal phase: Secondary | ICD-10-CM

## 2014-11-12 ENCOUNTER — Telehealth: Payer: Self-pay | Admitting: Internal Medicine

## 2014-11-12 ENCOUNTER — Ambulatory Visit (INDEPENDENT_AMBULATORY_CARE_PROVIDER_SITE_OTHER): Payer: Medicare Other | Admitting: *Deleted

## 2014-11-12 DIAGNOSIS — I6350 Cerebral infarction due to unspecified occlusion or stenosis of unspecified cerebral artery: Secondary | ICD-10-CM

## 2014-11-12 DIAGNOSIS — Z7901 Long term (current) use of anticoagulants: Secondary | ICD-10-CM | POA: Diagnosis not present

## 2014-11-12 DIAGNOSIS — I4891 Unspecified atrial fibrillation: Secondary | ICD-10-CM

## 2014-11-12 DIAGNOSIS — I639 Cerebral infarction, unspecified: Secondary | ICD-10-CM | POA: Diagnosis not present

## 2014-11-12 LAB — POCT INR: INR: 2.4

## 2014-11-12 NOTE — Telephone Encounter (Signed)
Hhis RF and ANA positive. This makes autoimmune lung dz more likely . Wonder if so why CCP not positive  He is seeing you TP 11/18/14. My initial bias was to start esbriet/ofv if autoimmune negative but with these 2 positive, recommenda  A) copd Rx for emphysema on CT B) pulm rehab C) rheum referral for eval of autoimmune dz D) PFT in 3 months DD)  ROV 3 months with me

## 2014-11-18 ENCOUNTER — Ambulatory Visit (INDEPENDENT_AMBULATORY_CARE_PROVIDER_SITE_OTHER): Payer: Medicare Other | Admitting: Adult Health

## 2014-11-18 ENCOUNTER — Encounter: Payer: Self-pay | Admitting: Adult Health

## 2014-11-18 VITALS — BP 138/88 | HR 81 | Temp 97.9°F | Ht 71.0 in | Wt 188.0 lb

## 2014-11-18 DIAGNOSIS — J432 Centrilobular emphysema: Secondary | ICD-10-CM | POA: Diagnosis not present

## 2014-11-18 DIAGNOSIS — I639 Cerebral infarction, unspecified: Secondary | ICD-10-CM

## 2014-11-18 DIAGNOSIS — J849 Interstitial pulmonary disease, unspecified: Secondary | ICD-10-CM | POA: Diagnosis not present

## 2014-11-18 DIAGNOSIS — J449 Chronic obstructive pulmonary disease, unspecified: Secondary | ICD-10-CM | POA: Insufficient documentation

## 2014-11-18 MED ORDER — TIOTROPIUM BROMIDE MONOHYDRATE 2.5 MCG/ACT IN AERS
2.0000 | INHALATION_SPRAY | Freq: Every day | RESPIRATORY_TRACT | Status: DC
Start: 2014-11-18 — End: 2014-11-26

## 2014-11-18 NOTE — Patient Instructions (Addendum)
Begin Spiriva Respimat 2 puff daily  Refer to rheumatology.  Call back if you change your mind on pulmonary rehab.  follow up Dr. Chase Caller in 3 months with PFT

## 2014-11-18 NOTE — Assessment & Plan Note (Signed)
ANA and RA + , Refer to rheumatology.  follow up Dr. Chase Caller in 3 months with PFT

## 2014-11-18 NOTE — Assessment & Plan Note (Signed)
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  PFT showed restriction and obstructive dz.   Plan  Begin Spiriva Respimat 2 puff daily  Refer to rheumatology.  Call back if you change your mind on pulmonary rehab.  follow up Dr. Chase Caller in 3 months with PFT

## 2014-11-18 NOTE — Progress Notes (Signed)
Subjective:    Patient ID: Patrick Weiss, male    DOB: Jun 03, 1941, 74 y.o.   MRN: [id number]  PCP Foye Spurling, MD   HPI   IOV 10/20/2014  Chief Complaint  Patient presents with  . Pulmonary Consult    Pt referred by Dr. Rayann Heman for abnormal PFT.    74 year old male with chronic systolic heart failure referred by cardiology for chronic shortness of breath, abnormal PFTs and rule out pulmonary causes. He is ex-heavy smoker 30 pack but quit over 20 years ago. His reporting insidious onset of dyspnea for the last few years but progressive in the last 1 year. He notices it when he climbs steps. It is relieved by rest. There is associated wheezing but no cough or sputum. However for the last few months he is noticing dysphagia that is progressively getting worse. He notices it for certain specific solids but is unable to characterize this any further.   Lung cut and virtual colonoscopy 02/18/2014: Suggestion of UIP pulmonary fibrosis findings  Pulmonary function test 09/09/2014 prebronchodilator FVC of 3.08 L/81%, FEV1 of 1.99 L/70% a ratio of 65. Spirometry suggests restriction. Postbronchodilator FEV1 of 2.14 L/75% with a mild nonsignificant bronchi dilator response. Total lung capacity of 5.7 L/80% and normal. Diffusion capacity is significantly reduced at 9.5 L/29%  Cardiac echo 74/25/9563: Chronic systolic heart failure with ejection fraction of 40%   11/18/2014 Follow up  Pt returns for a 1 month follow up .  Was seen last ov for pulmonary consult for dyspnea and ? pulm fibrosis .  CT chest on April 14 showed a combination of severe emphysema as well as fibrotic changes in the lungs they have progressed with presence of honeycombing and concerning for progressively worsening usual interstitial pneumonia  lab work showed a positive ANA and rheumatoid factor. Pt has hx of heavy smoking x 94yr  No sign change from last ov, gets winded with incline and waking long distances.  Denies  chest pain, orthopnea, joint swelling, rash, hemoptysis.    Review of Systems  Constitutional: Negative for fever and unexpected weight change.  HENT: Positive for rhinorrhea. Negative for congestion, dental problem, ear pain, nosebleeds, postnasal drip, sinus pressure, sneezing, sore throat and trouble swallowing.   Eyes: Negative for redness and itching.  Respiratory: Positive   and shortness of breath. Negative for cough, chest tightness and wheezing.   Cardiovascular: Negative for palpitations and leg swelling.  Gastrointestinal: Negative for nausea and vomiting.  Genitourinary: Negative for dysuria.  Musculoskeletal: Negative for joint swelling.  Skin: Negative for rash.  Neurological: Negative for headaches.  Hematological: Does not bruise/bleed easily.  Psychiatric/Behavioral: Negative for dysphoric mood. The patient is not nervous/anxious.        Objective:   Physical Exam  Constitutional: He is oriented to person, place, and time. He appears well-developed and well-nourished. No distress.  HENT:  Head: Normocephalic and atraumatic.  Right Ear: External ear normal.  Left Ear: External ear normal.  Mouth/Throat: Oropharynx is clear and moist. No oropharyngeal exudate.  Eyes: Conjunctivae and EOM are normal. Pupils are equal, round, and reactive to light. Right eye exhibits no discharge. Left eye exhibits no discharge. No scleral icterus.  Neck: Normal range of motion. Neck supple. No JVD present. No tracheal deviation present. No thyromegaly present.  Cardiovascular: Normal rate, regular rhythm and intact distal pulses.  Exam reveals no gallop and no friction rub.   No murmur heard. Pulmonary/Chest: Effort normal and breath sounds normal. No respiratory distress.  He has no wheezes. He has no rales. He exhibits no tenderness.  No crackles  Abdominal: Soft. Bowel sounds are normal. He exhibits no distension and no mass. There is no tenderness. There is no rebound and no  guarding.  Musculoskeletal: Normal range of motion. He exhibits no edema or tenderness.  No clubbing  Lymphadenopathy:    He has no cervical adenopathy.  Neurological: He is alert and oriented to person, place, and time. He has normal reflexes. No cranial nerve deficit. Coordination normal.  Skin: Skin is warm and dry. No rash noted. He is not diaphoretic. No erythema. No pallor.  Psychiatric: He has a normal mood and affect. His behavior is normal. Judgment and thought content normal.  Nursing note and vitals reviewed.   HRCT 10/27/14  There is a combination of emphysematous changes (severe centrilobular and moderate paraseptal emphysema), as well as fibrotic changes in the lungs, as detailed above. 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       Assessment & Plan:

## 2014-11-18 NOTE — Addendum Note (Signed)
Addended by: Osa Craver on: 11/18/2014 03:16 PM   Modules accepted: Orders

## 2014-11-19 ENCOUNTER — Inpatient Hospital Stay: Admission: RE | Admit: 2014-11-19 | Payer: Self-pay | Source: Ambulatory Visit

## 2014-11-19 ENCOUNTER — Ambulatory Visit
Admission: RE | Admit: 2014-11-19 | Discharge: 2014-11-19 | Disposition: A | Discharge status: Home / Self Care | Payer: Medicare Other | Source: Ambulatory Visit | Attending: Gastroenterology | Admitting: Gastroenterology

## 2014-11-19 ENCOUNTER — Other Ambulatory Visit: Payer: Self-pay | Admitting: Gastroenterology

## 2014-11-19 DIAGNOSIS — R1310 Dysphagia, unspecified: Secondary | ICD-10-CM

## 2014-11-20 ENCOUNTER — Institutional Professional Consult (permissible substitution): Payer: Medicare Other | Admitting: Internal Medicine

## 2014-11-25 ENCOUNTER — Telehealth: Payer: Self-pay | Admitting: Adult Health

## 2014-11-25 DIAGNOSIS — J432 Centrilobular emphysema: Secondary | ICD-10-CM

## 2014-11-25 NOTE — Telephone Encounter (Signed)
Spoke with pt, wants to know if he is to continue taking the spiriva respimat after he runs out of his sample.   Also, is interested in enrolling in pulmonary rehab if his schedule allows.    MR are you ok with enrolling this pt on pulmonary rehab, and pt continuing his spiriva respimat?  He states this is working well for him.

## 2014-11-26 MED ORDER — TIOTROPIUM BROMIDE MONOHYDRATE 2.5 MCG/ACT IN AERS: 2.0000 | INHALATION_SPRAY | Freq: Every day | RESPIRATORY_TRACT | Status: DC

## 2014-11-26 NOTE — Telephone Encounter (Signed)
Yes for spiriva respimat  YEs for rehab referral  Thanks Dr. Brand Males, M.D., [facility].C.P Pulmonary and Critical Care Medicine Staff Physician [place] [facility] Pager: [phone], If no answer or between  15:00h - 7:00h: call [phone]  11/26/2014 10:19 AM

## 2014-11-26 NOTE — Telephone Encounter (Signed)
spiriva respimat refilled, referral to pulmonary rehab placed.  Nothing further needed.

## 2014-11-27 ENCOUNTER — Other Ambulatory Visit (HOSPITAL_COMMUNITY): Payer: Self-pay | Admitting: Gastroenterology

## 2014-11-27 ENCOUNTER — Inpatient Hospital Stay (HOSPITAL_COMMUNITY): Admission: RE | Admit: 2014-11-27 | Payer: Self-pay | Source: Ambulatory Visit

## 2014-11-27 ENCOUNTER — Ambulatory Visit (HOSPITAL_COMMUNITY)
Admission: RE | Admit: 2014-11-27 | Discharge: 2014-11-27 | Disposition: A | Discharge status: Home / Self Care | Payer: Medicare Other | Source: Ambulatory Visit | Attending: Gastroenterology | Admitting: Gastroenterology

## 2014-11-27 DIAGNOSIS — R1314 Dysphagia, pharyngoesophageal phase: Secondary | ICD-10-CM | POA: Diagnosis present

## 2014-11-27 DIAGNOSIS — R1310 Dysphagia, unspecified: Secondary | ICD-10-CM | POA: Insufficient documentation

## 2014-12-03 ENCOUNTER — Telehealth: Payer: Self-pay | Admitting: Internal Medicine

## 2014-12-03 NOTE — Telephone Encounter (Signed)
New message      Calling to see if you received the medical info he dropped off.

## 2014-12-03 NOTE — Telephone Encounter (Signed)
lmom for patient that I do not have papers that were dropped off  Please call me to let me know when dropped off and what its about

## 2014-12-04 NOTE — Telephone Encounter (Signed)
Ok to hold warfarin day prior to procedure.  Restart night of, unless bleeding significantly, in which case hold x 1 more dose.  Pt has history of CVA, do not recommend holding any longer than 2 days maximum.  Per protocol.

## 2014-12-04 NOTE — Telephone Encounter (Signed)
Spoke with patient and the procedure Chiropodist) has not been schedule. Dr Rosemary Holms at [facility], [place] fax number [phone] is preforming.  MD sent letter requesting Coumadin be held 1-2 days prior.

## 2014-12-04 NOTE — Telephone Encounter (Signed)
Faxed to MD

## 2014-12-15 ENCOUNTER — Ambulatory Visit (HOSPITAL_COMMUNITY)
Admission: RE | Admit: 2014-12-15 | Discharge: 2014-12-15 | Disposition: A | Discharge status: Home / Self Care | Payer: Medicare Other | Source: Ambulatory Visit | Attending: Nurse Practitioner | Admitting: Nurse Practitioner

## 2014-12-15 ENCOUNTER — Encounter (HOSPITAL_COMMUNITY): Payer: Self-pay | Admitting: Nurse Practitioner

## 2014-12-15 VITALS — BP 140/98 | HR 92 | Ht 71.0 in | Wt 187.4 lb

## 2014-12-15 DIAGNOSIS — Z79899 Other long term (current) drug therapy: Secondary | ICD-10-CM | POA: Diagnosis not present

## 2014-12-15 DIAGNOSIS — I480 Paroxysmal atrial fibrillation: Secondary | ICD-10-CM

## 2014-12-15 DIAGNOSIS — Z87891 Personal history of nicotine dependence: Secondary | ICD-10-CM | POA: Insufficient documentation

## 2014-12-15 DIAGNOSIS — Z7901 Long term (current) use of anticoagulants: Secondary | ICD-10-CM | POA: Diagnosis not present

## 2014-12-15 DIAGNOSIS — I519 Heart disease, unspecified: Secondary | ICD-10-CM | POA: Insufficient documentation

## 2014-12-15 DIAGNOSIS — J439 Emphysema, unspecified: Secondary | ICD-10-CM | POA: Insufficient documentation

## 2014-12-15 DIAGNOSIS — I272 Other secondary pulmonary hypertension: Secondary | ICD-10-CM | POA: Diagnosis not present

## 2014-12-15 DIAGNOSIS — I10 Essential (primary) hypertension: Secondary | ICD-10-CM | POA: Diagnosis not present

## 2014-12-15 DIAGNOSIS — I4891 Unspecified atrial fibrillation: Secondary | ICD-10-CM | POA: Diagnosis present

## 2014-12-15 DIAGNOSIS — Z8673 Personal history of transient ischemic attack (TIA), and cerebral infarction without residual deficits: Secondary | ICD-10-CM | POA: Insufficient documentation

## 2014-12-15 NOTE — Progress Notes (Addendum)
Patient ID: Patrick Weiss, male   DOB: Mar 23, 1941, 74 y.o.   MRN: [id number]   . PCP:  Foye Spurling, MD  The patient presents today for routine cardiology followup.  Since last being seen in our clinic, the patient had undergone evaluation for progressive dyspnea with a stress myoview, echo and pulmonary function testing. He showed evidence for pulmonary HTN, LV dysfunction,n EF 40-45%, as well as significant desaturation to 81% while walking on treadmill. Low risk scan for  ischemia. Stayed in SR on treadmill and reports to his knowledge he is not aware of any rhythm irregularity. Recent virtual colonoscopy suggested honeycombing of the lung bases, consistent with pulmoary fibrosis. He is pending an appointment with Dr. Chase Caller 4/11.   He underwent further pulmonary evaluation with the dx of severe centrilobular and moderate paraseptal emphysema as well as fibrotic changes in the lungs, concerning for progressively worsening usual interstitial pneumonia. He has been started on an inhaler and has been encouraged to attend pulmonary rehab.  He has not noticed any afib episodes and EKG shows SR. Feels his dyspnea is unchanged from usual pattern. Continues on warfarin without bleeding issues.  Today, he denies symptoms of palpitations, chest pain, orthopnea, PND, lower extremity edema, dizziness, presyncope, syncope, or neurologic sequela.  The patient feels that he is tolerating medications without difficulties and is otherwise without complaint today.   Past Medical History  Diagnosis Date  . A-fib     paroxysmal  . Hypertension   . 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  Current Outpatient Prescriptions  Medication Sig Dispense Refill  . allopurinol (ZYLOPRIM) 100 MG tablet Take 100 mg by mouth daily.    . Calcium Carbonate-Vitamin D (CALCIUM 600+D) 600-200 MG-UNIT TABS Take 1 tablet by mouth  2 (two) times daily.    Marland Kitchen diltiazem (CARDIZEM CD) 240 MG 24 hr capsule Take 240 mg by mouth daily.      . fexofenadine (ALLEGRA) 180 MG tablet Take 180 mg by mouth daily.     . Naproxen Sodium (ALEVE) 220 MG CAPS Take 220 mg by mouth daily as needed (pain).     . valsartan-hydrochlorothiazide (DIOVAN-HCT) 320-12.5 MG per tablet Take 1 tablet by mouth daily.    Marland Kitchen warfarin (COUMADIN) 5 MG tablet TAKE AS DIRECTED 120 tablet 1   No current facility-administered medications for this visit.    No Known Allergies  History   Social History  . Marital Status: Married    Spouse Name: N/A  . Number of Children: N/A  . Years of Education: N/A   Occupational History  . Not on file.   Social History Main Topics  . Smoking status: Former Smoker    Types: Cigarettes    Quit date: 09/09/1991  . Smokeless tobacco: Never Used  . Alcohol Use: Yes  . Drug Use: No  . Sexual Activity:    Partners: Female   Other Topics Concern  . Not on file   Social History Narrative    Family History  Problem Relation Age of Onset  . Cancer Father     ROS-  All systems are reviewed and are negative except as outlined in the HPI above   Physical Exam: Filed Vitals:   08/20/14 1054  BP: 138/80  Pulse: 64  Height: _0  (1.803 m)  Weight: 186 lb 9.6 oz (84.641 kg)  GEN- The patient is well appearing, alert and oriented x 3 today.   Head- normocephalic, atraumatic Eyes-  Sclera clear, conjunctiva pink Ears- hearing intact Oropharynx- clear Neck- supple, no JVP Lymph- no cervical lymphadenopathy Lungs- Clear to ausculation bilaterally, normal work of breathing Heart- Regular rate and rhythm, no murmurs, rubs or gallops, PMI not laterally displaced GI- soft, NT, ND, + BS Extremities- no clubbing, cyanosis, or edema MS- no significant deformity or atrophy Skin- no rash or lesion Psych- euthymic mood, full affect Neuro- strength and sensation are intact  ekg today reveals sinus rhythm,  92 bpm, with Supraventricular complexes,NSTW, PR int 138 ms, QRS 88 ms, QTc 484 ms  Virtual colonoscopy Large low-attenuation areas in both kidneys most consistent with cysts some of which are parapelvic. Consider either ultrasound of the kidneys or CT of the abdomen and pelvis with IV contrast to assess further. 2. Subpleural reticulation and honeycombing at the lung bases consistent with pulmonary fibrosis. 3. 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   Myoview-Impression Exercise Capacity: There is decreased exercise capacity with extreme shortness of breath early in stress. There was a decrease in O2 saturation with walking as low as 81% BP Response: Normal blood pressure response. Clinical Symptoms: Severe shortness of breath with walking ECG Impression: No significant ST segment change suggestive of ischemia. Comparison with Prior Nuclear Study: No images to compare  Overall Impression: The study is abnormal. It is a low risk scan. There is no definite scar or ischemia. There is mild left ventricular dysfunction. The patient had limited exercise tolerance. There was significant decrease in O2 saturation and marked dyspnea with exercise.  LV Ejection Fraction: 41%. LV Wall Motion: There is mild global hypokinesis.  Echo- Left ventricle: The cavity size was normal. Wall thickness was increased in a pattern of mild LVH. Systolic function was mildly to moderately reduced. The estimated ejection fraction was in the range of 40% to 45%. Diffuse hypokinesis with no identifiable regional variations. Doppler parameters are consistent with abnormal left ventricular relaxation (grade 1 diastolic dysfunction). Indeterminate ventricular filling pressure by Doppler parameters. - Aortic valve: There was trivial regurgitation. - Tricuspid valve: There was mild-moderate regurgitation directed centrally. - Pulmonary arteries: Systolic pressure  was moderately increased. PA peak pressure: 61 mm Hg (S).  Assessment and Plan:  1. afib Well controlled No symptomatic recurrence since last year chads2vasc score is at least 4.  Continue coumadin as recent INRs have been very stable  2. htn Stable 2 gram sodium restriction   3. Prior stroke Continue long term anticoagulation  4. Progressive dyspnea/ emphysema/fibrosis F/U with pulmonary as scheduled  Inhaler Pulmonary rehab  5. LV dysfunction  Would like to transition off CCB and add BB, for LV dysfunction, but am hesitant until hear from pulmonologist for fear of worsening dyspnea.I will staff message to see if possibility. F/u in four months. Continue ARB

## 2014-12-15 NOTE — Addendum Note (Signed)
Encounter addended by: Sherran Needs, NP on: 12/15/2014  2:02 PM<BR>     Documentation filed: Notes Section

## 2014-12-15 NOTE — Patient Instructions (Signed)
Afib clinic turn onto [address] from [address] the 3rd entrance on the right. Park in the [facility] parking garage that is to the right once you turn in the driveway. The parking code is [id number] Get on elevator to first floor in the garage.  Our phone number is [phone].  We will be in touch with you regarding if can start new medication after speaking with your lung doctor

## 2014-12-24 ENCOUNTER — Telehealth (HOSPITAL_COMMUNITY): Payer: Self-pay | Admitting: Nurse Practitioner

## 2014-12-24 ENCOUNTER — Other Ambulatory Visit (HOSPITAL_COMMUNITY): Payer: Self-pay | Admitting: *Deleted

## 2014-12-24 MED ORDER — NEBIVOLOL HCL 5 MG PO TABS: 5.0000 mg | ORAL_TABLET | Freq: Every day | ORAL | Status: DC

## 2014-12-24 NOTE — Telephone Encounter (Signed)
I called pt and let him know that I heard back from his pulmonology office and they suggest bystolic or if too expensive, bisoprolol,  for afib control that hopefully will not aggravate lungs.I am interested in changing from CCB due to reduced EF. Pt will check with pharmacist today and will let me know if cost is ok with bystolic 5 mg qd. After the change, he will need a one week f/u for HR/BP check.

## 2014-12-26 ENCOUNTER — Ambulatory Visit (INDEPENDENT_AMBULATORY_CARE_PROVIDER_SITE_OTHER): Payer: Medicare Other | Admitting: *Deleted

## 2014-12-26 ENCOUNTER — Telehealth (HOSPITAL_COMMUNITY): Payer: Self-pay

## 2014-12-26 DIAGNOSIS — I6350 Cerebral infarction due to unspecified occlusion or stenosis of unspecified cerebral artery: Secondary | ICD-10-CM

## 2014-12-26 DIAGNOSIS — I4891 Unspecified atrial fibrillation: Secondary | ICD-10-CM | POA: Diagnosis not present

## 2014-12-26 DIAGNOSIS — I639 Cerebral infarction, unspecified: Secondary | ICD-10-CM

## 2014-12-26 DIAGNOSIS — Z7901 Long term (current) use of anticoagulants: Secondary | ICD-10-CM

## 2014-12-26 LAB — POCT INR: INR: 1.4

## 2014-12-26 NOTE — Telephone Encounter (Signed)
I have called and left a message with Alfio to inquire about participation in Pulmonary Rehab per Dr. Golden Pop referral. Will send letter in mail and follow up. Requested patient call back.

## 2014-12-29 ENCOUNTER — Telehealth (HOSPITAL_COMMUNITY): Payer: Self-pay

## 2014-12-29 NOTE — Telephone Encounter (Signed)
Called patient regarding entrance to Pulmonary Rehab.  Patient states that they are interested in attending the program.  Patrick Weiss is going to verify insurance coverage and follow up.

## 2014-12-30 ENCOUNTER — Telehealth: Payer: Self-pay | Admitting: Internal Medicine

## 2014-12-30 NOTE — Telephone Encounter (Signed)
Pt returned call - [phone]

## 2014-12-30 NOTE — Telephone Encounter (Signed)
Pt shown how to use inhaler. Nothing further needed

## 2014-12-30 NOTE — Telephone Encounter (Signed)
Called and spoke to pt. Pt stated he needs a nurse to show him how to use the spiriva respimat just to reinforce he is using the inhaler correctly. Pt stated he will come by this afternoon. Nothing further needed at this time.

## 2014-12-30 NOTE — Telephone Encounter (Signed)
LMTC x 1

## 2015-01-01 ENCOUNTER — Ambulatory Visit (INDEPENDENT_AMBULATORY_CARE_PROVIDER_SITE_OTHER): Payer: Medicare Other | Admitting: *Deleted

## 2015-01-01 ENCOUNTER — Encounter (HOSPITAL_COMMUNITY): Payer: Self-pay | Admitting: Nurse Practitioner

## 2015-01-01 ENCOUNTER — Telehealth (HOSPITAL_COMMUNITY): Payer: Self-pay

## 2015-01-01 ENCOUNTER — Ambulatory Visit (HOSPITAL_COMMUNITY)
Admission: RE | Admit: 2015-01-01 | Discharge: 2015-01-01 | Disposition: A | Discharge status: Home / Self Care | Payer: Medicare Other | Source: Ambulatory Visit | Attending: Nurse Practitioner | Admitting: Nurse Practitioner

## 2015-01-01 DIAGNOSIS — Z7901 Long term (current) use of anticoagulants: Secondary | ICD-10-CM

## 2015-01-01 DIAGNOSIS — I4891 Unspecified atrial fibrillation: Secondary | ICD-10-CM

## 2015-01-01 DIAGNOSIS — I639 Cerebral infarction, unspecified: Secondary | ICD-10-CM

## 2015-01-01 DIAGNOSIS — I6350 Cerebral infarction due to unspecified occlusion or stenosis of unspecified cerebral artery: Secondary | ICD-10-CM

## 2015-01-01 LAB — POCT INR: INR: 2.4

## 2015-01-01 NOTE — Progress Notes (Signed)
Patient in for BP check after starting bystolic.  122/86 hr 72. Patient was tolerating medication without symoptom

## 2015-01-01 NOTE — Telephone Encounter (Signed)
Patient called to discuss insurance coverage for pulmonary rehab. Patient states he is covered. Scheduled for orientation July 18 at 9:15

## 2015-01-01 NOTE — Progress Notes (Signed)
Pt tolerating change form CCB to BB well, changed due to LVD.

## 2015-01-05 ENCOUNTER — Other Ambulatory Visit: Payer: Self-pay

## 2015-01-17 ENCOUNTER — Telehealth: Payer: Self-pay | Admitting: Internal Medicine

## 2015-01-17 NOTE — Telephone Encounter (Signed)
Seeing him 03/11/15 - has been seen by Dr Amil Amen 01/14/15- he does not think this is forme fruste ILD of RA

## 2015-01-21 ENCOUNTER — Ambulatory Visit (INDEPENDENT_AMBULATORY_CARE_PROVIDER_SITE_OTHER): Payer: Medicare Other | Admitting: *Deleted

## 2015-01-21 DIAGNOSIS — I639 Cerebral infarction, unspecified: Secondary | ICD-10-CM

## 2015-01-21 DIAGNOSIS — Z7901 Long term (current) use of anticoagulants: Secondary | ICD-10-CM

## 2015-01-21 DIAGNOSIS — I4891 Unspecified atrial fibrillation: Secondary | ICD-10-CM

## 2015-01-21 DIAGNOSIS — I6350 Cerebral infarction due to unspecified occlusion or stenosis of unspecified cerebral artery: Secondary | ICD-10-CM

## 2015-01-21 LAB — POCT INR: INR: 2.1

## 2015-01-26 ENCOUNTER — Encounter (HOSPITAL_COMMUNITY): Payer: Self-pay

## 2015-01-26 ENCOUNTER — Encounter (HOSPITAL_COMMUNITY)
Admission: RE | Admit: 2015-01-26 | Discharge: 2015-01-26 | Disposition: A | Discharge status: Home / Self Care | Payer: Medicare Other | Source: Ambulatory Visit | Attending: Internal Medicine | Admitting: Internal Medicine

## 2015-01-26 VITALS — BP 159/94 | HR 66 | Resp 18 | Ht 69.5 in | Wt 187.6 lb

## 2015-01-26 DIAGNOSIS — J432 Centrilobular emphysema: Secondary | ICD-10-CM | POA: Insufficient documentation

## 2015-01-26 DIAGNOSIS — I639 Cerebral infarction, unspecified: Secondary | ICD-10-CM | POA: Insufficient documentation

## 2015-01-26 NOTE — Progress Notes (Signed)
DERREK PUFF 74 y.o. male Pulmonary Rehab Orientation Note Patient arrived today in [facility] for orientation to Pulmonary Rehab. He ambulated from [employer]. He does not carry portable oxygen. Per pt, he has not been prescribes oxygen for home use. Color good, skin warm and dry. Patient is oriented to time and place. Patient's medical history and medications reviewed. Heart rate is normal, S1S2 present. Lungs clear bilat in upper lobes, fine crackles heard in bases, L>R.Grip strength equal, strong. Distal pulses palpable. Moderate amount of pitting edema noted to both ankles, L>R. Patient states he is not on a fluid restricted diet. Patient reports he does take medications as prescribed. Patient states he follows a Regular diet. The patient reports no specific efforts to gain or lose weight. Patient's weight will be monitored closely. Demonstration and practice of PLB using pulse oximeter. Patient able to return demonstration satisfactorily. Safety and hand hygiene in the exercise area reviewed with patient. Patient voices understanding of the information reviewed. Department expectations discussed with patient and achievable goals were set. The patient shows enthusiasm about attending the program and we look forward to working with this nice gentleman. The patient is scheduled for a 6 min walk test on Thursday July 21 at 3:30 and to begin exercise on Tuesday July 26 in the 10:30 class.   45 minutes was spent on a variety of activities such as assessment of the patient, obtaining baseline data including height, weight, BMI, and grip strength, verifying medical history, allergies, and current medications, and teaching patient strategies for performing tasks with less respiratory effort with emphasis on pursed lip breathing.

## 2015-01-29 ENCOUNTER — Ambulatory Visit (HOSPITAL_COMMUNITY): Payer: Self-pay

## 2015-02-03 ENCOUNTER — Encounter (HOSPITAL_COMMUNITY)
Admission: RE | Admit: 2015-02-03 | Discharge: 2015-02-03 | Disposition: A | Discharge status: Home / Self Care | Payer: Medicare Other | Source: Ambulatory Visit | Attending: Internal Medicine | Admitting: Internal Medicine

## 2015-02-03 DIAGNOSIS — J432 Centrilobular emphysema: Secondary | ICD-10-CM | POA: Diagnosis not present

## 2015-02-03 NOTE — Progress Notes (Signed)
Patrick Weiss completed a Six-Minute Walk Test on 02/03/15 . Patrick Weiss walked 912 feet with 0 breaks.  The patient's lowest oxygen saturation was 83 %, highest heart rate was 94 bpm , and highest blood pressure was 154/98. The patient was on room air to begin with but then placed on 3 liters of oxygen with a nasal cannula when he desaturated. Patient stated that nothing hindered their walk test.

## 2015-02-05 ENCOUNTER — Encounter (HOSPITAL_COMMUNITY)
Admission: RE | Admit: 2015-02-05 | Discharge: 2015-02-05 | Disposition: A | Discharge status: Home / Self Care | Payer: Medicare Other | Source: Ambulatory Visit | Attending: Internal Medicine | Admitting: Internal Medicine

## 2015-02-05 DIAGNOSIS — J432 Centrilobular emphysema: Secondary | ICD-10-CM | POA: Diagnosis not present

## 2015-02-05 NOTE — Progress Notes (Signed)
Today, Elmar exercised at [employer]. Service time was from 1115 to 1315.  The patient exercised by performing aerobic, strengthening, and stretching exercises. Oxygen saturation, heart rate, blood pressure, rate of perceived exertion, and shortness of breath were all monitored before, during, and after exercise. Tilford presented with no problems at today's exercise session.  Patient attended The MD Day, which is a question and answer session for the participants in pulmonary rehab.    The patient did not have an increase in workload intensity during today's exercise session.  Pre-exercise vitals: . Weight kg: 84.7 . Liters of O2: RA . SpO2: 91 . HR: 64 . BP: 122/82 . CBG: na  Exercise vitals: . Highest heartrate:  97 . Lowest oxygen saturation: 86, which increased to 88 with rest and purse lip breathing . Highest blood pressure: 130/92 . Liters of 02: 3  Post-exercise vitals: . SpO2: 98 . HR: 67 . BP: 120/80 . Liters of O2: 3 . CBG: na Dr. Brand Males, Medical Director Dr. Dyann Kief is immediately available during today's Pulmonary Rehab session for PETRA SARGEANT on 02/05/2015  at 1030 class time.  Marland Kitchen

## 2015-02-10 ENCOUNTER — Encounter (HOSPITAL_COMMUNITY)
Admission: RE | Admit: 2015-02-10 | Discharge: 2015-02-10 | Disposition: A | Discharge status: Home / Self Care | Payer: Medicare Other | Source: Ambulatory Visit | Attending: Internal Medicine | Admitting: Internal Medicine

## 2015-02-10 DIAGNOSIS — J432 Centrilobular emphysema: Secondary | ICD-10-CM | POA: Diagnosis present

## 2015-02-10 NOTE — Progress Notes (Signed)
Today, Patrick Weiss exercised at [employer]. Service time was from 10:30am to 12:10pm.  The patient exercised by performing aerobic, strengthening, and stretching exercises. Oxygen saturation, heart rate, blood pressure, rate of perceived exertion, and shortness of breath were all monitored before, during, and after exercise. Patrick Weiss presented with no problems at today's exercise session.  The patient did have an increase in workload intensity during today's exercise session.  Pre-exercise vitals: . Weight kg: 84.6 . Liters of O2: ra . SpO2: 95 . HR: 69 . BP: 140/80 . CBG: na  Exercise vitals: . Highest heartrate:  101 . Lowest oxygen saturation: 87 . Highest blood pressure: 130/80 . Liters of 02: 3  Post-exercise vitals: . SpO2: 95 . HR: 71 . BP: 116/60 . Liters of O2: 3 . CBG: na  Dr. Brand Males, Medical Director Dr. Dyann Kief is immediately available during today's Pulmonary Rehab session for Patrick Weiss on 02/10/15 at 10:30am class time.

## 2015-02-12 ENCOUNTER — Encounter (HOSPITAL_COMMUNITY)
Admission: RE | Admit: 2015-02-12 | Discharge: 2015-02-12 | Disposition: A | Discharge status: Home / Self Care | Payer: Medicare Other | Source: Ambulatory Visit | Attending: Internal Medicine | Admitting: Internal Medicine

## 2015-02-12 DIAGNOSIS — J432 Centrilobular emphysema: Secondary | ICD-10-CM | POA: Diagnosis not present

## 2015-02-12 NOTE — Progress Notes (Signed)
Today, Terique exercised at [employer]. Service time was from 10:30am to 12:10pm.  The patient exercised by performing aerobic, strengthening, and stretching exercises. Oxygen saturation, heart rate, blood pressure, rate of perceived exertion, and shortness of breath were all monitored before, during, and after exercise. Patrick Weiss presented with no problems at today's exercise session. The patient attended education today with [facility] Bethann Qualley on Risk Factors.   The patient did have an increase in workload intensity during today's exercise session.  Pre-exercise vitals: . Weight kg: 85.2 . Liters of O2: ra . SpO2: 96 . HR: 70 . BP: 130/80 . CBG: na  Exercise vitals: . Highest heartrate:  88 . Lowest oxygen saturation: 88 . Highest blood pressure: 132/70 . Liters of 02: 3  Post-exercise vitals: . SpO2: 92 . HR: 69 . BP: 140/94 . Liters of O2: 3 . CBG: na  Dr. Brand Males, Medical Director Dr. Frederic Jericho is immediately available during today's Pulmonary Rehab session for [name] on 02/12/15 at 10:30am class time.

## 2015-02-17 ENCOUNTER — Encounter (HOSPITAL_COMMUNITY)
Admission: RE | Admit: 2015-02-17 | Discharge: 2015-02-17 | Disposition: A | Discharge status: Home / Self Care | Payer: Medicare Other | Source: Ambulatory Visit | Attending: Internal Medicine | Admitting: Internal Medicine

## 2015-02-17 DIAGNOSIS — J432 Centrilobular emphysema: Secondary | ICD-10-CM | POA: Diagnosis not present

## 2015-02-17 NOTE — Progress Notes (Signed)
Today, Ruffin exercised at [employer]. Service time was from 10:30am to 12:10pm.  The patient exercised by performing aerobic, strengthening, and stretching exercises. Oxygen saturation, heart rate, blood pressure, rate of perceived exertion, and shortness of breath were all monitored before, during, and after exercise. Cord presented with no problems at today's exercise session.  The patient did not have an increase in workload intensity during today's exercise session.  Pre-exercise vitals: . Weight kg: 84.7 . Liters of O2: 3 . SpO2: 99 . HR: 67 . BP: 126/50 . CBG: na  Exercise vitals: . Highest heartrate:  85 . Lowest oxygen saturation: 88 . Highest blood pressure: 120/64 . Liters of 02: 3 increased to 4  Post-exercise vitals: . SpO2: 99 . HR: 65 . BP: 138/78 . Liters of O2: 3 . CBG: na  Dr. Brand Males, Medical Director Dr. Frederic Jericho is immediately available during today's Pulmonary Rehab session for Patrick Weiss on 02/17/15 at 10:30am class time.

## 2015-02-18 ENCOUNTER — Ambulatory Visit (INDEPENDENT_AMBULATORY_CARE_PROVIDER_SITE_OTHER): Payer: Medicare Other | Admitting: *Deleted

## 2015-02-18 DIAGNOSIS — Z7901 Long term (current) use of anticoagulants: Secondary | ICD-10-CM | POA: Diagnosis not present

## 2015-02-18 DIAGNOSIS — I639 Cerebral infarction, unspecified: Secondary | ICD-10-CM | POA: Diagnosis not present

## 2015-02-18 DIAGNOSIS — I6350 Cerebral infarction due to unspecified occlusion or stenosis of unspecified cerebral artery: Secondary | ICD-10-CM

## 2015-02-18 DIAGNOSIS — I4891 Unspecified atrial fibrillation: Secondary | ICD-10-CM | POA: Diagnosis not present

## 2015-02-18 LAB — POCT INR: INR: 3

## 2015-02-19 ENCOUNTER — Encounter (HOSPITAL_COMMUNITY)
Admission: RE | Admit: 2015-02-19 | Discharge: 2015-02-19 | Disposition: A | Discharge status: Home / Self Care | Payer: Medicare Other | Source: Ambulatory Visit | Attending: Internal Medicine | Admitting: Internal Medicine

## 2015-02-19 DIAGNOSIS — J432 Centrilobular emphysema: Secondary | ICD-10-CM | POA: Diagnosis not present

## 2015-02-19 NOTE — Progress Notes (Signed)
Today, Patrick Weiss exercised at [employer]. Service time was from 1030 to 1215.  The patient exercised by performing aerobic, strengthening, and stretching exercises. Oxygen saturation, heart rate, blood pressure, rate of perceived exertion, and shortness of breath were all monitored before, during, and after exercise. Patrick Weiss presented with no problems at today's exercise session.He watched lung disease video today.  The patient did  have an increase in workload intensity during today's exercise session.  Pre-exercise vitals: . Weight kg: 84.3 . Liters of O2: 3 . SpO2: 94 . HR: 72 . BP: 114/62 . CBG: NA  Exercise vitals: . Highest heartrate:  89 . Lowest oxygen saturation: 92 . Highest blood pressure: 130/80 . Liters of 02: 2  Post-exercise vitals: . SpO2: 99 . HR: 71 . BP: 100/52 . Liters of O2: 3 . CBG: NA Dr. Brand Males, Medical Director Dr. Sloan Leiter is immediately available during today's Pulmonary Rehab session for Patrick Weiss on ,02/19/2015   at 1030 class time.

## 2015-02-23 ENCOUNTER — Telehealth: Payer: Self-pay | Admitting: Internal Medicine

## 2015-02-23 DIAGNOSIS — J849 Interstitial pulmonary disease, unspecified: Secondary | ICD-10-CM

## 2015-02-23 NOTE — Telephone Encounter (Signed)
2L wiuth exertion. Also set up ONO test onRA

## 2015-02-23 NOTE — Telephone Encounter (Signed)
Go ahead and set up o2 now

## 2015-02-23 NOTE — Telephone Encounter (Signed)
MR please advise on liter flow and frequency.  Thanks!

## 2015-02-23 NOTE — Telephone Encounter (Signed)
lmtcb x1 for pt to make aware of below

## 2015-02-23 NOTE — Telephone Encounter (Signed)
Spoke with Cloyde Reams at [employer],  Pt had a walk test in pulm rehab that showed him dropping to 83% on room air- has been wearing up to 3lpm with exertion, strongly encouraging rests between exertion and pursed lip breathing.  Cloyde Reams is concerned that pt needs an rx for 02 for exertion and home use.  Pt has appt with MR on 03/11/15 and she is requesting that MR consider this with him.    Forwarding to MR to make aware.  Please advise if anything further is needed before pt's OV.  Thanks!

## 2015-02-24 ENCOUNTER — Encounter (HOSPITAL_COMMUNITY)
Admission: RE | Admit: 2015-02-24 | Discharge: 2015-02-24 | Disposition: A | Discharge status: Home / Self Care | Payer: Medicare Other | Source: Ambulatory Visit | Attending: Internal Medicine | Admitting: Internal Medicine

## 2015-02-24 DIAGNOSIS — J432 Centrilobular emphysema: Secondary | ICD-10-CM | POA: Diagnosis not present

## 2015-02-24 NOTE — Progress Notes (Signed)
Today, [name] exercised at [employer]. Service time was from 1030 to 1215.  The patient exercised by performing aerobic, strengthening, and stretching exercises. Oxygen saturation, heart rate, blood pressure, rate of perceived exertion, and shortness of breath were all monitored before, during, and after exercise. [name] presented with no problems at today's exercise session.  The patient did not have an increase in workload intensity during today's exercise session.  Pre-exercise vitals: . Weight kg: 85.5 . Liters of O2: 3 . SpO2: 98 . HR: 73 . BP: 120/62 . CBG: NA  Exercise vitals: . Highest heartrate:  99 . Lowest oxygen saturation: 87 . Highest blood pressure:  . Liters of 02: 4  Post-exercise vitals: . SpO2: 98 . HR: 73 . BP: 124/86 . Liters of O2: 3 . CBG: NA Dr. Brand Males, Medical Director Dr. Sloan Leiter is immediately available during today's Pulmonary Rehab session for Patrick Weiss on 02/24/2015  at 1030 class time.  Marland Kitchen  Marland Kitchen

## 2015-02-24 NOTE — Telephone Encounter (Signed)
Pt returned call. Informed him of the recs per MR. Orders placed. Pt verbalized understanding and denied any further questions or concerns at this time. Called pulmonary rehab and left message with Thayer Headings to let Cloyde Reams know the pt is being set up for exertional O2. Thayer Headings verbalized understanding. Nothing further needed at this time.

## 2015-02-26 ENCOUNTER — Encounter (HOSPITAL_COMMUNITY)
Admission: RE | Admit: 2015-02-26 | Discharge: 2015-02-26 | Disposition: A | Discharge status: Home / Self Care | Payer: Medicare Other | Source: Ambulatory Visit | Attending: Internal Medicine | Admitting: Internal Medicine

## 2015-02-26 ENCOUNTER — Telehealth: Payer: Self-pay | Admitting: Internal Medicine

## 2015-02-26 DIAGNOSIS — J432 Centrilobular emphysema: Secondary | ICD-10-CM | POA: Diagnosis not present

## 2015-02-26 NOTE — Telephone Encounter (Signed)
lmtcb x1 for pt.

## 2015-02-26 NOTE — Progress Notes (Signed)
Today, Patrick Weiss exercised at [employer]. Service time was from 10:30am to 12:45pm.  The patient exercised by performing aerobic, strengthening, and stretching exercises. Oxygen saturation, heart rate, blood pressure, rate of perceived exertion, and shortness of breath were all monitored before, during, and after exercise. Patrick Weiss presented with no problems at today's exercise session. The patient attended education today with Derek Mound the Nutritionist.  The patient did not have an increase in workload intensity during today's exercise session.  Pre-exercise vitals: . Weight kg: 85.0 . Liters of O2: 3 . SpO2: 98 . HR: 66 . BP: 132/90 . CBG: na  Exercise vitals: . Highest heartrate:  107 . Lowest oxygen saturation: 89 . Highest blood pressure: 122/78 . Liters of 02: 3  Post-exercise vitals: . SpO2: 91 . HR: 65 . BP: 124/70 . Liters of O2: ra . CBG: na  Dr. Brand Males, Medical Director Dr. Sloan Leiter is immediately available during today's Pulmonary Rehab session for Patrick Weiss on 02/26/15 at 10:30am class time.

## 2015-02-26 NOTE — Telephone Encounter (Signed)
mandy left different # to be reached which is [phone].Patrick Weiss

## 2015-02-26 NOTE — Telephone Encounter (Signed)
Called spoke with [place] from [place]. An O2 order was placed on pt but Medicare will not pay since he has not had a visit associated with this. Per Leafy Ro they can set up pt with O2 prior but will have to sign a paper since Medicare will not pay for o2 until he has a visit. Pt is scheduled for 8/31 w/ MR and to have PFT done prior. Pt can see TP next week if MR is okay with this and also if he does see TP do you still want the PFT prior to appt?

## 2015-02-26 NOTE — Telephone Encounter (Signed)
Yes got call from rehab - s/w Molly from rehab -   RA at rest 91% sitting down 02/05/15 Today 02/26/2015 - he desaturates easily . On biking 3L he stayed at 90%. On track walking needed 4L to stay at 89%  He feels stable and asymptomatic  PLAN  - please get him in this or next week to see TP in pulmonary office  - please call him 02/26/2015 - he is not to go to rehab for face to face = let him know   Dr. Brand Males, M.D., [facility].C.P Pulmonary and Critical Care Medicine Staff Physician [place] [facility] Pager: [phone], If no answer or between  15:00h - 7:00h: call [phone]  02/26/2015 1:14 PM

## 2015-02-27 ENCOUNTER — Encounter: Payer: Self-pay | Admitting: Adult Health

## 2015-02-27 ENCOUNTER — Ambulatory Visit (INDEPENDENT_AMBULATORY_CARE_PROVIDER_SITE_OTHER): Payer: Medicare Other | Admitting: Adult Health

## 2015-02-27 VITALS — BP 134/68 | HR 80 | Temp 97.5°F | Ht 71.0 in | Wt 188.0 lb

## 2015-02-27 DIAGNOSIS — J439 Emphysema, unspecified: Secondary | ICD-10-CM | POA: Diagnosis not present

## 2015-02-27 DIAGNOSIS — I639 Cerebral infarction, unspecified: Secondary | ICD-10-CM

## 2015-02-27 DIAGNOSIS — J849 Interstitial pulmonary disease, unspecified: Secondary | ICD-10-CM | POA: Diagnosis not present

## 2015-02-27 DIAGNOSIS — J9611 Chronic respiratory failure with hypoxia: Secondary | ICD-10-CM | POA: Diagnosis not present

## 2015-02-27 NOTE — Progress Notes (Signed)
Subjective:    Patient ID: Patrick Weiss, male    DOB: 1940-08-23, 74 y.o.   MRN: [id number]  PCP Foye Spurling, MD 74 yo male with Emphysema and possilbe ILD    TEST :  Lung cut and virtual colonoscopy 02/18/2014: Suggestion of UIP pulmonary fibrosis findings  Pulmonary function test 09/09/2014 prebronchodilator FVC of 3.08 L/81%, FEV1 of 1.99 L/70% a ratio of 65. Spirometry suggests restriction. Postbronchodilator FEV1 of 2.14 L/75% with a mild nonsignificant bronchi dilator response. Total lung capacity of 5.7 L/80% and normal. Diffusion capacity is significantly reduced at 9.5 L/29%  Cardiac echo 40/98/1191: Chronic systolic heart failure with ejection fraction of 40% CT chest on April 14 showed a combination of severe emphysema as well as fibrotic changes in the lungs they have progressed with presence of honeycombing and concerning for progressively worsening usual interstitial pneumonia    02/27/2015 Follow up : COPD and O2 dependent  Pt returns for follow up .  He is doing okay overall  Now in pulmonary rehab. Feels it is helping him .   Recently started on O2 for desats with ambulation and exercise .  Says it really helps.  No fever, chest pain, orthopnea, edema or fever.     Review of Systems  Constitutional: Negative for fever and unexpected weight change.  HENT:  Negative for congestion, dental problem, ear pain, nosebleeds, postnasal drip, sinus pressure, sneezing, sore throat and trouble swallowing.   Eyes: Negative for redness and itching.  Respiratory: Positive   and shortness of breath. Negative for cough, chest tightness and wheezing.   Cardiovascular: Negative for palpitations and leg swelling.  Gastrointestinal: Negative for nausea and vomiting.  Genitourinary: Negative for dysuria.  Musculoskeletal: Negative for joint swelling.  Skin: Negative for rash.  Neurological: Negative for headaches.  Hematological: Does not bruise/bleed easily.    Psychiatric/Behavioral: Negative for dysphoric mood. The patient is not nervous/anxious.        Objective:   Physical Exam  Constitutional: He is oriented to person, place, and time. He appears well-developed and well-nourished. No distress.  HENT:  Head: Normocephalic and atraumatic.  Right Ear: External ear normal.  Left Ear: External ear normal.  Mouth/Throat: Oropharynx is clear and moist. No oropharyngeal exudate.  Eyes: Conjunctivae and EOM are normal. Pupils are equal, round, and reactive to light. Right eye exhibits no discharge. Left eye exhibits no discharge. No scleral icterus.  Neck: Normal range of motion. Neck supple. No JVD present. No tracheal deviation present. No thyromegaly present.  Cardiovascular: Normal rate, regular rhythm and intact distal pulses.  Exam reveals no gallop and no friction rub.   No murmur heard. Pulmonary/Chest: Effort normal and breath sounds normal. No respiratory distress. He has no wheezes. He has no rales. He exhibits no tenderness.  No crackles  Abdominal: Soft. Bowel sounds are normal. He exhibits no distension and no mass. There is no tenderness. There is no rebound and no guarding.  Musculoskeletal: Normal range of motion. He exhibits no edema or tenderness.  No clubbing  Lymphadenopathy:    He has no cervical adenopathy.  Neurological: He is alert and oriented to person, place, and time. He has normal reflexes. No cranial nerve deficit. Coordination normal.  Skin: Skin is warm and dry. No rash noted. He is not diaphoretic. No erythema. No pallor.  Psychiatric: He has a normal mood and affect. His behavior is normal. Judgment and thought content normal.  Nursing note and vitals reviewed.   HRCT 10/27/14  There is a combination of emphysematous changes (severe centrilobular and moderate paraseptal emphysema), as well as fibrotic changes in the lungs, as detailed above. 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       Assessment & Plan:

## 2015-02-27 NOTE — Assessment & Plan Note (Signed)
Check PFT on return  Cont on O2 to keep sats >88-90%/.  Cont with pulmonary rehab

## 2015-02-27 NOTE — Assessment & Plan Note (Signed)
Compensated , doing well in pulmonary rehab   Plan   Continue on Spiriva Respimat 2 puff daily  Continue on Oxygen , goal to keep sats >88-90% Great job with pulmonary rehab  Follow up Dr. Chase Caller for PFT as plannned

## 2015-02-27 NOTE — Telephone Encounter (Signed)
LMTCb x 2

## 2015-02-27 NOTE — Patient Instructions (Signed)
Continue on Spiriva Respimat 2 puff daily  Continue on Oxygen , goal to keep sats >88-90% Great job with pulmonary rehab  Follow up Dr. Chase Caller for PFT as plannned

## 2015-02-27 NOTE — Assessment & Plan Note (Signed)
Cont on O2 to keep sat >88-90%

## 2015-02-27 NOTE — Telephone Encounter (Signed)
Patient returned call, [phone] or [phone].

## 2015-02-27 NOTE — Telephone Encounter (Signed)
Pt scheduled to see TP today at 4:30 (02/27/15) Nothing further needed.

## 2015-03-03 ENCOUNTER — Encounter (HOSPITAL_COMMUNITY)
Admission: RE | Admit: 2015-03-03 | Discharge: 2015-03-03 | Disposition: A | Discharge status: Home / Self Care | Payer: Medicare Other | Source: Ambulatory Visit | Attending: Internal Medicine | Admitting: Internal Medicine

## 2015-03-03 DIAGNOSIS — J432 Centrilobular emphysema: Secondary | ICD-10-CM | POA: Diagnosis not present

## 2015-03-03 NOTE — Progress Notes (Signed)
Today, Patrick Weiss exercised at [employer]. Service time was from 1030 to 1215.  The patient exercised by performing aerobic, strengthening, and stretching exercises. Oxygen saturation, heart rate, blood pressure, rate of perceived exertion, and shortness of breath were all monitored before, during, and after exercise. Patrick Weiss presented with no problems at today's exercise session.  The patient did  have an increase in workload intensity during today's exercise session.  Pre-exercise vitals: . Weight kg: 85.3 . Liters of O2: 3 . SpO2: 91 . HR: 60 . BP: 122/90 . CBG: na  Exercise vitals: . Highest heartrate:  89 . Lowest oxygen saturation: 88 . Highest blood pressure: 126/92 . Liters of 02: 3-4  Post-exercise vitals: . SpO2: 100 . HR: 71 . BP: 114/70 . Liters of O2: RA . CBG: na Dr. Brand Males, Medical Director Dr. Candiss Norse is immediately available during today's Pulmonary Rehab session for Patrick Weiss on 03/03/2015  at 1030 class time.  Marland Kitchen

## 2015-03-05 ENCOUNTER — Encounter (HOSPITAL_COMMUNITY)
Admission: RE | Admit: 2015-03-05 | Discharge: 2015-03-05 | Disposition: A | Discharge status: Home / Self Care | Payer: Medicare Other | Source: Ambulatory Visit | Attending: Internal Medicine | Admitting: Internal Medicine

## 2015-03-05 DIAGNOSIS — J432 Centrilobular emphysema: Secondary | ICD-10-CM | POA: Diagnosis not present

## 2015-03-05 NOTE — Progress Notes (Signed)
Today, Patrick Weiss exercised at [employer]. Service time was from 10:30am to 12:40pm.  The patient exercised by performing aerobic, strengthening, and stretching exercises. Oxygen saturation, heart rate, blood pressure, rate of perceived exertion, and shortness of breath were all monitored before, during, and after exercise. Patrick Weiss presented with no problems at today's exercise session.The patient attended education today with Trish Fountain on Oxygen Safety.  The patient did not have an increase in workload intensity during today's exercise session.  Pre-exercise vitals: . Weight kg: 85.0 . Liters of O2: 3 . SpO2: 98 . HR: 65 . BP: 124/80 . CBG: na  Exercise vitals: . Highest heartrate:  91 . Lowest oxygen saturation: 90 . Highest blood pressure: 132/80 . Liters of 02: 4  Post-exercise vitals: . SpO2: 97 . HR: 71 . BP: 118/60 . Liters of O2: ra . CBG: na  Dr. Brand Males, Medical Director Dr. Candiss Norse is immediately available during today's Pulmonary Rehab session for Patrick Weiss on 03/05/15 at 10:30am class time.

## 2015-03-05 NOTE — Progress Notes (Signed)
I have reviewed a Home Exercise Prescription with Patrick Weiss . Patrick Weiss is walking sporadially currently exercising at home.  The patient was advised to walk 2-3 days a week for 30 minutes.  Patrick Weiss and I discussed how to progress their exercise prescription.  The patient stated that their goals were to be able to climb his stairs without getting so short of breath and be able to get back into playing tennis.  The patient stated that they understand the exercise prescription.  We reviewed exercise guidelines, target heart rate during exercise, oxygen use, weather, home pulse oximeter, endpoints for exercise, and goals.  Patient is encouraged to come to me with any questions. I will continue to follow up with the patient to assist them with progression and safety.

## 2015-03-10 ENCOUNTER — Encounter (HOSPITAL_COMMUNITY)
Admission: RE | Admit: 2015-03-10 | Discharge: 2015-03-10 | Disposition: A | Discharge status: Home / Self Care | Payer: Medicare Other | Source: Ambulatory Visit | Attending: Internal Medicine | Admitting: Internal Medicine

## 2015-03-10 ENCOUNTER — Other Ambulatory Visit: Payer: Self-pay | Admitting: Internal Medicine

## 2015-03-10 DIAGNOSIS — J432 Centrilobular emphysema: Secondary | ICD-10-CM | POA: Diagnosis not present

## 2015-03-10 DIAGNOSIS — R0600 Dyspnea, unspecified: Secondary | ICD-10-CM

## 2015-03-10 NOTE — Progress Notes (Signed)
Today, Patrick Weiss exercised at [employer]. Service time was from 10:30am to 12:05pm.  The patient exercised by performing aerobic, strengthening, and stretching exercises. Oxygen saturation, heart rate, blood pressure, rate of perceived exertion, and shortness of breath were all monitored before, during, and after exercise. Patrick Weiss presented with no problems at today's exercise session.  The patient did not have an increase in workload intensity during today's exercise session.  Pre-exercise vitals: . Weight kg: 85.9 . Liters of O2: ra . SpO2: 89 . HR: 74 . BP: 120/74 . CBG: na  Exercise vitals: . Highest heartrate:  96 . Lowest oxygen saturation: 84% . Highest blood pressure: 122/74 . Liters of 02: 4  Post-exercise vitals: . SpO2: 97 . HR: 82 . BP: 130/90 recheck: 124/82 . Liters of O2: 4 . CBG: na  Dr. Brand Males, Medical Director Dr. Candiss Norse is immediately available during today's Pulmonary Rehab session for Patrick Weiss on 03/10/15 at 10:30am class time.

## 2015-03-11 ENCOUNTER — Ambulatory Visit (INDEPENDENT_AMBULATORY_CARE_PROVIDER_SITE_OTHER): Payer: Medicare Other | Admitting: Internal Medicine

## 2015-03-11 ENCOUNTER — Encounter: Payer: Self-pay | Admitting: Internal Medicine

## 2015-03-11 VITALS — BP 134/86 | HR 77 | Ht 70.0 in | Wt 187.0 lb

## 2015-03-11 DIAGNOSIS — J439 Emphysema, unspecified: Secondary | ICD-10-CM

## 2015-03-11 DIAGNOSIS — J84112 Idiopathic pulmonary fibrosis: Secondary | ICD-10-CM | POA: Diagnosis not present

## 2015-03-11 DIAGNOSIS — R0600 Dyspnea, unspecified: Secondary | ICD-10-CM

## 2015-03-11 DIAGNOSIS — J9611 Chronic respiratory failure with hypoxia: Secondary | ICD-10-CM

## 2015-03-11 DIAGNOSIS — I639 Cerebral infarction, unspecified: Secondary | ICD-10-CM

## 2015-03-11 LAB — PULMONARY FUNCTION TEST
DL/VA % PRED: 41 %
DL/VA: 1.92 ml/min/mmHg/L
DLCO unc % pred: 26 %
DLCO unc: 8.44 ml/min/mmHg
FEF 25-75 Post: 1.14 L/sec
FEF 25-75 Pre: 1.09 L/sec
FEF2575-%CHANGE-POST: 4 %
FEF2575-%PRED-PRE: 46 %
FEF2575-%Pred-Post: 48 %
FEV1-%Change-Post: 2 %
FEV1-%PRED-POST: 75 %
FEV1-%Pred-Pre: 74 %
FEV1-Post: 2.14 L
FEV1-Pre: 2.09 L
FEV1FVC-%Change-Post: 2 %
FEV1FVC-%Pred-Pre: 86 %
FEV6-%CHANGE-POST: 0 %
FEV6-%Pred-Post: 86 %
FEV6-%Pred-Pre: 86 %
FEV6-Post: 3.12 L
FEV6-Pre: 3.09 L
FEV6FVC-%Change-Post: 0 %
FEV6FVC-%Pred-Post: 103 %
FEV6FVC-%Pred-Pre: 103 %
FVC-%CHANGE-POST: 0 %
FVC-%Pred-Post: 84 %
FVC-%Pred-Pre: 83 %
FVC-POST: 3.18 L
FVC-PRE: 3.16 L
POST FEV6/FVC RATIO: 98 %
PRE FEV1/FVC RATIO: 66 %
Post FEV1/FVC ratio: 67 %
Pre FEV6/FVC Ratio: 98 %
RV % pred: 60 %
RV: 1.53 L
TLC % PRED: 66 %
TLC: 4.65 L

## 2015-03-11 MED ORDER — FLUTICASONE PROPIONATE HFA 44 MCG/ACT IN AERO: 2.0000 | INHALATION_SPRAY | Freq: Two times a day (BID) | RESPIRATORY_TRACT | Status: DC

## 2015-03-11 NOTE — Patient Instructions (Addendum)
#  chronic respiratory failure hypoxemic ##Emphysema #Idiopathic pulmonary fibrosis  - You have chronic respiratory hypoxemic failure secondary to emphysema and idiopathic pulmonary fibrosis  - Continue pulmonary rehabilitation - Continue want to 1-2L oxygen at rest and 3-4 liters oxygen with exertion   - Always titrate oxygen to keep pulse ox at 88% or above  - Continue Spiriva 1 puff daily (other inhalers are not covered by her insurance) - Start Flovent 44 g 2 puff 2 times daily - to see if this will help with dyspnea - We discussed IPF treatment and course  - progressive disease with life expecttancy few to several years in most patients but many in some patients   - Take Produvt sheet on the 2 drugs for your personal reading  And instruction below  -  For you  because you are taking Coumadin I would recommend Esbriet once and when you decide to start treatment for IPF   #Followup  1-2 months to discus with me IPF Rx  /..................................   - Both drugs OFEV and Esbriet only slow down progression, 1 out of 6 patients  - this means extension in quality of life but no difference in symptoms  - no study directly compares the 2 drugs but efficacy roughly equal at 1 year time point   - OFEV  - - time to first exacerbation possibly reduced in one trial but not in another - twice daily, no titration, potentially more convenient dosing  - no need for sunscreen  - high chance of mild diarrhea but low chance of significant diarrhea needing to stop medication.   - Rx diarrhea with lomotil - slight increase in heart attack risk and theoretical increase in bleeding risk,   - need monthly blood work for 3 months and then every 6 months - monitor liver function   - ESBRIET  - 3 pill three times daily, slow titration.  - Need to wean sunscreen  - Some chance of nausea and anorexia with small chance for diarrhea  - no known heart attack risk - no known bleeding risk,   -  need monthly blood work for 6 months - monitor liver function - possible mortality benefit in pooled analysis  - larger world wide experience  Disclosure: Dr Chase Caller has participated in trials with Esbriet and has been compensated by [employer] / [employer]

## 2015-03-11 NOTE — Progress Notes (Signed)
PFT done today. 

## 2015-03-11 NOTE — Progress Notes (Signed)
Subjective:    Patient ID: Patrick Weiss, male    DOB: Mar 11, 1941, 74 y.o.   MRN: [id number]  HPI   PCP Patrick Spurling, MD 74 yo male with Emphysema and possilbe ILD    TEST :  Lung cut and virtual colonoscopy 02/18/2014: Suggestion of UIP pulmonary fibrosis findings  Pulmonary function test 09/09/2014 prebronchodilator FVC of 3.08 L/81%, FEV1 of 1.99 L/70% a ratio of 65. Spirometry suggests restriction. Postbronchodilator FEV1 of 2.14 L/75% with a mild nonsignificant bronchi dilator response. Total lung capacity of 5.7 L/80% and normal. Diffusion capacity is significantly reduced at 9.5 L/29%  Cardiac echo 97/41/6384: Chronic systolic heart failure with ejection fraction of 40% CT chest on April 14 showed a combination of severe emphysema as well as fibrotic changes in the lungs they have progressed with presence of honeycombing and concerning for progressively worsening usual interstitial pneumonia    02/27/2015 Follow up : COPD and O2 dependent  Pt returns for follow up .  He is doing okay overall  Now in pulmonary rehab. Feels it is helping him .   Recently started on O2 for desats with ambulation and exercise .  Says it really helps.  No fever, chest pain, orthopnea, edema or fever.    OV 03/11/2015  Chief Complaint  Patient presents with  . Follow-up    Pt states his breathing is unchanged since last OV. Pt saw TP 8.19.16 to have face to face to get portable O2. Pt denies CP/tightness and cough.    Follow-up emphysema with pulmonary fibrosis of UIP pattern with ANA and rheumatoid factor positive fatigue but CCP negative  - Since I last saw him he has attended pulmonary rehabilitation which helps him a lot. He is also taking Spiriva once daily. He does not feel convinced that this is helping him because he does not taste the medication. He is looking for an alternative even if it means a combination therapy. Nevertheless overall since last visit his dyspnea is improved  and is able to climb 2 flights of stairs with oxygen. He notices that at rest 1-2 L is sufficient but in rehabilitation with exercise he would need 3-4 liters of oxygen. He has questions about how much oxygen to use. Of note he did see rheumatologist Dr. Amil Amen on 01/14/2015 reviewed his notes and he does not feel there is any evidence of systemic rheumatoid arthritis nor does he feel that interstitial lung disease is forme fruste of rheumatoid arthritis. We discussed the presence of his pulmonary fibrosis and the diagnosis of IPF,. 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  allopurinol (ZYLOPRIM) 100 MG tablet, Take 100 mg by mouth daily., Disp: , Rfl:  .  Calcium Carbonate-Vitamin D (CALCIUM 600+D) 600-200 MG-UNIT TABS, Take 1 tablet by mouth 2 (two) times daily., Disp: , Rfl:  .  fexofenadine (ALLEGRA) 180 MG tablet, Take 180 mg by mouth daily. , Disp: , Rfl:  .  Naproxen Sodium (ALEVE) 220 MG CAPS, Take 220 mg by mouth daily as needed (pain). , Disp: , Rfl:  .  nebivolol (BYSTOLIC) 5 MG tablet, Take 1 tablet (5 mg total) by mouth daily., Disp: 30 tablet, Rfl: 3 .  SPIRIVA RESPIMAT 2.5 MCG/ACT AERS, Inhale 2 puffs into the  lungs daily. , Disp: , Rfl: 5 .  valsartan-hydrochlorothiazide (DIOVAN-HCT) 320-12.5 MG per tablet, Take 1 tablet by mouth daily., Disp: , Rfl:  .  warfarin (COUMADIN) 5 MG tablet, TAKE AS DIRECTED (Patient taking differently: 1 tablet every day but Wedneday take 1.5 tablet), Disp: 120 tablet, Rfl: 1   Review of Systems  Constitutional: Negative for fever and unexpected weight change.  HENT: Negative for congestion, dental problem, ear pain, nosebleeds, postnasal drip, rhinorrhea, sinus pressure,  sneezing, sore throat and trouble swallowing.   Eyes: Negative for redness and itching.  Respiratory: Positive for shortness of breath. Negative for cough, chest tightness and wheezing.   Cardiovascular: Negative for palpitations and leg swelling.  Gastrointestinal: Negative for nausea and vomiting.  Genitourinary: Negative for dysuria.  Musculoskeletal: Negative for joint swelling.  Skin: Negative for rash.  Neurological: Negative for headaches.  Hematological: Does not bruise/bleed easily.  Psychiatric/Behavioral: Negative for dysphoric mood. The patient is not nervous/anxious.        Objective:   Physical Exam  Constitutional: He is oriented to person, place, and time. He appears well-developed and well-nourished. No distress.  HENT:  Head: Normocephalic and atraumatic.  Right Ear: External ear normal.  Left Ear: External ear normal.  Mouth/Throat: Oropharynx is clear and moist. No oropharyngeal exudate.  o2 on   Eyes: Conjunctivae and EOM are normal. Pupils are equal, round, and reactive to light. Right eye exhibits no discharge. Left eye exhibits no discharge. No scleral icterus.  Neck: Normal range of motion. Neck supple. No JVD present. No tracheal deviation present. No thyromegaly present.  Cardiovascular: Normal rate, regular rhythm and intact distal pulses.  Exam reveals no gallop and no friction rub.   No murmur heard. Pulmonary/Chest: Effort normal. No respiratory distress. He has no wheezes. He has rales. He exhibits no tenderness.  Abdominal: Soft. Bowel sounds are normal. He exhibits no distension and no mass. There is no tenderness. There is no rebound and no guarding.  Musculoskeletal: Normal range of motion. He exhibits no edema or tenderness.  Lymphadenopathy:    He has no cervical adenopathy.  Neurological: He is alert and oriented to person, place, and time. He has normal reflexes. No cranial nerve deficit. Coordination normal.  Skin: Skin is warm and dry. No  rash noted. He is not diaphoretic. No erythema. No pallor.  Psychiatric: He has a normal mood and affect. His behavior is normal. Judgment and thought content normal.  Nursing note and vitals reviewed.    Filed Vitals:   03/11/15 1438  BP: 134/86  Pulse: 77  Height: _0  (1.778 m)  Weight: 187 lb (84.823 kg)  SpO2: 96%        Assessment & Plan:  #chronic respiratory failure hypoxemic ##Emphysema #Idiopathic pulmonary fibrosis  - You have chronic respiratory hypoxemic failure secondary to emphysema and idiopathic pulmonary fibrosis  - Continue pulmonary rehabilitation - Continue want to 1-2L oxygen at rest and 3-4 liters oxygen with exertion   - Always titrate oxygen to keep pulse ox at 88% or above  - Continue Spiriva 1 puff daily (other inhalers are not covered by her insurance) - Start Flovent 44 g 2 puff 2 times daily - to see if this will help with dyspnea - We discussed IPF treatment and course  - progressive disease with life expecttancy few to several years in most patients but many in some patients   - Take Produvt sheet on the 2 drugs for your personal reading  And instruction below  -  For you  because you are taking Coumadin I would recommend Esbriet once and when you decide to start treatment for IPF  - Respect your decision to think and read product insert first before deciding to start Rx   #Followup  1-2 months to discus with me IPF Rx  /..................................   - Both drugs OFEV and Esbriet only slow down progression, 1 out of 6 patients  - this means extension in quality of life but no difference in symptoms  - no study directly compares the 2 drugs but efficacy roughly equal at 1 year time point   - OFEV  - - time to first exacerbation possibly reduced in one trial but not in another - twice daily, no titration, potentially more convenient dosing  - no need for sunscreen  - high chance of mild diarrhea but low chance of significant  diarrhea needing to stop medication.   - Rx diarrhea with lomotil - slight increase in heart attack risk and theoretical increase in bleeding risk,   - need monthly blood work for 3 months and then every 6 months - monitor liver function   - ESBRIET  - 3 pill three times daily, slow titration.  - Need to wean sunscreen  - Some chance of nausea and anorexia with small chance for diarrhea  - no known heart attack risk - no known bleeding risk,   - need monthly blood work for 6 months - monitor liver function - possible mortality benefit in pooled analysis  - larger world wide experience  Disclosure: Dr Chase Caller has participated in trials with Esbriet and has been compensated by [employer] / [employer]     > 50% of this > 25 min visit spent in face to face counseling or coordination of care    Dr. Brand Males, M.D., [facility]C.P Pulmonary and Critical Care Medicine Staff Physician [place] [facility] Pager: [phone], If no answer or between  15:00h - 7:00h: call [phone]  03/11/2015 3:20 PM

## 2015-03-12 ENCOUNTER — Encounter (HOSPITAL_COMMUNITY)
Admission: RE | Admit: 2015-03-12 | Discharge: 2015-03-12 | Disposition: A | Discharge status: Home / Self Care | Payer: Medicare Other | Source: Ambulatory Visit | Attending: Internal Medicine | Admitting: Internal Medicine

## 2015-03-12 ENCOUNTER — Telehealth: Payer: Self-pay | Admitting: Internal Medicine

## 2015-03-12 DIAGNOSIS — J432 Centrilobular emphysema: Secondary | ICD-10-CM | POA: Diagnosis not present

## 2015-03-12 NOTE — Telephone Encounter (Signed)
Called and spoke pt. Pt questioning how often to use the spiriva respimat. Advised pt that the respimat is 2 puffs once a day like he has been doing. Pt verbalized understanding and denied any further questions or concerns at this time.

## 2015-03-12 NOTE — Progress Notes (Signed)
Today, Patrick Weiss exercised at [employer]. Service time was from 1030 to 1215.  The patient exercised by performing aerobic, strengthening, and stretching exercises. Oxygen saturation, heart rate, blood pressure, rate of perceived exertion, and shortness of breath were all monitored before, during, and after exercise. Patrick Weiss presented with no problems at today's exercise session. Patrick Weiss also attended an eduction session with a respiratory therapist discussing pursed lip and diaphragmatic breathing.  The patient did not have an increase in workload intensity during today's exercise session.  Pre-exercise vitals: . Weight kg: 84.6 . Liters of O2: 3L . SpO2: 95 . HR: 71 . BP: 120/70 . CBG: na  Exercise vitals: . Highest heartrate:  95 . Lowest oxygen saturation: 86 increased to 91 with pursed lip breathing . Highest blood pressure: 138/80 . Liters of 02: 4L  Post-exercise vitals: . SpO2: 98 . HR: 70 . BP: 110/60 . Liters of O2: ra . CBG: na  Dr. Brand Males, Medical Director Dr. Carles Collet is immediately available during today's Pulmonary Rehab session for Patrick Weiss on 03/12/2015 at 1030 class time.

## 2015-03-12 NOTE — Telephone Encounter (Signed)
lmomtcb x1 

## 2015-03-12 NOTE — Telephone Encounter (Signed)
Pt cb, [phone]

## 2015-03-17 ENCOUNTER — Encounter (HOSPITAL_COMMUNITY)
Admission: RE | Admit: 2015-03-17 | Discharge: 2015-03-17 | Disposition: A | Discharge status: Home / Self Care | Payer: Medicare Other | Source: Ambulatory Visit | Attending: Internal Medicine | Admitting: Internal Medicine

## 2015-03-17 DIAGNOSIS — J432 Centrilobular emphysema: Secondary | ICD-10-CM | POA: Diagnosis not present

## 2015-03-17 NOTE — Progress Notes (Signed)
Today, Seaborn exercised at [employer]. Service time was from 10:30am to 12:20pm.  The patient exercised by performing aerobic, strengthening, and stretching exercises. Oxygen saturation, heart rate, blood pressure, rate of perceived exertion, and shortness of breath were all monitored before, during, and after exercise. Jaspal presented with no problems at today's exercise session.  The patient did not have an increase in workload intensity during today's exercise session.  Pre-exercise vitals: . Weight kg: 85.4 . Liters of O2: 4 . SpO2: 97 . HR: 63 . BP: 140/90 . CBG: na  Exercise vitals: . Highest heartrate:  90 . Lowest oxygen saturation: 84 . Highest blood pressure: 132/84 . Liters of 02: 4  Post-exercise vitals: . SpO2: 98 . HR: 72 . BP: 151/98 . Liters of O2: 4 . CBG: na  Dr. Brand Males, Medical Director Dr. Carles Collet is immediately available during today's Pulmonary Rehab session for [name] on 03/17/15 at 10:30am class time.

## 2015-03-18 ENCOUNTER — Telehealth: Payer: Self-pay | Admitting: Internal Medicine

## 2015-03-18 ENCOUNTER — Ambulatory Visit (INDEPENDENT_AMBULATORY_CARE_PROVIDER_SITE_OTHER): Payer: Medicare Other | Admitting: *Deleted

## 2015-03-18 DIAGNOSIS — I6350 Cerebral infarction due to unspecified occlusion or stenosis of unspecified cerebral artery: Secondary | ICD-10-CM

## 2015-03-18 DIAGNOSIS — Z7901 Long term (current) use of anticoagulants: Secondary | ICD-10-CM | POA: Diagnosis not present

## 2015-03-18 DIAGNOSIS — I639 Cerebral infarction, unspecified: Secondary | ICD-10-CM

## 2015-03-18 DIAGNOSIS — I4891 Unspecified atrial fibrillation: Secondary | ICD-10-CM

## 2015-03-18 LAB — POCT INR: INR: 3

## 2015-03-18 NOTE — Telephone Encounter (Signed)
Spoke with pt.  He would like [employer] Forms completed. Pt will bring form to office tomorrow.  Once completed, pt would like to pick up.  Will forward to [place] to watch out for forms.

## 2015-03-19 ENCOUNTER — Encounter (HOSPITAL_COMMUNITY)
Admission: RE | Admit: 2015-03-19 | Discharge: 2015-03-19 | Disposition: A | Discharge status: Home / Self Care | Payer: Medicare Other | Source: Ambulatory Visit | Attending: Internal Medicine | Admitting: Internal Medicine

## 2015-03-19 DIAGNOSIS — J432 Centrilobular emphysema: Secondary | ICD-10-CM | POA: Diagnosis not present

## 2015-03-19 NOTE — Telephone Encounter (Signed)
Pt dropped off forms gave to elise

## 2015-03-19 NOTE — Progress Notes (Signed)
Today, [name] exercised at [employer]. Service time was from 1030 to 1225.  The patient exercised by performing aerobic, strengthening, and stretching exercises. Oxygen saturation, heart rate, blood pressure, rate of perceived exertion, and shortness of breath were all monitored before, during, and after exercise. [name] presented with no problems at today's exercise session.He attended [employer] class today.  The patient did not have an increase in workload intensity during today's exercise session.  Pre-exercise vitals: . Weight kg: 86.1 . Liters of O2: 4 . SpO2: 96 . HR: 65 . BP: 110/70 . CBG: NA  Exercise vitals: . Highest heartrate:  86 . Lowest oxygen saturation: 88 . Highest blood pressure: 120/72 . Liters of 02: 4  Post-exercise vitals: . SpO2: 98 . HR: 61 . BP: 120/72 . Liters of O2: 4 . CBG: NA Dr. Brand Males, Medical Director Dr. Candiss Norse is immediately available during today's Pulmonary Rehab session for Patrick Weiss on 03/19/2015  at 1030 class time.  Marland Kitchen

## 2015-03-19 NOTE — Progress Notes (Signed)
Patrick Weiss 74 y.o. male Nutrition Note Spoke with pt. Pt is overweight.Making healthy food choices the majority of the time.  Pt's Rate Your Plate results reviewed with pt. Pt does avoids salty food; does not use canned/ convenience food. Pt does not add salt to food. The role of sodium in lung disease reviewed with pt. Pt expressed understanding of the information reviewed. Pt expressed understanding of the information reviewed. No results found for: HGBA1C  Nutrition Diagnosis ? Food-and nutrition-related knowledge deficit related to lack of exposure to information as related to diagnosis of pulmonary disease ?  Nutrition Intervention ? Pt's individual nutrition plan and goals reviewed with pt. ? Benefits of adopting healthy eating habits discussed when pt's Rate Your Plate reviewed. ? Pt to attend the Nutrition and Lung Disease class ? Continual client-centered nutrition education by RD, as part of interdisciplinary care. Goal(s) 1. Describe the benefit of including fruits, vegetables, whole grains, and low-fat dairy products in a healthy meal plan. Monitor and Evaluate progress toward nutrition goal with team.   Derek Mound, M.Ed, RD, LDN, CDE 03/19/2015 2:28 PM

## 2015-03-20 NOTE — Telephone Encounter (Signed)
Form received will complete and then fax to [place].

## 2015-03-21 DIAGNOSIS — J439 Emphysema, unspecified: Secondary | ICD-10-CM | POA: Insufficient documentation

## 2015-03-21 DIAGNOSIS — J84112 Idiopathic pulmonary fibrosis: Secondary | ICD-10-CM | POA: Insufficient documentation

## 2015-03-21 NOTE — Telephone Encounter (Signed)
Daneil Dan  Did he make up his mind about starting esbriet?  Thanks  Dr. Brand Males, M.D., [facility].C.P Pulmonary and Critical Care Medicine Staff Physician [place] [facility] Pager: [phone], If no answer or between  15:00h - 7:00h: call [phone]  03/21/2015 11:12 PM

## 2015-03-23 NOTE — Telephone Encounter (Signed)
Daneil Dan has form and she is taking care of this. Pt will pick up copies. Will forward to [facility]

## 2015-03-23 NOTE — Telephone Encounter (Signed)
Pt would like to know status of forms. CB at  [phone] or [phone]

## 2015-03-23 NOTE — Telephone Encounter (Signed)
Form completed and faxed to [employer]. Copies left up front for pick up. Nothing further needed at this time.

## 2015-03-24 ENCOUNTER — Encounter (HOSPITAL_COMMUNITY)
Admission: RE | Admit: 2015-03-24 | Discharge: 2015-03-24 | Disposition: A | Discharge status: Home / Self Care | Payer: Medicare Other | Source: Ambulatory Visit | Attending: Internal Medicine | Admitting: Internal Medicine

## 2015-03-24 DIAGNOSIS — J432 Centrilobular emphysema: Secondary | ICD-10-CM | POA: Diagnosis not present

## 2015-03-24 NOTE — Progress Notes (Signed)
Today, Patrick Weiss exercised at [employer]. Service time was from 10:30am to 12:15pm.  The patient exercised by performing aerobic, strengthening, and stretching exercises. Oxygen saturation, heart rate, blood pressure, rate of perceived exertion, and shortness of breath were all monitored before, during, and after exercise. [name] presented with no problems at today's exercise session.  The patient did not have an increase in workload intensity during today's exercise session.  Pre-exercise vitals: . Weight kg: 86.2 . Liters of O2: 4 . SpO2: 96 . HR: 68 . BP: 149/98 recheck: 110/76 . CBG: na  Exercise vitals: . Highest heartrate:  83 . Lowest oxygen saturation: 86 . Highest blood pressure: 122/90 . Liters of 02: 4  Post-exercise vitals: . SpO2: 99 . HR: 71 . BP: 112/62 . Liters of O2: 4 . CBG: na  Dr. Brand Males, Medical Director Dr. Candiss Norse is immediately available during today's Pulmonary Rehab session for Patrick Weiss on 03/24/15 at 10:30am class time.

## 2015-03-26 ENCOUNTER — Encounter (HOSPITAL_COMMUNITY)
Admission: RE | Admit: 2015-03-26 | Discharge: 2015-03-26 | Disposition: A | Discharge status: Home / Self Care | Payer: Medicare Other | Source: Ambulatory Visit | Attending: Internal Medicine | Admitting: Internal Medicine

## 2015-03-26 DIAGNOSIS — J432 Centrilobular emphysema: Secondary | ICD-10-CM | POA: Diagnosis not present

## 2015-03-26 NOTE — Progress Notes (Signed)
Today, Patrick Weiss exercised at [employer]. Service time was from 10:30am to 12:30pm.  The patient exercised by performing aerobic, strengthening, and stretching exercises. Oxygen saturation, heart rate, blood pressure, rate of perceived exertion, and shortness of breath were all monitored before, during, and after exercise. Patrick Weiss presented with no problems at today's exercise session. The patient attended education class today with Cloyde Reams Gualberto Wahlen Registered Clinical Exercise Physiologist on Exercising at Home.  The patient did not have an increase in workload intensity during today's exercise session.  Pre-exercise vitals: . Weight kg: 85.2 . Liters of O2: 4 . SpO2: 98 . HR: 67 . BP: 104/64 . CBG: na  Exercise vitals: . Highest heartrate:  86 . Lowest oxygen saturation: 89 . Highest blood pressure: 124/80 . Liters of 02: 4  Post-exercise vitals: . SpO2: 95 . HR: 69 . BP: 132/58 . Liters of O2: 4 . CBG: na  Dr. Brand Males, Medical Director Dr. Carles Collet is immediately available during today's Pulmonary Rehab session for Patrick Weiss on 03/26/15 at 10:30am class time.

## 2015-03-31 ENCOUNTER — Encounter (HOSPITAL_COMMUNITY)
Admission: RE | Admit: 2015-03-31 | Discharge: 2015-03-31 | Disposition: A | Discharge status: Home / Self Care | Payer: Medicare Other | Source: Ambulatory Visit | Attending: Internal Medicine | Admitting: Internal Medicine

## 2015-03-31 DIAGNOSIS — J432 Centrilobular emphysema: Secondary | ICD-10-CM | POA: Diagnosis not present

## 2015-03-31 NOTE — Progress Notes (Signed)
Today, Patrick Weiss exercised at [employer]. Service time was from 1030 to 1210.  The patient exercised by performing aerobic, strengthening, and stretching exercises. Oxygen saturation, heart rate, blood pressure, rate of perceived exertion, and shortness of breath were all monitored before, during, and after exercise. [name] presented with no problems at today's exercise session.  The patient did  have an increase in workload intensity during today's exercise session.  Pre-exercise vitals: . Weight kg: 84.3 . Liters of O2: 4 . SpO2: 98 . HR: 69 . BP: 122/80 . CBG: na  Exercise vitals: . Highest heartrate:  130/94 . Lowest oxygen saturation: 88 . Highest blood pressure: 130/94 . Liters of 02: 4  Post-exercise vitals: . SpO2: 100 . HR: 67 . BP: 126/76 . Liters of O2: 4 . CBG: na Patrick Weiss, Medical Director Patrick Weiss is immediately available during today's Pulmonary Rehab session for Patrick Weiss on 03/31/2015  at 1030 class time.  Marland Kitchen

## 2015-04-02 ENCOUNTER — Telehealth: Payer: Self-pay | Admitting: Internal Medicine

## 2015-04-02 ENCOUNTER — Encounter (HOSPITAL_COMMUNITY)
Admission: RE | Admit: 2015-04-02 | Discharge: 2015-04-02 | Disposition: A | Discharge status: Home / Self Care | Payer: Medicare Other | Source: Ambulatory Visit | Attending: Internal Medicine | Admitting: Internal Medicine

## 2015-04-02 DIAGNOSIS — J432 Centrilobular emphysema: Secondary | ICD-10-CM | POA: Diagnosis not present

## 2015-04-02 NOTE — Telephone Encounter (Signed)
Spoke with pt, he is requesting a spacer.  Pt is in pulm rehab today and requests we leave a detailed message on the below listed number when his spacer is ready.  MR are you ok with pt receiving a spacer?  He states he requests this for his flovent.    Thanks!

## 2015-04-02 NOTE — Progress Notes (Signed)
Today, Patrick Weiss exercised at [employer]. Service time was from 1030 to 1230.  The patient exercised by performing aerobic, strengthening, and stretching exercises. Oxygen saturation, heart rate, blood pressure, rate of perceived exertion, and shortness of breath were all monitored before, during, and after exercise. Patrick Weiss presented with no problems at today's exercise session. Patrick Weiss also attended an education session on warning signs and symptoms.  The patient did not have an increase in workload intensity during today's exercise session.  Pre-exercise vitals: . Weight kg: 84.9 . Liters of O2: 4L . SpO2: 98 . HR: 66 . BP: 120/78 . CBG: na  Exercise vitals: . Highest heartrate:  83 . Lowest oxygen saturation: 84 . Highest blood pressure: 140/94 . Liters of 02: 4L  Post-exercise vitals: . SpO2: 97 . HR: 58 . BP: 108/70 . Liters of O2: 4L . CBG: na  Dr. Brand Males, Medical Director Dr. Ree Kida is immediately available during today's Pulmonary Rehab session for Patrick Weiss on 04/02/2015 at 1030 class time.

## 2015-04-02 NOTE — Telephone Encounter (Signed)
Ok for spacer 

## 2015-04-02 NOTE — Telephone Encounter (Signed)
Ok for spacer

## 2015-04-03 MED ORDER — AEROCHAMBER MV MISC: Status: DC

## 2015-04-03 NOTE — Telephone Encounter (Signed)
Duplicate message. Please see other phone note from [id number]. Will sign off.

## 2015-04-03 NOTE — Telephone Encounter (Signed)
lmtcb for pt. Spacer placed up front for pick up. Spacer form placed up front for pick up.

## 2015-04-03 NOTE — Addendum Note (Signed)
Addended by: Maurice March on: 04/03/2015 03:50 PM   Modules accepted: Orders

## 2015-04-06 ENCOUNTER — Telehealth: Payer: Self-pay | Admitting: Internal Medicine

## 2015-04-06 NOTE — Telephone Encounter (Signed)
Called and spoke to pt. Pt already has picked up spaced. Nothing further needed at this time.

## 2015-04-06 NOTE — Telephone Encounter (Signed)
Form has been placed in the [facility] folder. Nothing further was needed.

## 2015-04-07 ENCOUNTER — Encounter (HOSPITAL_COMMUNITY)
Admission: RE | Admit: 2015-04-07 | Discharge: 2015-04-07 | Disposition: A | Discharge status: Home / Self Care | Payer: Medicare Other | Source: Ambulatory Visit | Attending: Internal Medicine | Admitting: Internal Medicine

## 2015-04-07 DIAGNOSIS — J432 Centrilobular emphysema: Secondary | ICD-10-CM | POA: Diagnosis not present

## 2015-04-07 NOTE — Progress Notes (Signed)
Today, Patrick Weiss exercised at [employer]. Service time was from 1030 to 1215.  The patient exercised by performing aerobic, strengthening, and stretching exercises. Oxygen saturation, heart rate, blood pressure, rate of perceived exertion, and shortness of breath were all monitored before, during, and after exercise. [name] presented with no problems at today's exercise session.  The patient did not have an increase in workload intensity during today's exercise session.  Pre-exercise vitals: . Weight kg: 84.5 . Liters of O2: 4L . SpO2: 94 . HR: 76 . BP: 114/74 . CBG: na  Exercise vitals: . Highest heartrate:  95 . Lowest oxygen saturation: 87 increased to 90 . Highest blood pressure: 144/86 . Liters of 02: 4L  Post-exercise vitals: . SpO2: 99 . HR: 72 . BP: 122/72 . Liters of O2: 4L . CBG: na  Dr. Brand Males, Medical Director Dr. Ree Kida is immediately available during today's Pulmonary Rehab session for Patrick Weiss on 04/07/2015 at 1030 class time.

## 2015-04-09 ENCOUNTER — Telehealth: Payer: Self-pay | Admitting: Internal Medicine

## 2015-04-09 ENCOUNTER — Encounter (HOSPITAL_COMMUNITY)
Admission: RE | Admit: 2015-04-09 | Discharge: 2015-04-09 | Disposition: A | Discharge status: Home / Self Care | Payer: Medicare Other | Source: Ambulatory Visit | Attending: Internal Medicine | Admitting: Internal Medicine

## 2015-04-09 DIAGNOSIS — J432 Centrilobular emphysema: Secondary | ICD-10-CM | POA: Diagnosis not present

## 2015-04-09 NOTE — Telephone Encounter (Signed)
D/w Portia at rehab 3:12 PM 04/09/2015 -> ok for pulse ox > 85% due to ass IPF. IF push comes to shove, accept > 82% as long as not symptomatic   Dr. Brand Males, M.D., [facility].C.P Pulmonary and Critical Care Medicine Staff Physician [place] [facility] Pager: [phone], If no answer or between  15:00h - 7:00h: call [phone]  04/09/2015 3:13 PM

## 2015-04-09 NOTE — Progress Notes (Signed)
Today, Patrick Weiss exercised at [employer]. Service time was from 1030 to 1240.  The patient exercised by performing aerobic, strengthening, and stretching exercises. Oxygen saturation, heart rate, blood pressure, rate of perceived exertion, and shortness of breath were all monitored before, during, and after exercise. Patrick Weiss presented with no problems at today's exercise session. He attended Anatomy and Physiology class today.  The patient did not have an increase in workload intensity during today's exercise session.  Pre-exercise vitals: . Weight kg: 83.6 . Liters of O2: 4 . SpO2: 99 . HR: 65 . BP: 134/100 . CBG: NA  Exercise vitals: . Highest heartrate:  88 . Lowest oxygen saturation: 87 . Highest blood pressure: 120/90 . Liters of 02: 4  Post-exercise vitals: . SpO2: 98 . HR: 71 . BP: 112/84 . Liters of O2: 4 . CBG: NA Dr. Brand Males, Medical Director Dr. Carles Collet is immediately available during today's Pulmonary Rehab session for Patrick Weiss on 04/09/2015  at 1030 class time.  Marland Kitchen

## 2015-04-14 ENCOUNTER — Telehealth: Payer: Self-pay | Admitting: Internal Medicine

## 2015-04-14 ENCOUNTER — Encounter (HOSPITAL_COMMUNITY)
Admission: RE | Admit: 2015-04-14 | Discharge: 2015-04-14 | Disposition: A | Discharge status: Home / Self Care | Payer: Medicare Other | Source: Ambulatory Visit | Attending: Internal Medicine | Admitting: Internal Medicine

## 2015-04-14 DIAGNOSIS — J432 Centrilobular emphysema: Secondary | ICD-10-CM | POA: Diagnosis present

## 2015-04-14 NOTE — Progress Notes (Signed)
Today, Heber exercised at [employer]. Service time was from 10:30am to 12:10pm.  The patient exercised by performing aerobic, strengthening, and stretching exercises. Oxygen saturation, heart rate, blood pressure, rate of perceived exertion, and shortness of breath were all monitored before, during, and after exercise. Ermon presented with no problems at today's exercise session.  The patient did have an increase in workload intensity during today's exercise session.  Pre-exercise vitals: . Weight kg: 83.3 . Liters of O2: 4 . SpO2: 97 . HR: 71 . BP: 134/76 . CBG: na  Exercise vitals: . Highest heartrate:  98 . Lowest oxygen saturation: 84 . Highest blood pressure: 140/70 . Liters of 02: 4  Post-exercise vitals: . SpO2: 95 . HR: 73 . BP: 118/78 . Liters of O2: 4 . CBG: na  Dr. Brand Males, Medical Director Dr. Allyson Sabal is immediately available during today's Pulmonary Rehab session for [name] on 04/13/15 at 10:30am class time

## 2015-04-14 NOTE — Telephone Encounter (Signed)
Will route message to Daneil Dan to follow up and make sure the forms are received.

## 2015-04-14 NOTE — Telephone Encounter (Signed)
Pt came in to drop off forms for MR i will place in his look at's on A side, [phone]

## 2015-04-16 ENCOUNTER — Encounter (HOSPITAL_COMMUNITY)
Admission: RE | Admit: 2015-04-16 | Discharge: 2015-04-16 | Disposition: A | Discharge status: Home / Self Care | Payer: Medicare Other | Source: Ambulatory Visit | Attending: Internal Medicine | Admitting: Internal Medicine

## 2015-04-16 DIAGNOSIS — J432 Centrilobular emphysema: Secondary | ICD-10-CM | POA: Diagnosis not present

## 2015-04-16 NOTE — Progress Notes (Signed)
Today, Corie exercised at [employer]. Service time was from 10:30am to 12:30pm.  The patient exercised by performing aerobic, strengthening, and stretching exercises. Oxygen saturation, heart rate, blood pressure, rate of perceived exertion, and shortness of breath were all monitored before, during, and after exercise. Tobyn presented with no problems at today's exercise session.  The patient did not have an increase in workload intensity during today's exercise session.  Pre-exercise vitals: . Weight kg: 83.9 . Liters of O2: 4 . SpO2: 97 . HR: 69 . BP: 122/90 . CBG: na  Exercise vitals: . Highest heartrate:  106 . Lowest oxygen saturation: 84 . Highest blood pressure: 122/88 . Liters of 02: 4  Post-exercise vitals: . SpO2: 94 . HR: 75 . BP: 98/64 . Liters of O2: 4 . CBG: na  Dr. Brand Males, Medical Director Dr. Allyson Sabal is immediately available during today's Pulmonary Rehab session for [name] on 04/16/15 at 10:30am class time

## 2015-04-17 NOTE — Telephone Encounter (Signed)
Document received. LMTCB for pt to inform him.

## 2015-04-17 NOTE — Telephone Encounter (Signed)
Pt informed that forms were received and placed for MR to complete .  Will call pt once these are complete.  Will forward to [place] to f/u on.

## 2015-04-18 ENCOUNTER — Other Ambulatory Visit (HOSPITAL_COMMUNITY): Payer: Self-pay | Admitting: Nurse Practitioner

## 2015-04-20 ENCOUNTER — Ambulatory Visit (HOSPITAL_COMMUNITY)
Admission: RE | Admit: 2015-04-20 | Discharge: 2015-04-20 | Disposition: A | Discharge status: Home / Self Care | Payer: Medicare Other | Source: Ambulatory Visit | Attending: Nurse Practitioner | Admitting: Nurse Practitioner

## 2015-04-20 ENCOUNTER — Other Ambulatory Visit: Payer: Self-pay

## 2015-04-20 VITALS — BP 128/88 | HR 75 | Ht 70.0 in | Wt 185.2 lb

## 2015-04-20 DIAGNOSIS — I501 Left ventricular failure: Secondary | ICD-10-CM | POA: Insufficient documentation

## 2015-04-20 DIAGNOSIS — Z8673 Personal history of transient ischemic attack (TIA), and cerebral infarction without residual deficits: Secondary | ICD-10-CM | POA: Insufficient documentation

## 2015-04-20 DIAGNOSIS — I110 Hypertensive heart disease with heart failure: Secondary | ICD-10-CM | POA: Insufficient documentation

## 2015-04-20 DIAGNOSIS — I4891 Unspecified atrial fibrillation: Secondary | ICD-10-CM | POA: Insufficient documentation

## 2015-04-20 DIAGNOSIS — R9431 Abnormal electrocardiogram [ECG] [EKG]: Secondary | ICD-10-CM | POA: Diagnosis not present

## 2015-04-20 DIAGNOSIS — R0609 Other forms of dyspnea: Secondary | ICD-10-CM | POA: Insufficient documentation

## 2015-04-20 NOTE — Telephone Encounter (Signed)
The document is MR's lookats, this document does not need to be completed just to be viewed by MR. Nothing further needed. Will sign off.

## 2015-04-21 ENCOUNTER — Encounter (HOSPITAL_COMMUNITY)
Admission: RE | Admit: 2015-04-21 | Discharge: 2015-04-21 | Disposition: A | Discharge status: Home / Self Care | Payer: Medicare Other | Source: Ambulatory Visit | Attending: Internal Medicine | Admitting: Internal Medicine

## 2015-04-21 ENCOUNTER — Telehealth: Payer: Self-pay | Admitting: *Deleted

## 2015-04-21 ENCOUNTER — Telehealth (HOSPITAL_COMMUNITY): Payer: Self-pay | Admitting: *Deleted

## 2015-04-21 ENCOUNTER — Encounter (HOSPITAL_COMMUNITY): Payer: Self-pay | Admitting: Nurse Practitioner

## 2015-04-21 ENCOUNTER — Other Ambulatory Visit (HOSPITAL_COMMUNITY): Payer: Self-pay | Admitting: *Deleted

## 2015-04-21 DIAGNOSIS — J432 Centrilobular emphysema: Secondary | ICD-10-CM | POA: Diagnosis not present

## 2015-04-21 DIAGNOSIS — R9431 Abnormal electrocardiogram [ECG] [EKG]: Secondary | ICD-10-CM

## 2015-04-21 NOTE — Telephone Encounter (Signed)
Pt notified of cardiac cath scheduled for 10/18 _0  with Dr. Daneen Schick. Instructed to arrive at admitting at 530am.  Lab appointment made for 10/12 _1  for precath lab work.  Coumadin clinic notified of heart cath and what instructions are needed for coumadin prior to cath - has appt with coumadin clinic 10/12 @ 830am.  NPO after midnight, no medications morning of cath and no driving after procedure were discussed as well. Patient verbalized understanding and will receive instructions from coumadin clinic regarding coumadin holding prior to cath.

## 2015-04-21 NOTE — Telephone Encounter (Signed)
Spoke with pt and he is going to come in around 8:30am tomorrow  for lab work and then will return to be seen in coumadin clinic at 3:45pm so will have results of labs if pt is to have lovenox bridge. Megan our Pharmacist has sent message to Dr Tamala Julian regarding this.

## 2015-04-21 NOTE — Progress Notes (Signed)
Patient ID: Patrick Weiss, male   DOB: 08/23/1940, 74 y.o.   MRN: [id number]     PCP:  CLARK,PRESTON S, MD Electrophysiologist: Dr. Allred  The patient presents today for f/u in the afib clinic with h/o PAF. Today, he is in SR.  The patient had undergone evaluation for progressive dyspnea with a stress myoview, echo and pulmonary function testing in February of this year.. He showed evidence for pulmonary HTN, LV dysfunction,n EF 40-45%, as well as significant desaturation to 81% while walking on treadmill. Low risk scan for  ischemia. Stayed in SR on treadmill and reports to his knowledge he is not aware of any rhythm irregularity. Recent virtual colonoscopy suggested honeycombing of the lung bases, consistent with pulmoary fibrosis. He has established with pulmonology, dx'ed with pulmonary fibrosis and returns today wearing O2 by nasal cannula and is in Pulmonary Rehab. He has not noticed any afib. A previous CT of the chest did show evidence for atherosclerosis of the coronary arteries. He denies any chest pain but EKG does show new T wave inversion  in the inferior leads,and more pronounced in the anterolateral leads. He states that he has been issues with his BP dropping following exercise in pulmonary rehab.  Today, he denies symptoms of palpitations, chest pain, orthopnea, PND, lower extremity edema, dizziness, presyncope, syncope, or neurologic sequela. Positive for progressive dyspnea. The patient feels that he is tolerating medications without difficulties and is otherwise without complaint today.   Past Medical History  Diagnosis Date  . A-fib     paroxysmal  . Hypertension   . 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  Current Outpatient Prescriptions  Medication Sig Dispense Refill  . allopurinol (ZYLOPRIM) 100 MG tablet Take 100 mg by mouth daily.    . Calcium Carbonate-Vitamin D  (CALCIUM 600+D) 600-200 MG-UNIT TABS Take 1 tablet by mouth 2 (two) times daily.    . diltiazem (CARDIZEM CD) 240 MG 24 hr capsule Take 240 mg by mouth daily.      . fexofenadine (ALLEGRA) 180 MG tablet Take 180 mg by mouth daily.     . Naproxen Sodium (ALEVE) 220 MG CAPS Take 220 mg by mouth daily as needed (pain).     . valsartan-hydrochlorothiazide (DIOVAN-HCT) 320-12.5 MG per tablet Take 1 tablet by mouth daily.    . warfarin (COUMADIN) 5 MG tablet TAKE AS DIRECTED 120 tablet 1   No current facility-administered medications for this visit.    No Known Allergies  History   Social History  . Marital Status: Married    Spouse Name: N/A  . Number of Children: N/A  . Years of Education: N/A   Occupational History  . Not on file.   Social History Main Topics  . Smoking status: Former Smoker    Types: Cigarettes    Quit date: 09/09/1991  . Smokeless tobacco: Never Used  . Alcohol Use: Yes  . Drug Use: No  . Sexual Activity:    Partners: Female   Other Topics Concern  . Not on file   Social History Narrative    Family History  Problem Relation Age of Onset  . Cancer Father     ROS-  All systems are reviewed and are negative except as outlined in the HPI above   Physical Exam: Filed Vitals:   08/20/14 1054  BP: 138/80  Pulse: 64    Height: 5' 11" (1.803 m)  Weight: 186 lb 9.6 oz (84.641 kg)    GEN- The patient is well appearing, alert and oriented x 3 today.   Head- normocephalic, atraumatic Eyes-  Sclera clear, conjunctiva pink Ears- hearing intact Oropharynx- clear Neck- supple, no JVP Lymph- no cervical lymphadenopathy Lungs- Clear to ausculation bilaterally, normal work of breathing Heart- Regular rate and rhythm, no murmurs, rubs or gallops, PMI not laterally displaced GI- soft, NT, ND, + BS Extremities- no clubbing, cyanosis, or edema MS- no significant deformity or atrophy Skin- no rash or lesion Psych- euthymic mood, full affect Neuro- strength  and sensation are intact  ekg today reveals sinus rhythm, with a rare PVC, right superior axis deviation, ST/T wave abnormality, consider inferior ischemia, ST/T wave abnormality, consider anterolateral ischemia. Compared to previous EKG's, ST changes more prominent.  Virtual colonoscopy Large low-attenuation areas in both kidneys most consistent with cysts some of which are parapelvic. Consider either ultrasound of the kidneys or CT of the abdomen and pelvis with IV contrast to assess further. 2. Subpleural reticulation and honeycombing at the lung bases consistent with pulmonary fibrosis. 3. 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   Myoview-Impression 2/16 Exercise Capacity: There is decreased exercise capacity with extreme shortness of breath early in stress. There was a decrease in O2 saturation with walking as low as 81% BP Response: Normal blood pressure response. Clinical Symptoms: Severe shortness of breath with walking ECG Impression: No significant ST segment change suggestive of ischemia. Comparison with Prior Nuclear Study: No images to compare  Overall Impression: The study is abnormal. It is a low risk scan. There is no definite scar or ischemia. There is mild left ventricular dysfunction. The patient had limited exercise tolerance. There was significant decrease in O2 saturation and marked dyspnea with exercise.  LV Ejection Fraction: 41%. LV Wall Motion: There is mild global hypokinesis.  CT chest:  Echo- Left ventricle: The cavity size was normal. Wall thickness was increased in a pattern of mild LVH. Systolic function was mildly to moderately reduced. The estimated ejection fraction was in the range of 40% to 45%. Diffuse hypokinesis with no identifiable regional variations. Doppler parameters are consistent with abnormal left ventricular relaxation (grade 1 diastolic dysfunction). Indeterminate ventricular filling  pressure by Doppler parameters. - Aortic valve: There was trivial regurgitation. - Tricuspid valve: There was mild-moderate regurgitation directed centrally. - Pulmonary arteries: Systolic pressure was moderately increased. PA peak pressure: 61 mm Hg (S).  CT chest 4/16-IMPRESSION: 1. There is a combination of emphysematous changes (severe centrilobular and moderate paraseptal emphysema), as well as fibrotic changes in the lungs, as detailed above. 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 2. Atherosclerosis, including 3 vessel coronary artery disease. Assessment for potential risk factor modification, dietary therapy or pharmacologic therapy may be warranted, if clinically indicated. 3. Additional incidental findings, as above.  Assessment and Plan:  1. afib IN SR No symptomatic recurrence since last year chads2vasc score is at least 4.  Continue coumadin as recent INRs have been very stable  2. htn Stable 2 gram sodium restriction  3. Prior stroke Continue long term anticoagulation  4. Progressive dyspnea.   Has been dx with pulmonary fibrosis but one has to be concerned with EKG changes and CAD by chest CT if dyspnea may have an anginal component. It is also   concerning that he has had drops in BP lately following exercising with Pulmonary rehab. Discussed with Dr. Allred and he recommended left and rt heart cath with normal stress test just 8 months ago. Will send message to coumadin clinic re upcoming cath.  5. LV dysfunction  Continue BB Continue ARB  F/u in afib clinic one week f/u cath. 

## 2015-04-21 NOTE — Progress Notes (Signed)
Today, Patrick Weiss exercised at [employer]. Service time was from 10:30am to 12:00pm.  The patient exercised by performing aerobic, strengthening, and stretching exercises. Oxygen saturation, heart rate, blood pressure, rate of perceived exertion, and shortness of breath were all monitored before, during, and after exercise. [name] presented with no problems at today's exercise session.  The patient did not have an increase in workload intensity during today's exercise session.  Pre-exercise vitals: . Weight kg: 83.6 . Liters of O2: 4 . SpO2: 99 . HR: 70 . BP: 142/88 . CBG: na  Exercise vitals: . Highest heartrate:  97 . Lowest oxygen saturation: 83 . Highest blood pressure: 122/70 . Liters of 02: 4  Post-exercise vitals: . SpO2: 98 . HR: 63 . BP: 12276 . Liters of O2: 4 . CBG: na  Dr. Brand Males, Medical Director Dr. Marily Memos is immediately available during today's Pulmonary Rehab session for Patrick Weiss on 04/21/15 at 10:30am class time

## 2015-04-22 ENCOUNTER — Ambulatory Visit (INDEPENDENT_AMBULATORY_CARE_PROVIDER_SITE_OTHER): Payer: Medicare Other | Admitting: Pharmacist

## 2015-04-22 ENCOUNTER — Other Ambulatory Visit (INDEPENDENT_AMBULATORY_CARE_PROVIDER_SITE_OTHER): Payer: Medicare Other

## 2015-04-22 ENCOUNTER — Other Ambulatory Visit (HOSPITAL_COMMUNITY): Payer: Medicare Other | Admitting: Nurse Practitioner

## 2015-04-22 DIAGNOSIS — I6350 Cerebral infarction due to unspecified occlusion or stenosis of unspecified cerebral artery: Secondary | ICD-10-CM | POA: Diagnosis not present

## 2015-04-22 DIAGNOSIS — I480 Paroxysmal atrial fibrillation: Secondary | ICD-10-CM

## 2015-04-22 DIAGNOSIS — I4891 Unspecified atrial fibrillation: Secondary | ICD-10-CM

## 2015-04-22 DIAGNOSIS — I10 Essential (primary) hypertension: Secondary | ICD-10-CM | POA: Diagnosis not present

## 2015-04-22 DIAGNOSIS — Z7901 Long term (current) use of anticoagulants: Secondary | ICD-10-CM

## 2015-04-22 LAB — POCT INR: INR: 3

## 2015-04-22 LAB — CBC WITH DIFFERENTIAL/PLATELET
BASOS PCT: 0 % (ref 0–1)
Basophils Absolute: 0 10*3/uL (ref 0.0–0.1)
Eosinophils Absolute: 0.3 10*3/uL (ref 0.0–0.7)
Eosinophils Relative: 5 % (ref 0–5)
HEMATOCRIT: 53.9 % — AB (ref 39.0–52.0)
HEMOGLOBIN: 18.5 g/dL — AB (ref 13.0–17.0)
Lymphocytes Relative: 37 % (ref 12–46)
Lymphs Abs: 1.9 10*3/uL (ref 0.7–4.0)
MCH: 31.4 pg (ref 26.0–34.0)
MCHC: 34.3 g/dL (ref 30.0–36.0)
MCV: 91.5 fL (ref 78.0–100.0)
MONO ABS: 0.6 10*3/uL (ref 0.1–1.0)
MPV: 10.1 fL (ref 8.6–12.4)
Monocytes Relative: 12 % (ref 3–12)
NEUTROS ABS: 2.3 10*3/uL (ref 1.7–7.7)
Neutrophils Relative %: 46 % (ref 43–77)
Platelets: 186 10*3/uL (ref 150–400)
RBC: 5.89 MIL/uL — ABNORMAL HIGH (ref 4.22–5.81)
RDW: 16.2 % — ABNORMAL HIGH (ref 11.5–15.5)
WBC: 5 10*3/uL (ref 4.0–10.5)

## 2015-04-22 LAB — BASIC METABOLIC PANEL
BUN: 21 mg/dL (ref 7–25)
CALCIUM: 9.7 mg/dL (ref 8.6–10.3)
CO2: 29 mmol/L (ref 20–31)
Chloride: 104 mmol/L (ref 98–110)
Creat: 1.46 mg/dL — ABNORMAL HIGH (ref 0.70–1.18)
GLUCOSE: 128 mg/dL — AB (ref 65–99)
Potassium: 4.3 mmol/L (ref 3.5–5.3)
SODIUM: 137 mmol/L (ref 135–146)

## 2015-04-22 MED ORDER — ENOXAPARIN SODIUM 80 MG/0.8ML ~~LOC~~ SOLN: SUBCUTANEOUS | Status: DC

## 2015-04-22 NOTE — Telephone Encounter (Signed)
Spoke with Dr. Tamala Julian who is performing upcoming cardiac cath on 10/18. Patient has CHADS2 score of 3 (HTN and stroke in 2007). Per Dr. Thompson Caul recommendations, comfort level is to still bridge with Lovenox despite no stroke in past 9-10 years since he has not seen the pt before. Will coordinate today in Coumadin clinic.

## 2015-04-22 NOTE — Patient Instructions (Addendum)
Your last dose of Coumadin was today, Wednesday 10/12.   Thursday 10/13: No Coumadin, no Lovenox Friday 10/14: No Coumadin, inject Lovenox 34m subcutaneously at 7am and Lovenox 82mat 7pm Saturday 10/15: No Coumadin, inject Lovenox 8072mubcutaneously at 7am and Lovenox 31m27m 7pm Sunday 10/16: No Coumadin, inject Lovenox 31mg38mcutaneously at 7am and Lovenox 31mg 56mpm Monday 10/17: No Coumadin, inject Lovenox 31mg s72mtaneously at 7am. Do NOT inject any Lovenox in the evening. Tuesday 10/18: Cardiac catheterization. No Lovenox, restart Coumadin 1.5 tablets Wednesday 10/19: Take Coumadin 2 tablets, inject Lovenox 31mg su38maneously at 7am and Lovenox 31mg at 28mThursday 10/20: Take Coumadin 1 tablet, inject Lovenox 31mg subc6meously at 7am and Lovenox 31mg at 7p96miday 10/21: Take Coumadin 1 tablet, inject Lovenox 31mg subcut54musly at 7am and Lovenox 31mg at 7pm 20mrday 10/22: Take Coumadin 1 tablet, inject Lovenox 31mg subcutan96mly at 7am and Lovenox 31mg at 7pm Su76m 10/23: Take Coumadin 1 tablet, inject Lovenox 31mg subcutaneo66m at 7am and Lovenox 31mg at 7pm Mond29m0/24: Recheck INR in Coumadin clinic

## 2015-04-23 ENCOUNTER — Encounter (HOSPITAL_COMMUNITY): Payer: Medicare Other

## 2015-04-23 ENCOUNTER — Telehealth (HOSPITAL_COMMUNITY): Payer: Self-pay

## 2015-04-23 NOTE — Telephone Encounter (Signed)
Returned Patrick Weiss's phone call. He is scheduled to have a right and left heart cath on Tuesday 10/18 for questionable pulm htn and ischemic changes on his EKG. Informed patient that he would be eligable for cardiac rehab if her were to have a coronary artery intervention. Patient expressed understanding. Will put on medical hold from pulmonary rehab until further objective information obtained.

## 2015-04-28 ENCOUNTER — Encounter (HOSPITAL_COMMUNITY): Payer: Self-pay | Admitting: Interventional Cardiology

## 2015-04-28 ENCOUNTER — Encounter (HOSPITAL_COMMUNITY)
Admission: RE | Disposition: A | Discharge status: Home / Self Care | Payer: Self-pay | Source: Ambulatory Visit | Attending: Interventional Cardiology

## 2015-04-28 ENCOUNTER — Ambulatory Visit (HOSPITAL_COMMUNITY)
Admission: RE | Admit: 2015-04-28 | Discharge: 2015-04-28 | Disposition: A | Discharge status: Home / Self Care | Payer: Medicare Other | Source: Ambulatory Visit | Attending: Interventional Cardiology | Admitting: Interventional Cardiology

## 2015-04-28 ENCOUNTER — Encounter (HOSPITAL_COMMUNITY): Payer: Medicare Other

## 2015-04-28 DIAGNOSIS — J439 Emphysema, unspecified: Secondary | ICD-10-CM | POA: Insufficient documentation

## 2015-04-28 DIAGNOSIS — R0689 Other abnormalities of breathing: Secondary | ICD-10-CM

## 2015-04-28 DIAGNOSIS — I110 Hypertensive heart disease with heart failure: Secondary | ICD-10-CM | POA: Diagnosis not present

## 2015-04-28 DIAGNOSIS — Z7901 Long term (current) use of anticoagulants: Secondary | ICD-10-CM | POA: Diagnosis not present

## 2015-04-28 DIAGNOSIS — I480 Paroxysmal atrial fibrillation: Secondary | ICD-10-CM | POA: Diagnosis present

## 2015-04-28 DIAGNOSIS — I5022 Chronic systolic (congestive) heart failure: Secondary | ICD-10-CM | POA: Diagnosis not present

## 2015-04-28 DIAGNOSIS — I2510 Atherosclerotic heart disease of native coronary artery without angina pectoris: Secondary | ICD-10-CM

## 2015-04-28 DIAGNOSIS — Z8673 Personal history of transient ischemic attack (TIA), and cerebral infarction without residual deficits: Secondary | ICD-10-CM | POA: Diagnosis not present

## 2015-04-28 DIAGNOSIS — I4891 Unspecified atrial fibrillation: Secondary | ICD-10-CM | POA: Insufficient documentation

## 2015-04-28 DIAGNOSIS — J849 Interstitial pulmonary disease, unspecified: Secondary | ICD-10-CM | POA: Diagnosis not present

## 2015-04-28 DIAGNOSIS — Z87891 Personal history of nicotine dependence: Secondary | ICD-10-CM | POA: Insufficient documentation

## 2015-04-28 DIAGNOSIS — I272 Other secondary pulmonary hypertension: Secondary | ICD-10-CM | POA: Diagnosis present

## 2015-04-28 DIAGNOSIS — I270 Primary pulmonary hypertension: Secondary | ICD-10-CM

## 2015-04-28 DIAGNOSIS — R9431 Abnormal electrocardiogram [ECG] [EKG]: Secondary | ICD-10-CM

## 2015-04-28 DIAGNOSIS — I10 Essential (primary) hypertension: Secondary | ICD-10-CM | POA: Diagnosis present

## 2015-04-28 DIAGNOSIS — R0600 Dyspnea, unspecified: Secondary | ICD-10-CM | POA: Diagnosis present

## 2015-04-28 HISTORY — PX: CARDIAC CATHETERIZATION: SHX172

## 2015-04-28 LAB — POCT I-STAT 3, ART BLOOD GAS (G3+)
ACID-BASE DEFICIT: 1 mmol/L (ref 0.0–2.0)
Bicarbonate: 24 mEq/L (ref 20.0–24.0)
O2 SAT: 93 %
PO2 ART: 69 mmHg — AB (ref 80.0–100.0)
TCO2: 25 mmol/L (ref 0–100)
pCO2 arterial: 39.9 mmHg (ref 35.0–45.0)
pH, Arterial: 7.387 (ref 7.350–7.450)

## 2015-04-28 LAB — POCT I-STAT 3, VENOUS BLOOD GAS (G3P V)
Acid-base deficit: 2 mmol/L (ref 0.0–2.0)
Bicarbonate: 24.2 mEq/L — ABNORMAL HIGH (ref 20.0–24.0)
O2 Saturation: 74 %
PH VEN: 7.356 — AB (ref 7.250–7.300)
TCO2: 25 mmol/L (ref 0–100)
pCO2, Ven: 43.1 mmHg — ABNORMAL LOW (ref 45.0–50.0)
pO2, Ven: 41 mmHg (ref 30.0–45.0)

## 2015-04-28 LAB — PROTIME-INR
INR: 1 (ref 0.00–1.49)
Prothrombin Time: 13.4 seconds (ref 11.6–15.2)

## 2015-04-28 SURGERY — RIGHT/LEFT HEART CATH AND CORONARY ANGIOGRAPHY
Anesthesia: LOCAL

## 2015-04-28 MED ORDER — SODIUM CHLORIDE 0.9 % WEIGHT BASED INFUSION
3.0000 mL/kg/h | INTRAVENOUS | Status: DC
  Administered 2015-04-28: 3 mL/kg/h via INTRAVENOUS

## 2015-04-28 MED ORDER — LIDOCAINE HCL (PF) 1 % IJ SOLN
INTRAMUSCULAR | Status: DC | PRN
  Administered 2015-04-28: 10 mL via INTRADERMAL

## 2015-04-28 MED ORDER — SODIUM CHLORIDE 0.9 % IJ SOLN: 3.0000 mL | INTRAMUSCULAR | Status: DC | PRN

## 2015-04-28 MED ORDER — HEPARIN (PORCINE) IN NACL 2-0.9 UNIT/ML-% IJ SOLN
INTRAMUSCULAR | Status: AC
  Filled 2015-04-28: qty 1500

## 2015-04-28 MED ORDER — MIDAZOLAM HCL 2 MG/2ML IJ SOLN
INTRAMUSCULAR | Status: DC | PRN
  Administered 2015-04-28: 1 mg via INTRAVENOUS

## 2015-04-28 MED ORDER — HEPARIN SODIUM (PORCINE) 1000 UNIT/ML IJ SOLN
INTRAMUSCULAR | Status: DC | PRN
  Administered 2015-04-28: 4000 [IU] via INTRAVENOUS

## 2015-04-28 MED ORDER — LIDOCAINE HCL (PF) 1 % IJ SOLN
INTRAMUSCULAR | Status: AC
  Filled 2015-04-28: qty 30

## 2015-04-28 MED ORDER — SODIUM CHLORIDE 0.9 % IV SOLN: 250.0000 mL | INTRAVENOUS | Status: DC | PRN

## 2015-04-28 MED ORDER — ASPIRIN 81 MG PO CHEW: 81.0000 mg | CHEWABLE_TABLET | Freq: Every day | ORAL | Status: DC

## 2015-04-28 MED ORDER — SODIUM CHLORIDE 0.9 % WEIGHT BASED INFUSION: 1.0000 mL/kg/h | INTRAVENOUS | Status: DC

## 2015-04-28 MED ORDER — VERAPAMIL HCL 2.5 MG/ML IV SOLN
INTRAVENOUS | Status: DC | PRN
  Administered 2015-04-28: 08:00:00 via INTRA_ARTERIAL

## 2015-04-28 MED ORDER — ONDANSETRON HCL 4 MG/2ML IJ SOLN: 4.0000 mg | Freq: Four times a day (QID) | INTRAMUSCULAR | Status: DC | PRN

## 2015-04-28 MED ORDER — SODIUM CHLORIDE 0.9 % IJ SOLN
3.0000 mL | INTRAMUSCULAR | Status: DC | PRN
Start: 2015-04-28 — End: 2015-04-28

## 2015-04-28 MED ORDER — SODIUM CHLORIDE 0.9 % IJ SOLN: 3.0000 mL | Freq: Two times a day (BID) | INTRAMUSCULAR | Status: DC

## 2015-04-28 MED ORDER — HEPARIN SODIUM (PORCINE) 1000 UNIT/ML IJ SOLN
INTRAMUSCULAR | Status: AC
  Filled 2015-04-28: qty 1

## 2015-04-28 MED ORDER — ACETAMINOPHEN 325 MG PO TABS: 650.0000 mg | ORAL_TABLET | ORAL | Status: DC | PRN

## 2015-04-28 MED ORDER — OXYCODONE-ACETAMINOPHEN 5-325 MG PO TABS: 1.0000 | ORAL_TABLET | ORAL | Status: DC | PRN

## 2015-04-28 MED ORDER — MIDAZOLAM HCL 2 MG/2ML IJ SOLN
INTRAMUSCULAR | Status: AC
  Filled 2015-04-28: qty 4

## 2015-04-28 MED ORDER — IOHEXOL 350 MG/ML SOLN
INTRAVENOUS | Status: DC | PRN
  Administered 2015-04-28: 95 mL via INTRA_ARTERIAL

## 2015-04-28 MED ORDER — VERAPAMIL HCL 2.5 MG/ML IV SOLN
INTRAVENOUS | Status: AC
  Filled 2015-04-28: qty 2

## 2015-04-28 MED ORDER — HEPARIN (PORCINE) IN NACL 2-0.9 UNIT/ML-% IJ SOLN
INTRAMUSCULAR | Status: AC
  Filled 2015-04-28: qty 500

## 2015-04-28 MED ORDER — ASPIRIN 81 MG PO CHEW
CHEWABLE_TABLET | ORAL | Status: DC
Start: 2015-04-28 — End: 2015-04-28
  Filled 2015-04-28: qty 1

## 2015-04-28 MED ORDER — ASPIRIN 81 MG PO CHEW
81.0000 mg | CHEWABLE_TABLET | ORAL | Status: AC
  Administered 2015-04-28: 81 mg via ORAL

## 2015-04-28 MED ORDER — SODIUM CHLORIDE 0.9 % WEIGHT BASED INFUSION
1.0000 mL/kg/h | INTRAVENOUS | Status: DC
  Administered 2015-04-28: 1 mL/kg/h via INTRAVENOUS

## 2015-04-28 MED ORDER — HEPARIN (PORCINE) IN NACL 2-0.9 UNIT/ML-% IJ SOLN
INTRAMUSCULAR | Status: DC | PRN
  Administered 2015-04-28: 09:00:00

## 2015-04-28 SURGICAL SUPPLY — 12 items
CATH BALLN WEDGE 5F 110CM (CATHETERS) ×1 IMPLANT
CATH INFINITI 5 FR JL3.5 (CATHETERS) ×2 IMPLANT
CATH INFINITI JR4 5F (CATHETERS) ×2 IMPLANT
DEVICE RAD COMP TR BAND LRG (VASCULAR PRODUCTS) ×2 IMPLANT
GLIDESHEATH SLEND A-KIT 6F 22G (SHEATH) ×2 IMPLANT
KIT HEART LEFT (KITS) ×2 IMPLANT
KIT HEART RIGHT NAMIC (KITS) ×2 IMPLANT
PACK CARDIAC CATHETERIZATION (CUSTOM PROCEDURE TRAY) ×2 IMPLANT
SHEATH FAST CATH BRACH 5F 5CM (SHEATH) ×1 IMPLANT
TRANSDUCER W/STOPCOCK (MISCELLANEOUS) ×3 IMPLANT
TUBING CIL FLEX 10 FLL-RA (TUBING) ×2 IMPLANT
WIRE SAFE-T 1.5MM-J .035X260CM (WIRE) ×2 IMPLANT

## 2015-04-28 NOTE — Interval H&P Note (Signed)
Cath Lab Visit (complete for each Cath Lab visit)  Clinical Evaluation Leading to the Procedure:   ACS: No.  Non-ACS:    Anginal Classification: CCS III  Anti-ischemic medical therapy: No Therapy  Non-Invasive Test Results: Low-risk stress test findings: cardiac mortality <1%/year  Prior CABG: No previous CABG      History and Physical Interval Note:  04/28/2015 7:12 AM  Patrick Weiss  has presented today for surgery, with the diagnosis of abnormal EKG, sob  The various methods of treatment have been discussed with the patient and family. After consideration of risks, benefits and other options for treatment, the patient has consented to  Procedure(s): Right/Left Heart Cath and Coronary Angiography (N/A) as a surgical intervention .  The patient's history has been reviewed, patient examined, no change in status, stable for surgery.  I have reviewed the patient's chart and labs.  Questions were answered to the patient's satisfaction.     Sinclair Grooms

## 2015-04-28 NOTE — Discharge Instructions (Signed)
Radial Site Care Refer to this sheet in the next few weeks. These instructions provide you with information about caring for yourself after your procedure. Your health care provider may also give you more specific instructions. Your treatment has been planned according to current medical practices, but problems sometimes occur. Call your health care provider if you have any problems or questions after your procedure. WHAT TO EXPECT AFTER THE PROCEDURE After your procedure, it is typical to have the following:  Bruising at the radial site that usually fades within 1-2 weeks.  Blood collecting in the tissue (hematoma) that may be painful to the touch. It should usually decrease in size and tenderness within 1-2 weeks. HOME CARE INSTRUCTIONS  Take medicines only as directed by your health care provider.  You may shower 24-48 hours after the procedure or as directed by your health care provider. Remove the bandage (dressing) and gently wash the site with plain soap and water. Pat the area dry with a clean towel. Do not rub the site, because this may cause bleeding.  Do not take baths, swim, or use a hot tub until your health care provider approves.  Check your insertion site every day for redness, swelling, or drainage.  Do not apply powder or lotion to the site.  Do not flex or bend the affected arm for 24 hours or as directed by your health care provider.  Do not push or pull heavy objects with the affected arm for 24 hours or as directed by your health care provider.  Do not lift over 10 lb (4.5 kg) for 5 days after your procedure or as directed by your health care provider.  Ask your health care provider when it is okay to:  Return to work or school.  Resume usual physical activities or sports.  Resume sexual activity.  Do not drive home if you are discharged the same day as the procedure. Have someone else drive you.  You may drive 24 hours after the procedure unless otherwise  instructed by your health care provider.  Do not operate machinery or power tools for 24 hours after the procedure.  If your procedure was done as an outpatient procedure, which means that you went home the same day as your procedure, a responsible adult should be with you for the first 24 hours after you arrive home.  Keep all follow-up visits as directed by your health care provider. This is important. SEEK MEDICAL CARE IF:  You have a fever.  You have chills.  You have increased bleeding from the radial site. Hold pressure on the site. SEEK IMMEDIATE MEDICAL CARE IF:  You have unusual pain at the radial site.  You have redness, warmth, or swelling at the radial site.  You have drainage (other than a small amount of blood on the dressing) from the radial site.  The radial site is bleeding, and the bleeding does not stop after 30 minutes of holding steady pressure on the site.  Your arm or hand becomes pale, cool, tingly, or numb.   This information is not intended to replace advice given to you by your health care provider. Make sure you discuss any questions you have with your health care provider.   Document Released: 07/30/2010 Document Revised: 07/18/2014 Document Reviewed: 01/13/2014 Elsevier Interactive Patient Education [employer].

## 2015-04-28 NOTE — H&P (View-Only) (Signed)
Patient ID: Patrick Weiss, male   DOB: Mar 15, 1941, 74 y.o.   MRN: [id number]     PCP:  Foye Spurling, MD Electrophysiologist: Dr. Rayann Heman  The patient presents today for f/u in the afib clinic with h/o PAF. Today, he is in SR.  The patient had undergone evaluation for progressive dyspnea with a stress myoview, echo and pulmonary function testing in February of this year.Marland Kitchen He showed evidence for pulmonary HTN, LV dysfunction,n EF 40-45%, as well as significant desaturation to 81% while walking on treadmill. Low risk scan for  ischemia. Stayed in SR on treadmill and reports to his knowledge he is not aware of any rhythm irregularity. Recent virtual colonoscopy suggested honeycombing of the lung bases, consistent with pulmoary fibrosis. He has established with pulmonology, dx'ed with pulmonary fibrosis and returns today wearing O2 by nasal cannula and is in Pulmonary Rehab. He has not noticed any afib. A previous CT of the chest did show evidence for atherosclerosis of the coronary arteries. He denies any chest pain but EKG does show new T wave inversion  in the inferior leads,and more pronounced in the anterolateral leads. He states that he has been issues with his BP dropping following exercise in pulmonary rehab.  Today, he denies symptoms of palpitations, chest pain, orthopnea, PND, lower extremity edema, dizziness, presyncope, syncope, or neurologic sequela. Positive for progressive dyspnea. The patient feels that he is tolerating medications without difficulties and is otherwise without complaint today.   Past Medical History  Diagnosis Date  . A-fib     paroxysmal  . Hypertension   . 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  Current Outpatient Prescriptions  Medication Sig Dispense Refill  . allopurinol (ZYLOPRIM) 100 MG tablet Take 100 mg by mouth daily.    . Calcium Carbonate-Vitamin D  (CALCIUM 600+D) 600-200 MG-UNIT TABS Take 1 tablet by mouth 2 (two) times daily.    Marland Kitchen diltiazem (CARDIZEM CD) 240 MG 24 hr capsule Take 240 mg by mouth daily.      . fexofenadine (ALLEGRA) 180 MG tablet Take 180 mg by mouth daily.     . Naproxen Sodium (ALEVE) 220 MG CAPS Take 220 mg by mouth daily as needed (pain).     . valsartan-hydrochlorothiazide (DIOVAN-HCT) 320-12.5 MG per tablet Take 1 tablet by mouth daily.    Marland Kitchen warfarin (COUMADIN) 5 MG tablet TAKE AS DIRECTED 120 tablet 1   No current facility-administered medications for this visit.    No Known Allergies  History   Social History  . Marital Status: Married    Spouse Name: N/A  . Number of Children: N/A  . Years of Education: N/A   Occupational History  . Not on file.   Social History Main Topics  . Smoking status: Former Smoker    Types: Cigarettes    Quit date: 09/09/1991  . Smokeless tobacco: Never Used  . Alcohol Use: Yes  . Drug Use: No  . Sexual Activity:    Partners: Female   Other Topics Concern  . Not on file   Social History Narrative    Family History  Problem Relation Age of Onset  . Cancer Father     ROS-  All systems are reviewed and are negative except as outlined in the HPI above   Physical Exam: Filed Vitals:   08/20/14 1054  BP: 138/80  Pulse: 64  Height: _0  (1.803 m)  Weight: 186 lb 9.6 oz (84.641 kg)    GEN- The patient is well appearing, alert and oriented x 3 today.   Head- normocephalic, atraumatic Eyes-  Sclera clear, conjunctiva pink Ears- hearing intact Oropharynx- clear Neck- supple, no JVP Lymph- no cervical lymphadenopathy Lungs- Clear to ausculation bilaterally, normal work of breathing Heart- Regular rate and rhythm, no murmurs, rubs or gallops, PMI not laterally displaced GI- soft, NT, ND, + BS Extremities- no clubbing, cyanosis, or edema MS- no significant deformity or atrophy Skin- no rash or lesion Psych- euthymic mood, full affect Neuro- strength  and sensation are intact  ekg today reveals sinus rhythm, with a rare PVC, right superior axis deviation, ST/T wave abnormality, consider inferior ischemia, ST/T wave abnormality, consider anterolateral ischemia. Compared to previous EKG's, ST changes more prominent.  Virtual colonoscopy Large low-attenuation areas in both kidneys most consistent with cysts some of which are parapelvic. Consider either ultrasound of the kidneys or CT of the abdomen and pelvis with IV contrast to assess further. 2. Subpleural reticulation and honeycombing at the lung bases consistent with pulmonary fibrosis. 3. 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   Myoview-Impression 2/16 Exercise Capacity: There is decreased exercise capacity with extreme shortness of breath early in stress. There was a decrease in O2 saturation with walking as low as 81% BP Response: Normal blood pressure response. Clinical Symptoms: Severe shortness of breath with walking ECG Impression: No significant ST segment change suggestive of ischemia. Comparison with Prior Nuclear Study: No images to compare  Overall Impression: The study is abnormal. It is a low risk scan. There is no definite scar or ischemia. There is mild left ventricular dysfunction. The patient had limited exercise tolerance. There was significant decrease in O2 saturation and marked dyspnea with exercise.  LV Ejection Fraction: 41%. LV Wall Motion: There is mild global hypokinesis.  CT chest:  Echo- Left ventricle: The cavity size was normal. Wall thickness was increased in a pattern of mild LVH. Systolic function was mildly to moderately reduced. The estimated ejection fraction was in the range of 40% to 45%. Diffuse hypokinesis with no identifiable regional variations. Doppler parameters are consistent with abnormal left ventricular relaxation (grade 1 diastolic dysfunction). Indeterminate ventricular filling  pressure by Doppler parameters. - Aortic valve: There was trivial regurgitation. - Tricuspid valve: There was mild-moderate regurgitation directed centrally. - Pulmonary arteries: Systolic pressure was moderately increased. PA peak pressure: 61 mm Hg (S).  CT chest 4/16-IMPRESSION: 1. There is a combination of emphysematous changes (severe centrilobular and moderate paraseptal emphysema), as well as fibrotic changes in the lungs, as detailed above. 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 2. Atherosclerosis, including 3 vessel coronary artery disease. Assessment for potential risk factor modification, dietary therapy or pharmacologic therapy may be warranted, if clinically indicated. 3. Additional incidental findings, as above.  Assessment and Plan:  1. afib IN SR No symptomatic recurrence since last year chads2vasc score is at least 4.  Continue coumadin as recent INRs have been very stable  2. htn Stable 2 gram sodium restriction  3. Prior stroke Continue long term anticoagulation  4. Progressive dyspnea.   Has been dx with pulmonary fibrosis but one has to be concerned with EKG changes and CAD by chest CT if dyspnea may have an anginal component. It is also  concerning that he has had drops in BP lately following exercising with Pulmonary rehab. Discussed with Dr. Rayann Heman and he recommended left and rt heart cath with normal stress test just 8 months ago. Will send message to coumadin clinic re upcoming cath.  5. LV dysfunction  Continue BB Continue ARB  F/u in afib clinic one week f/u cath.

## 2015-04-30 ENCOUNTER — Telehealth (HOSPITAL_COMMUNITY): Payer: Self-pay

## 2015-04-30 ENCOUNTER — Encounter (HOSPITAL_COMMUNITY): Payer: Medicare Other

## 2015-05-04 ENCOUNTER — Ambulatory Visit (INDEPENDENT_AMBULATORY_CARE_PROVIDER_SITE_OTHER): Payer: Medicare Other | Admitting: *Deleted

## 2015-05-04 DIAGNOSIS — I4891 Unspecified atrial fibrillation: Secondary | ICD-10-CM

## 2015-05-04 DIAGNOSIS — I480 Paroxysmal atrial fibrillation: Secondary | ICD-10-CM | POA: Diagnosis not present

## 2015-05-04 DIAGNOSIS — Z7901 Long term (current) use of anticoagulants: Secondary | ICD-10-CM

## 2015-05-04 DIAGNOSIS — I6350 Cerebral infarction due to unspecified occlusion or stenosis of unspecified cerebral artery: Secondary | ICD-10-CM

## 2015-05-04 LAB — POCT INR: INR: 2.1

## 2015-05-05 ENCOUNTER — Encounter (HOSPITAL_COMMUNITY): Payer: Medicare Other

## 2015-05-07 ENCOUNTER — Encounter (HOSPITAL_COMMUNITY): Payer: Medicare Other

## 2015-05-08 ENCOUNTER — Other Ambulatory Visit: Payer: Self-pay | Admitting: *Deleted

## 2015-05-08 ENCOUNTER — Ambulatory Visit (INDEPENDENT_AMBULATORY_CARE_PROVIDER_SITE_OTHER): Payer: Medicare Other | Admitting: Nurse Practitioner

## 2015-05-08 ENCOUNTER — Encounter: Payer: Self-pay | Admitting: Nurse Practitioner

## 2015-05-08 VITALS — BP 130/100 | HR 78 | Ht 70.0 in | Wt 182.0 lb

## 2015-05-08 DIAGNOSIS — R0600 Dyspnea, unspecified: Secondary | ICD-10-CM | POA: Diagnosis not present

## 2015-05-08 DIAGNOSIS — N189 Chronic kidney disease, unspecified: Secondary | ICD-10-CM

## 2015-05-08 DIAGNOSIS — I6350 Cerebral infarction due to unspecified occlusion or stenosis of unspecified cerebral artery: Secondary | ICD-10-CM | POA: Diagnosis not present

## 2015-05-08 DIAGNOSIS — I4891 Unspecified atrial fibrillation: Secondary | ICD-10-CM

## 2015-05-08 DIAGNOSIS — E785 Hyperlipidemia, unspecified: Secondary | ICD-10-CM

## 2015-05-08 LAB — LIPID PANEL
Cholesterol: 122 mg/dL — ABNORMAL LOW (ref 125–200)
HDL: 31 mg/dL — ABNORMAL LOW (ref >=40)
LDL Cholesterol: 78 mg/dL (ref <130)
Total CHOL/HDL Ratio: 3.9 Ratio (ref <=5.0)
Triglycerides: 67 mg/dL (ref <150)
VLDL: 13 mg/dL (ref <30)

## 2015-05-08 LAB — HEPATIC FUNCTION PANEL
ALT: 26 U/L (ref 9–46)
AST: 22 U/L (ref 10–35)
Albumin: 3.5 g/dL — ABNORMAL LOW (ref 3.6–5.1)
Alkaline Phosphatase: 43 U/L (ref 40–115)
Bilirubin, Direct: 0.1 mg/dL (ref <=0.2)
Indirect Bilirubin: 0.5 mg/dL (ref 0.2–1.2)
Total Bilirubin: 0.6 mg/dL (ref 0.2–1.2)
Total Protein: 6.1 g/dL (ref 6.1–8.1)

## 2015-05-08 LAB — BASIC METABOLIC PANEL
BUN: 22 mg/dL (ref 7–25)
CO2: 27 mmol/L (ref 20–31)
Calcium: 9.1 mg/dL (ref 8.6–10.3)
Chloride: 106 mmol/L (ref 98–110)
Creat: 1.65 mg/dL — ABNORMAL HIGH (ref 0.70–1.18)
Glucose, Bld: 87 mg/dL (ref 65–99)
Potassium: 4.8 mmol/L (ref 3.5–5.3)
Sodium: 140 mmol/L (ref 135–146)

## 2015-05-08 MED ORDER — ATORVASTATIN CALCIUM 10 MG PO TABS: 10.0000 mg | ORAL_TABLET | Freq: Every day | ORAL | Status: DC

## 2015-05-08 NOTE — Patient Instructions (Addendum)
We will be checking the following labs today - BMET and lipids and HPF  Lab in 6 weeks - HPF and lipids   Medication Instructions:    Continue with your current medicines. But I am   Adding Lipitor 10 mg a day for your cholesterol    Testing/Procedures To Be Arranged:  N/A  Follow-Up:   See Dr. Tamala Julian in 4 months    Other Special Instructions:   We will call [facility] to get your follow up rescheduled.   Ok to resume your pulmonary rehab.     If you need a refill on your cardiac medications before your next appointment, please call your pharmacy.   Call the [place] office at [phone] if you have any questions, problems or concerns.

## 2015-05-08 NOTE — Progress Notes (Signed)
CARDIOLOGY OFFICE NOTE  Date:  05/08/2015    Patrick Weiss Date of Birth: 04-03-1941 Medical Record #[id number]  PCP:  Foye Spurling, MD  Cardiologist:  Tamala Julian    Chief Complaint  Patient presents with  . S/P cardiac catheterization    Seen for Dr. Tamala Julian  . Shortness of Breath    History of Present Illness: Patrick Weiss is a 74 y.o. male who presents today for a post cardiac cath visit. Seen for Dr. Tamala Julian and Dr. Rayann Heman. He has a history of PAF, HTN, prior stroke, and shortness of breath.   Has had progressive dyspnea with extensive evaluation. Recent cath. Severe pulmonary HTN noted. Followed by pulmonary as well.   Comes back today. Here alone. Has oxygen in place but took off and his sat was 85% within just a few minutes. Doing ok. No chest pain. Breathing is about the same - but no worse. He is in pulmonary rehab as well but has not been going due to recent cath. He would like to resume. His rhythm has been ok as far as he knows. More concerned about missing his follow up at [facility] - could not get rescheduled.    Past Medical History  Diagnosis Date  . A-fib (HCC)     paroxysmal  . Hypertension   . 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 Back surgery  2001  . Tibia fracture surgery  1975  . Cardiac catheterization N/A 04/28/2015    Procedure: Right/Left Heart Cath and Coronary Angiography;  Surgeon: Belva Crome, MD;  Location: [place] [facility];  Service: Cardiovascular;  Laterality: N/A;     Medications: Current Outpatient Prescriptions  Medication Sig Dispense Refill  . allopurinol (ZYLOPRIM) 100 MG tablet Take 100 mg by mouth daily.    Marland Kitchen BYSTOLIC 5 MG tablet TAKE 1 TABLET BY MOUTH DAILY (Patient taking differently: Take 5 mg by mouth daily) 30 tablet 11  . Calcium Carbonate-Vitamin D (CALCIUM 600+D) 600-200 MG-UNIT TABS  Take 1 tablet by mouth 2 (two) times daily.    . Carboxymethylcellulose Sodium (REFRESH TEARS OP) Apply to eye.    . enoxaparin (LOVENOX) 80 MG/0.8ML injection Follow separate dosing instructions for cardiac catheterization on 10/18. 20 Syringe 0  . fexofenadine (ALLEGRA) 180 MG tablet Take 180 mg by mouth daily as needed for allergies.     . fluticasone (FLOVENT HFA) 44 MCG/ACT inhaler Inhale 2 puffs into the lungs 2 (two) times daily. 1 Inhaler 12  . Naproxen Sodium (ALEVE) 220 MG CAPS Take 440 mg by mouth daily as needed (pain).     . NON FORMULARY Place 3 L into the nose continuous.    Marland Kitchen Spacer/Aero-Holding Chambers (AEROCHAMBER MV) inhaler Use as instructed 1 each 0  . SPIRIVA RESPIMAT 2.5 MCG/ACT AERS Inhale 2 puffs into the lungs daily.   5  . valsartan-hydrochlorothiazide (DIOVAN-HCT) 320-12.5 MG per tablet Take 1 tablet by mouth daily.    Marland Kitchen warfarin (COUMADIN) 5 MG tablet TAKE AS DIRECTED (Patient taking differently: Take 5 mg by mouth daily except take 7.5 mg by mouth on wednesday.) 120 tablet 1  . atorvastatin (LIPITOR) 10 MG tablet Take 1 tablet (10 mg total) by mouth daily. 90 tablet 3   No current facility-administered medications for this visit.    Allergies: No Known Allergies  Social History: The patient  reports that he quit smoking about 23 years ago. His smoking use included Cigarettes. He has a 30 pack-year smoking history. He has never used smokeless tobacco. He reports that he drinks alcohol. He reports that he does not use illicit drugs.   Family History: The patient's family history includes Cancer in his father.   Review of Systems: Please see the history of present illness.   Otherwise, the review of systems is positive for none.   All other systems are reviewed and negative.   Physical Exam: VS:  BP 130/100 mmHg  Pulse 78  Ht _0  (1.778 m)  Wt 182 lb (82.555 kg)  BMI 26.11 kg/m2  SpO2 85% .  BMI Body mass index is 26.11 kg/(m^2).  Wt Readings from  Last 3 Encounters:  05/08/15 182 lb (82.555 kg)  04/28/15 185 lb (83.915 kg)  04/20/15 185 lb 3.2 oz (84.006 kg)   BP by me is 110/70.   General: Pleasant. Well developed, well nourished and in no acute distress.  HEENT: Normal. Neck: Supple, no JVD, carotid bruits, or masses noted.  Cardiac: Irregular rhythm. Rate is ok. ?AF. No edema.  Respiratory:  Lungs are clear to auscultation bilaterally with normal work of breathing.  GI: Soft and nontender.  MS: No deformity or atrophy. Gait and ROM intact. Skin: Warm and dry. Color is normal.  Neuro:  Strength and sensation are intact and no gross focal deficits noted.  Psych: Alert, appropriate and with normal affect. Cath site from right wrist is ok.    LABORATORY DATA:  EKG:  EKG is ordered today. This shows NSR.   Lab Results  Component Value Date   WBC 5.0 04/22/2015   HGB 18.5* 04/22/2015   HCT 53.9* 04/22/2015   PLT 186 04/22/2015   GLUCOSE 128* 04/22/2015   CHOL 176 08/19/2013   TRIG 66.0 08/19/2013   HDL 42.00 08/19/2013   LDLCALC 121* 08/19/2013   ALT 11 08/19/2013   AST 14 08/19/2013   NA 137 04/22/2015   K 4.3 04/22/2015   CL 104 04/22/2015   CREATININE 1.46* 04/22/2015   BUN 21 04/22/2015   CO2 29 04/22/2015   TSH 1.23 02/02/2010   INR 2.1 05/04/2015   Lab Results  Component Value Date   INR 2.1 05/04/2015   INR 1.00 04/28/2015   INR 3.0 04/22/2015    BNP (last 3 results) No results for input(s): BNP in the last 8760 hours.  ProBNP (last 3 results) No results for input(s): PROBNP in the last 8760 hours.   Other Studies Reviewed Today: Procedures    Right/Left Heart Cath and Coronary Angiography    Conclusion    1. Mid RCA to Dist RCA lesion, 65% stenosed. 2. 1st RPLB lesion, 70% stenosed. 3. Ost Cx to Prox Cx lesion, 50% stenosed. 4. Mid Cx to Dist Cx lesion, 65% stenosed. 5. Prox LAD to Dist LAD lesion, 45% stenosed. 6. 1st Diag lesion, 85% stenosed.   Chronic stable coronary artery  disease with lesions as noted above. There is moderate to moderately severe involvement in all 3 territories.  Chronic systolic heart failure with normal left ventricular end-diastolic pressure. EF 40%.  Severe pulmonary hypertension.   Recommendations:   Medical therapy per cardiology and pulmonary consultants.   Myoview Impression from 08/2014 Exercise Capacity: There is decreased exercise capacity with extreme shortness of breath early in stress. There was a decrease in O2 saturation with walking as low as 81% BP Response:  Normal blood pressure response. Clinical Symptoms: Severe shortness of breath with walking ECG Impression: No significant ST segment change suggestive of ischemia. Comparison with Prior Nuclear Study: No images to compare  Overall Impression: The study is abnormal. It is a low risk scan. There is no definite scar or ischemia. There is mild left ventricular dysfunction. The patient had limited exercise tolerance. There was significant decrease in O2 saturation and marked dyspnea with exercise.  LV Ejection Fraction: 41%. LV Wall Motion: There is mild global hypokinesis.   Echo Study Conclusions from 08/2014  - Left ventricle: The cavity size was normal. Wall thickness was increased in a pattern of mild LVH. Systolic function was mildly to moderately reduced. The estimated ejection fraction was in the range of 40% to 45%. Diffuse hypokinesis with no identifiable regional variations. Doppler parameters are consistent with abnormal left ventricular relaxation (grade 1 diastolic dysfunction). Indeterminate ventricular filling pressure by Doppler parameters. - Aortic valve: There was trivial regurgitation. - Tricuspid valve: There was mild-moderate regurgitation directed centrally. - Pulmonary arteries: Systolic pressure was moderately increased. PA peak pressure: 61 mm Hg (S).   Assessment/Plan: 1. Dyspnea - most likely  multifactorial but with known pulmonary HTN. Has oxygen in place.   2. Recent cardiac cath with CAD - to manage medically - no active chest pain. BP ok by me on his current regimen. Not on statin - would recommend. Ok to resume his rehab.   3. Pulmonary HTN - followed by pulmonary - seeing them next week.   4. PAF - checking EKG today - this shows NSR with PVCs. Diffuse ST and T wave changes. Unchanged. Remains on low dose beta blocker along with his coumadin.  5. Chronic anticoagulation - no problems noted.   6. Mild LV dysfunction/systolic HF - seems compensated.   Current medicines are reviewed with the patient today.  The patient does not have concerns regarding medicines other than what has been noted above.  The following changes have been made:  See above.  Labs/ tests ordered today include:    Orders Placed This Encounter  Procedures  . Basic metabolic panel  . Hepatic function panel  . Lipid panel  . Hepatic function panel  . Lipid panel  . EKG 12-Lead     Disposition:   FU with Dr. Tamala Julian in 4 months. Dr. Rayann Heman as planned per recall.  Patient is agreeable to this plan and will call if any problems develop in the interim.   Signed: Burtis Junes, RN, ANP-C 05/08/2015 9:03 AM  [place] [address] [place], [place]  [address] Phone: [phone] Fax: [phone]

## 2015-05-09 ENCOUNTER — Other Ambulatory Visit: Payer: Self-pay | Admitting: Internal Medicine

## 2015-05-11 ENCOUNTER — Telehealth: Payer: Self-pay | Admitting: Nurse Practitioner

## 2015-05-11 NOTE — Telephone Encounter (Signed)
Follow Up  Pt called states that he received a call discussing which medications he should or shouldn't take and their instructions. Pt states that he didn't grasp it all and requests a call back to discuss.

## 2015-05-11 NOTE — Telephone Encounter (Signed)
Returned call to patient.He stated He received phone call this past Friday with lab results.Stated he wanted to make sure which medications to avoid.Advised to avoid NSAIDS.Advised may take tylenol.

## 2015-05-12 ENCOUNTER — Encounter (HOSPITAL_COMMUNITY)
Admission: RE | Admit: 2015-05-12 | Discharge: 2015-05-12 | Disposition: A | Discharge status: Home / Self Care | Payer: Medicare Other | Source: Ambulatory Visit | Attending: Internal Medicine | Admitting: Internal Medicine

## 2015-05-12 ENCOUNTER — Telehealth: Payer: Self-pay | Admitting: Internal Medicine

## 2015-05-12 DIAGNOSIS — J432 Centrilobular emphysema: Secondary | ICD-10-CM | POA: Insufficient documentation

## 2015-05-12 NOTE — Progress Notes (Signed)
Today, Patrick Weiss exercised at [employer]. Service time was from 10:30am to 12:15pm.  The patient exercised by performing aerobic, strengthening, and stretching exercises. Oxygen saturation, heart rate, blood pressure, rate of perceived exertion, and shortness of breath were all monitored before, during, and after exercise. Nasser presented with no problems at today's exercise session.  The patient did not have an increase in workload intensity during today's exercise session.  Pre-exercise vitals: . Weight kg: 83.4 . Liters of O2: 2 . SpO2: 98 . HR: 72 . BP: 114/80 . CBG: na  Exercise vitals: . Highest heartrate:  91 . Lowest oxygen saturation: 83 . Highest blood pressure: 126/66 . Liters of 02: 4  Post-exercise vitals: . SpO2: 97 . HR: 76 . BP: 108/64 . Liters of O2: 4 . CBG: na  Dr. Brand Males, Medical Director Dr. Marily Memos is immediately available during today's Pulmonary Rehab session for Patrick Weiss on 05/12/15 at 10:30am class time

## 2015-05-12 NOTE — Telephone Encounter (Signed)
He left a death certificate of his mom Patrick Weiss [birth year] born. Died 0/24/2002 and cause of death as IPF due to Autoimmunity  Dr. Brand Males, M.D., [facility].C.P Pulmonary and Critical Care Medicine Staff Physician [place] [facility] Pager: [phone], If no answer or between  15:00h - 7:00h: call [phone]  05/12/2015 3:56 PM

## 2015-05-13 ENCOUNTER — Ambulatory Visit (INDEPENDENT_AMBULATORY_CARE_PROVIDER_SITE_OTHER): Payer: Medicare Other | Admitting: Internal Medicine

## 2015-05-13 ENCOUNTER — Other Ambulatory Visit: Payer: Self-pay | Admitting: Urology

## 2015-05-13 ENCOUNTER — Encounter: Payer: Self-pay | Admitting: Internal Medicine

## 2015-05-13 ENCOUNTER — Telehealth: Payer: Self-pay | Admitting: Internal Medicine

## 2015-05-13 VITALS — BP 118/80 | HR 78 | Ht 70.0 in | Wt 183.0 lb

## 2015-05-13 DIAGNOSIS — J439 Emphysema, unspecified: Secondary | ICD-10-CM

## 2015-05-13 DIAGNOSIS — J9611 Chronic respiratory failure with hypoxia: Secondary | ICD-10-CM

## 2015-05-13 DIAGNOSIS — N281 Cyst of kidney, acquired: Secondary | ICD-10-CM

## 2015-05-13 DIAGNOSIS — J432 Centrilobular emphysema: Secondary | ICD-10-CM

## 2015-05-13 DIAGNOSIS — I6350 Cerebral infarction due to unspecified occlusion or stenosis of unspecified cerebral artery: Secondary | ICD-10-CM | POA: Diagnosis not present

## 2015-05-13 NOTE — Telephone Encounter (Signed)
Here are the hemodynamics from Dr. Thompson Caul cath.    Hemo Data       Most Recent Value   Fick Cardiac Output  5.62 L/min   Fick Cardiac Output Index  2.78 (L/min)/BSA   RA A Wave  9 mmHg   RA V Wave  8 mmHg   RA Mean  6 mmHg   RV Systolic Pressure  71 mmHg   RV Diastolic Pressure  5 mmHg   RV EDP  7 mmHg   PA Systolic Pressure  71 mmHg   PA Diastolic Pressure  26 mmHg   PA Mean  43 mmHg   PW A Wave  10 mmHg   PW V Wave  9 mmHg   PW Mean  9 mmHg   AO Systolic Pressure  076 mmHg   AO Diastolic Pressure  75 mmHg   AO Mean  98 mmHg   LV Systolic Pressure  808 mmHg   LV Diastolic Pressure  5 mmHg   LV EDP  8 mmHg   Arterial Occlusion Pressure Extended Systolic Pressure  811 mmHg   Arterial Occlusion Pressure Extended Diastolic Pressure  77 mmHg   Arterial Occlusion Pressure Extended Mean Pressure  99 mmHg   Left Ventricular Apex Extended Systolic Pressure  031 mmHg   Left Ventricular Apex Extended Diastolic Pressure  6 mmHg   Left Ventricular Apex Extended EDP Pressure  11 mmHg   QP/QS  1   TPVR Index  15.46 HRUI   TSVR Index  35.23 HRUI   PVR SVR Ratio  0.37   TPVR/TSVR Ratio  0.44

## 2015-05-13 NOTE — Telephone Encounter (Signed)
Hi [name]  What were [name] pulmonary pressures please esp PVR in woods and also PCWP and PAP mean?  THanks  Dr. Brand Males, M.D., [facility].C.P Pulmonary and Critical Care Medicine Staff Physician [place] [facility] Pager: [phone], If no answer or between  15:00h - 7:00h: call [phone]  05/13/2015 2:32 PM

## 2015-05-13 NOTE — Patient Instructions (Signed)
#  chronic respiratory failure hypoxemic ##Emphysema #Idiopathic pulmonary fibrosis  - You have chronic respiratory hypoxemic failure secondary to emphysema and idiopathic pulmonary fibrosis - this is associated with pulmonary hypertension  - Continue pulmonary rehabilitation - Continue want to 1-2L oxygen at rest and 3-4 liters oxygen with exertion   - Always titrate oxygen to keep pulse ox at 88% or above  - Continue Spiriva 1 puff daily (other inhalers are not covered by her insurance) - [place] Flovent 44 g 2 puff 2 times daily - to see if this will help with dyspnea - We discussed IPF treatment and course  - start Esbriet  As discussed = REfer Dr Lake Bells for pulmonary hypertension - first available fine  Folllowup  6 weeks with LFT check  - with Tammy PArrrtt for monitoring

## 2015-05-13 NOTE — Progress Notes (Signed)
Subjective:     Patient ID: Patrick Weiss, male   DOB: 12/11/1940, 74 y.o.   MRN: [id number]  HPI     OV 05/13/2015   Chief Complaint  Patient presents with  . Follow-up    Pt had a cardiac cath on 04/28/2015. Pt states his breathing is unchanged since last OV. Pt denies cough and CP/tightness.    follow-up chronic respiratory failure associated with emphysema and interstitial lung disease high presumption for idiopathic pulmonary fibrosis.  Last visit August 2016. At that time decision was to start anti-fibrotic drug Esbriet. He is not eligible for Ofev due to anticoagulation. Since then he reports stable health. He is attending pulmonary rehabilitation uses several liters of oxygen. No deterioration in health status. Mid-October 2000 and he had cardiac catheterization. 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 This time he is here with his wife. He says that he did not understand all the instructions on the anti-fibrotic drug Esbreit well. . In addition his wife does not understand his pulmonary disease and they want education on these again      Current outpatient prescriptions:  .  allopurinol (ZYLOPRIM) 100 MG tablet, Take 100 mg by mouth daily., Disp: , Rfl:  .  atorvastatin (LIPITOR) 10 MG tablet, Take 1 tablet (10 mg total) by mouth daily., Disp: 90 tablet, Rfl: 3 .  BYSTOLIC 5 MG tablet, TAKE 1 TABLET BY MOUTH DAILY (Patient taking differently: Take 5 mg by mouth daily), Disp: 30 tablet, Rfl: 11 .  Calcium Carbonate-Vitamin D (CALCIUM 600+D) 600-200 MG-UNIT TABS, Take 1 tablet by mouth 2 (two) times daily., Disp: , Rfl:  .  Carboxymethylcellulose Sodium (REFRESH TEARS OP), Apply to eye., Disp: , Rfl:  .   fexofenadine (ALLEGRA) 180 MG tablet, Take 180 mg by mouth daily as needed for allergies. , Disp: , Rfl:  .  fluticasone (FLOVENT HFA) 44 MCG/ACT inhaler, Inhale 2 puffs into the lungs 2 (two) times daily., Disp: 1 Inhaler, Rfl: 12 .  NON FORMULARY, Place 3 L into the nose continuous., Disp: , Rfl:  .  Spacer/Aero-Holding Chambers (AEROCHAMBER MV) inhaler, Use as instructed, Disp: 1 each, Rfl: 0 .  SPIRIVA RESPIMAT 2.5 MCG/ACT AERS, Inhale 2 puffs into the lungs daily. , Disp: , Rfl: 5 .  valsartan-hydrochlorothiazide (DIOVAN-HCT) 320-12.5 MG per tablet, Take 1 tablet by mouth daily., Disp: , Rfl:  .  warfarin (COUMADIN) 5 MG tablet, TAKE AS DIRECTED, Disp: 120 tablet, Rfl: 1 .  Naproxen Sodium (ALEVE) 220 MG CAPS, Take 440 mg by mouth daily as needed (pain). , Disp: , Rfl:     Review of Systems Per hpi    Objective:   Physical Exam  Filed Vitals:   05/13/15 1413  BP: 118/80  Pulse: 78  Height: 5' 10" (1.778 m)  Weight: 183 lb (83.008 kg)  SpO2: 90%   Focused exam shows stable male. Looks well. Wearing sunglasses. Crackles in the base of the chest. Using oxygen. Alert and oriented 3.     Assessment:       ICD-9-CM ICD-10-CM   1. Chronic respiratory failure with hypoxia (HCC) 518.83 J96.11    799.02    2. Pulmonary emphysema, unspecified emphysema type ([place]) 492.8 J43.9   3. 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  - You have chronic respiratory hypoxemic failure secondary to emphysema and idiopathic pulmonary fibrosis - this is associated with pulmonary hypertension  - Continue pulmonary rehabilitation - Continue want to 1-2L oxygen at rest and 3-4 liters oxygen with exertion   - Always titrate oxygen to keep pulse ox at 88% or above  - Continue Spiriva 1 puff daily (other inhalers are not covered by her insurance) - [place] Flovent 44 g 2 puff 2 times daily -  to see if this will help with dyspnea - We discussed IPF treatment and course  - start Esbriet  As discussed = REfer Dr Lake Bells for pulmonary hypertension - first available fine  - I have written to [place] [facility] asking for details of the right heart catheterization  Folllowup  6 weeks with LFT check  - with Tammy PArrrtt for monitoring   > 50% of this > 25 min visit spent in face to face counseling or coordination of care   Dr. Brand Males, M.D., [facility].C.P Pulmonary and Critical Care Medicine Staff Physician [place] [facility] Pager: [phone], If no answer or between  15:00h - 7:00h: call [phone]  05/13/2015 2:35 PM

## 2015-05-14 ENCOUNTER — Encounter (HOSPITAL_COMMUNITY)
Admission: RE | Admit: 2015-05-14 | Discharge: 2015-05-14 | Disposition: A | Discharge status: Home / Self Care | Payer: Medicare Other | Source: Ambulatory Visit | Attending: Internal Medicine | Admitting: Internal Medicine

## 2015-05-14 DIAGNOSIS — J432 Centrilobular emphysema: Secondary | ICD-10-CM | POA: Diagnosis not present

## 2015-05-14 NOTE — Progress Notes (Signed)
Today, Izic exercised at [employer]. Service time was from 10:30am to 12:30pm.  The patient exercised by performing aerobic, strengthening, and stretching exercises. Oxygen saturation, heart rate, blood pressure, rate of perceived exertion, and shortness of breath were all monitored before, during, and after exercise. Browning presented with no problems at today's exercise session. The patient attended education today on [employer] with Derek M[place].  The patient did not have an increase in workload intensity during today's exercise session.  Pre-exercise vitals: . Weight kg: 83.9 . Liters of O2: 2 . SpO2: 95 . HR: 71 . BP: 104/60 . CBG: na  Exercise vitals: . Highest heartrate:  82 . Lowest oxygen saturation: 89 . Highest blood pressure: 114/60 . Liters of 02: 4  Post-exercise vitals: . SpO2: 98 . HR: 56 . BP: 102/60 . Liters of O2: 4 . CBG: na  Dr. Brand Males, Medical Director Dr. Marily Memos is immediately available during today's Pulmonary Rehab session for [name] on 05/14/15 at 10:30am class time

## 2015-05-15 ENCOUNTER — Telehealth: Payer: Self-pay | Admitting: Internal Medicine

## 2015-05-15 NOTE — Telephone Encounter (Signed)
FYI to Shindler

## 2015-05-15 NOTE — Telephone Encounter (Signed)
Spoke with pt. He was approved for esbriet by [employer] for grant of $9000. Pt was not told where/how to order the esbriet. Daneil Dan, do you still have paperwork to call the specialty pharmacy? I don't see anything scanned into chart. thanks

## 2015-05-18 ENCOUNTER — Ambulatory Visit (INDEPENDENT_AMBULATORY_CARE_PROVIDER_SITE_OTHER): Payer: Medicare Other | Admitting: Pharmacist

## 2015-05-18 DIAGNOSIS — I4891 Unspecified atrial fibrillation: Secondary | ICD-10-CM

## 2015-05-18 DIAGNOSIS — I6350 Cerebral infarction due to unspecified occlusion or stenosis of unspecified cerebral artery: Secondary | ICD-10-CM

## 2015-05-18 DIAGNOSIS — Z7901 Long term (current) use of anticoagulants: Secondary | ICD-10-CM

## 2015-05-18 LAB — POCT INR: INR: 3

## 2015-05-18 NOTE — Telephone Encounter (Signed)
Pt calling back checking on the status of getting his medication ordered. Patrick Dan, do you still have forms? thanks

## 2015-05-18 NOTE — Telephone Encounter (Signed)
[phone], pt cb °

## 2015-05-18 NOTE — Telephone Encounter (Addendum)
I requested the Esbriet be ran through [employer] but [facility] is the pharmacy that is mandated by the [employer] plan. I just received a PA for Esbriet on 05/18/2015. Will call to initiate PA and inform pt on 05/19/2015.

## 2015-05-19 ENCOUNTER — Encounter (HOSPITAL_COMMUNITY)
Admission: RE | Admit: 2015-05-19 | Discharge: 2015-05-19 | Disposition: A | Discharge status: Home / Self Care | Payer: Medicare Other | Source: Ambulatory Visit | Attending: Internal Medicine | Admitting: Internal Medicine

## 2015-05-19 DIAGNOSIS — J432 Centrilobular emphysema: Secondary | ICD-10-CM | POA: Diagnosis not present

## 2015-05-19 NOTE — Progress Notes (Signed)
Today, Clary exercised at [employer]. Service time was from 1030 to 1205.  The patient exercised by performing aerobic, strengthening, and stretching exercises. Oxygen saturation, heart rate, blood pressure, rate of perceived exertion, and shortness of breath were all monitored before, during, and after exercise. Junious presented with no problems at today's exercise session.  The patient did not have an increase in workload intensity during today's exercise session.  Pre-exercise vitals: . Weight kg: 84.2 . Liters of O2: 2L . SpO2: 95 . HR: 70 . BP: 106/60 . CBG: na  Exercise vitals: . Highest heartrate:  94 . Lowest oxygen saturation: 86 . Highest blood pressure: 112/84 . Liters of 02: 4L  Post-exercise vitals: . SpO2: 98 . HR: 77 . BP: 100/60 . Liters of O2: 4L . CBG: na  Dr. Brand Males, Medical Director Dr. Ree Kida is immediately available during today's Pulmonary Rehab session for Patrick Weiss on 05/19/2015 at 1030 class time.

## 2015-05-19 NOTE — Telephone Encounter (Signed)
Called [employer] at [phone]. Spoke with Reagie and initiated PA. PA for Jacklynn Bue has been approved from 03/20/15-11/15/15. Called [facility] at [phone] and spoke with pharmacy tech, informed her of the approval of [place]. LMTCB for pt to inform him Jacklynn Bue has been approved and to call [facility] at [phone] to begin shipment of medication.

## 2015-05-19 NOTE — Telephone Encounter (Signed)
Patient Returned call [phone]

## 2015-05-19 NOTE — Telephone Encounter (Signed)
Spoke with pt, aware of pharmacy.  Gave the below # for him to call.  Nothing further needed.

## 2015-05-21 ENCOUNTER — Encounter (HOSPITAL_COMMUNITY)
Admission: RE | Admit: 2015-05-21 | Discharge: 2015-05-21 | Disposition: A | Discharge status: Home / Self Care | Payer: Medicare Other | Source: Ambulatory Visit | Attending: Internal Medicine | Admitting: Internal Medicine

## 2015-05-21 DIAGNOSIS — J432 Centrilobular emphysema: Secondary | ICD-10-CM | POA: Diagnosis not present

## 2015-05-21 NOTE — Progress Notes (Signed)
Today, Patrick Weiss exercised at [employer]. Service time was from 10:30am to 12:05pm.  The patient exercised by performing aerobic, strengthening, and stretching exercises. Oxygen saturation, heart rate, blood pressure, rate of perceived exertion, and shortness of breath were all monitored before, during, and after exercise. Patrick Weiss presented with no problems at today's exercise session.Patient attended education today on COPD.   The patient did not have an increase in workload intensity during today's exercise session.  Pre-exercise vitals: . Weight kg: 84.6 . Liters of O2: 2 . SpO2: 97 . HR: 60 . BP: 120/80 . CBG: na  Exercise vitals: . Highest heartrate:  106 . Lowest oxygen saturation: 89 . Highest blood pressure: 108/80 . Liters of 02: 4  Post-exercise vitals: . SpO2: 98 . HR: 60 . BP: 104/64 . Liters of O2: 4 . CBG: na  Dr. Brand Males, Medical Director Dr. Marily Memos is immediately available during today's Pulmonary Rehab session for Patrick Weiss on 05/21/15 at 10:30am class time

## 2015-05-26 ENCOUNTER — Encounter (HOSPITAL_COMMUNITY)
Admission: RE | Admit: 2015-05-26 | Discharge: 2015-05-26 | Disposition: A | Discharge status: Home / Self Care | Payer: Medicare Other | Source: Ambulatory Visit | Attending: Internal Medicine | Admitting: Internal Medicine

## 2015-05-26 ENCOUNTER — Telehealth: Payer: Self-pay | Admitting: Internal Medicine

## 2015-05-26 DIAGNOSIS — J432 Centrilobular emphysema: Secondary | ICD-10-CM | POA: Diagnosis not present

## 2015-05-26 NOTE — Telephone Encounter (Signed)
Left message for pt to call back

## 2015-05-26 NOTE — Telephone Encounter (Signed)
Spoke with pt. He was just letting us know what was going with his medication and concentrator. He does not need anything at this time. Advised him if we can help him in anyway with these matters, we are happy to help. Nothing further was needed at this time.

## 2015-05-26 NOTE — Progress Notes (Signed)
Today, Charvis exercised at [employer]. Service time was from 1030 to 1200.  The patient exercised by performing aerobic, strengthening, and stretching exercises. Oxygen saturation, heart rate, blood pressure, rate of perceived exertion, and shortness of breath were all monitored before, during, and after exercise. Judge presented with no problems at today's exercise session.  The patient did no have an increase in workload intensity during today's exercise session.  Pre-exercise vitals: . Weight kg: 84.9 . Liters of O2: 2L . SpO2: 98 . HR: 64 . BP: 120/70 . CBG: na  Exercise vitals: . Highest heartrate:  89 . Lowest oxygen saturation: 88 . Highest blood pressure: 110/60 . Liters of 02: 4L  Post-exercise vitals: . SpO2: 97 . HR: 66 . BP: 102/68 . Liters of O2: 4L . CBG: na  Dr. Brand Males, Medical Director Dr. Marily Memos is immediately available during today's Pulmonary Rehab session for SEGUNDO MAKELA on 05/26/2015 at 1030 class time

## 2015-05-26 NOTE — Telephone Encounter (Signed)
A audit by medicare    [id number]

## 2015-05-26 NOTE — Telephone Encounter (Signed)
Patient returned call, can be reached at [phone].

## 2015-05-28 ENCOUNTER — Telehealth: Payer: Self-pay | Admitting: Internal Medicine

## 2015-05-28 ENCOUNTER — Encounter (HOSPITAL_COMMUNITY): Admission: RE | Admit: 2015-05-28 | Payer: Medicare Other | Source: Ambulatory Visit

## 2015-05-28 NOTE — Telephone Encounter (Signed)
lmomtcb x1 

## 2015-05-28 NOTE — Telephone Encounter (Signed)
Patient returned call. He stated that he did receive confirmation with [facility] that they did receive the rx for esbriet. The [facility] rep informed the patient that the med was in route to his house. I asked the patient if he had any other concerns or questions and he stated no. I informed him if he had any more difficulty to contact our office. Patient voiced understanding and had no further questions. Nothing further needed.

## 2015-06-02 ENCOUNTER — Telehealth: Payer: Self-pay | Admitting: Interventional Cardiology

## 2015-06-02 ENCOUNTER — Telehealth: Payer: Self-pay | Admitting: Internal Medicine

## 2015-06-02 ENCOUNTER — Encounter (HOSPITAL_COMMUNITY)
Admission: RE | Admit: 2015-06-02 | Discharge: 2015-06-02 | Disposition: A | Discharge status: Home / Self Care | Payer: Medicare Other | Source: Ambulatory Visit | Attending: Internal Medicine | Admitting: Internal Medicine

## 2015-06-02 DIAGNOSIS — R600 Localized edema: Secondary | ICD-10-CM

## 2015-06-02 MED ORDER — FUROSEMIDE 20 MG PO TABS: ORAL_TABLET | ORAL | Status: DC

## 2015-06-02 NOTE — Telephone Encounter (Signed)
Spoke with Truddie Crumble at [facility].  Pt at rehab today with weight gain as noted below. Swelling in both legs from knees down. Left significantly greater than right.  This is new for pt.  Negative Homan's sign on left.  Oxygen sat 93% on 4 liters.  Pt usually is 97%-98% on 2 liters oxygen.  Lungs sound clear and pt is voicing no complaints. Legs are not painful.  Started Esbriet for pulmonary fibrosis 3 days ago.  Will review with provider in office.

## 2015-06-02 NOTE — Progress Notes (Signed)
Mr. Common presents to pulmonary rehab complaining of new onset lower extremity edema, L>R, knees downward. Vitals as follows: HR 85, BP 124/79, SaO2 93% on 4L. Patient states the swelling began approximately 3 days ago and is getting worse. It is noted that his weight is also 87.8kg, up from 84.9kg on 05/26/15. Patient denies increased SOB, but O2 saturations lower on higher litter flow than normal (see previous pulm rehab exercise session progress notes). Lungs clear in the upper lobes, fine crackles in the bases bilat. Patient also confirms starting new medication for IPF 3 days ago. Patient denies pain in legs, Homans sign negative. Spoke with Jeannene Patella, RN at [facility]. New orders relayed from Tera Helper. Patient instructed to pick up new lasix prescription and to take 52m daily x 3 days then decrease to 272mdaily, to weigh himself daily and notify MD if gains 3lbs overnight or 5 lbs in 1 week, to decrease NA intake (handouts given), and to return to lab on Monday 11/28 for bloodwork. Patient verbalized understanding. Patient also instructed to notify MD if symptoms worsen. Discharged from pulmonary rehab without exercising, in stable condition.

## 2015-06-02 NOTE — Telephone Encounter (Signed)
Reviewed with Truitt Merle, NP and pt should start Lasix.  Dose is Lasix 40 mg daily for 3 days and then decease to 20 mg daily.  Needs BMP and BNP on 06/08/15.  Need to decrease salt intake.  Weigh daily.  I spoke with Portia at Cardiac Rehab and she will give instructions to pt.  Will send prescription to [facility] on [place].

## 2015-06-02 NOTE — Telephone Encounter (Signed)
New message    patient at cardiac rehab now  C/O weigh increase since 11/15 - 84.9  Today  87.8 . New lower extremities edema x 2 . Left greater more than right .   Seen NPA on  10/28  .   Will keep patient at rehab until office called back.

## 2015-06-02 NOTE — Telephone Encounter (Signed)
I am not sure if this is due to esbriet. Never seen it with this med so far but it could be. ALternatively could be due to pulm htn. We have t okeep an eye foir now. Have him take lasix 62m daily x 7 days - call next week to see how it is going      Current outpatient prescriptions:  .  allopurinol (ZYLOPRIM) 100 MG tablet, Take 100 mg by mouth daily., Disp: , Rfl:  .  atorvastatin (LIPITOR) 10 MG tablet, Take 1 tablet (10 mg total) by mouth daily., Disp: 90 tablet, Rfl: 3 .  BYSTOLIC 5 MG tablet, TAKE 1 TABLET BY MOUTH DAILY (Patient taking differently: Take 5 mg by mouth daily), Disp: 30 tablet, Rfl: 11 .  Calcium Carbonate-Vitamin D (CALCIUM 600+D) 600-200 MG-UNIT TABS, Take 1 tablet by mouth 2 (two) times daily., Disp: , Rfl:  .  Carboxymethylcellulose Sodium (REFRESH TEARS OP), Apply to eye., Disp: , Rfl:  .  fexofenadine (ALLEGRA) 180 MG tablet, Take 180 mg by mouth daily as needed for allergies. , Disp: , Rfl:  .  fluticasone (FLOVENT HFA) 44 MCG/ACT inhaler, Inhale 2 puffs into the lungs 2 (two) times daily., Disp: 1 Inhaler, Rfl: 12 .  furosemide (LASIX) 20 MG tablet, Take 2 tablets daily for 3 days and then decrease to one tablet daily, Disp: 35 tablet, Rfl: 3 .  Naproxen Sodium (ALEVE) 220 MG CAPS, Take 440 mg by mouth daily as needed (pain). , Disp: , Rfl:  .  NON FORMULARY, Place 3 L into the nose continuous., Disp: , Rfl:  .  Spacer/Aero-Holding Chambers (AEROCHAMBER MV) inhaler, Use as instructed, Disp: 1 each, Rfl: 0 .  SPIRIVA RESPIMAT 2.5 MCG/ACT AERS, Inhale 2 puffs into the lungs daily. , Disp: , Rfl: 5 .  valsartan-hydrochlorothiazide (DIOVAN-HCT) 320-12.5 MG per tablet, Take 1 tablet by mouth daily., Disp: , Rfl:  .  warfarin (COUMADIN) 5 MG tablet, TAKE AS DIRECTED, Disp: 120 tablet, Rfl: 1

## 2015-06-02 NOTE — Telephone Encounter (Signed)
Spoke with pt - aware of rec's per Dr Chase Caller. Pt states that he saw Dr Servando Snare today and was prescribed Lasix 30m Sig: Take 2 tablets daily for 3 days and then decrease to one tablet daily Per MR, take this as directed and call on Monday with update.  Pt also aware that if symptoms worsen between now and Friday that he can contact our office for further rec's.  Pt also aware to continue taking Esbriet with the lasix.  Nothing further needed.

## 2015-06-02 NOTE — Telephone Encounter (Signed)
Spoke with pt. States that he started taking Esbriet on Sunday. His ankles have started swelling since starting this medication. Last week he weight 185lbs, today he weighs 197lbs. He is taking 3 tablets TID. Truitt Merle, NP gave him Lasix 40m daily for the next three days to take for the swelling.  MR - please advise. Thanks.

## 2015-06-04 ENCOUNTER — Encounter (HOSPITAL_COMMUNITY): Payer: Medicare Other

## 2015-06-08 ENCOUNTER — Other Ambulatory Visit (INDEPENDENT_AMBULATORY_CARE_PROVIDER_SITE_OTHER): Payer: Medicare Other | Admitting: *Deleted

## 2015-06-08 ENCOUNTER — Other Ambulatory Visit: Payer: Medicare Other

## 2015-06-08 DIAGNOSIS — R600 Localized edema: Secondary | ICD-10-CM

## 2015-06-08 LAB — BASIC METABOLIC PANEL
BUN: 24 mg/dL (ref 7–25)
CO2: 30 mmol/L (ref 20–31)
Calcium: 9.6 mg/dL (ref 8.6–10.3)
Chloride: 102 mmol/L (ref 98–110)
Creat: 1.49 mg/dL — ABNORMAL HIGH (ref 0.70–1.18)
Glucose, Bld: 89 mg/dL (ref 65–99)
Potassium: 3.8 mmol/L (ref 3.5–5.3)
Sodium: 141 mmol/L (ref 135–146)

## 2015-06-09 ENCOUNTER — Encounter (HOSPITAL_COMMUNITY)
Admission: RE | Admit: 2015-06-09 | Discharge: 2015-06-09 | Disposition: A | Discharge status: Home / Self Care | Payer: Medicare Other | Source: Ambulatory Visit | Attending: Internal Medicine | Admitting: Internal Medicine

## 2015-06-09 DIAGNOSIS — J432 Centrilobular emphysema: Secondary | ICD-10-CM | POA: Diagnosis not present

## 2015-06-09 LAB — BRAIN NATRIURETIC PEPTIDE: Brain Natriuretic Peptide: 1077.4 pg/mL — ABNORMAL HIGH (ref 0.0–100.0)

## 2015-06-09 NOTE — Progress Notes (Signed)
Patrick Weiss completed a Six-Minute Walk Test on 06/09/15 . [place] walked 972 feet with 1 break lasting 1 minute and 30 seconds.  The patient's lowest oxygen saturation was 80 %, highest heart rate was 125 bpm , and highest blood pressure was 128/84. The patient was on 4 liters of oxygen with a nasal cannula. Patient stated that nothing hindered their walk test.

## 2015-06-11 ENCOUNTER — Telehealth: Payer: Self-pay | Admitting: Nurse Practitioner

## 2015-06-11 DIAGNOSIS — R600 Localized edema: Secondary | ICD-10-CM

## 2015-06-11 NOTE — Telephone Encounter (Signed)
New message      Pt is returning a call from the nurse

## 2015-06-11 NOTE — Telephone Encounter (Signed)
Informed pt that Patrick Weiss would like to get labs repeated in 2-3 weeks. Scheduled lab appt for 12/15. Pt verbalized understanding and was in agreement with this plan.

## 2015-06-11 NOTE — Addendum Note (Signed)
Addended by: Loren Racer on: 06/11/2015 09:30 AM   Modules accepted: Orders

## 2015-06-13 ENCOUNTER — Other Ambulatory Visit: Payer: Self-pay | Admitting: Internal Medicine

## 2015-06-15 ENCOUNTER — Ambulatory Visit (INDEPENDENT_AMBULATORY_CARE_PROVIDER_SITE_OTHER): Payer: Medicare Other | Admitting: *Deleted

## 2015-06-15 ENCOUNTER — Other Ambulatory Visit (INDEPENDENT_AMBULATORY_CARE_PROVIDER_SITE_OTHER): Payer: Medicare Other | Admitting: *Deleted

## 2015-06-15 ENCOUNTER — Other Ambulatory Visit: Payer: Self-pay | Admitting: Internal Medicine

## 2015-06-15 DIAGNOSIS — I6350 Cerebral infarction due to unspecified occlusion or stenosis of unspecified cerebral artery: Secondary | ICD-10-CM | POA: Diagnosis not present

## 2015-06-15 DIAGNOSIS — Z7901 Long term (current) use of anticoagulants: Secondary | ICD-10-CM | POA: Diagnosis not present

## 2015-06-15 DIAGNOSIS — I4891 Unspecified atrial fibrillation: Secondary | ICD-10-CM

## 2015-06-15 DIAGNOSIS — N189 Chronic kidney disease, unspecified: Secondary | ICD-10-CM

## 2015-06-15 DIAGNOSIS — E785 Hyperlipidemia, unspecified: Secondary | ICD-10-CM

## 2015-06-15 DIAGNOSIS — R0600 Dyspnea, unspecified: Secondary | ICD-10-CM

## 2015-06-15 LAB — LIPID PANEL
Cholesterol: 114 mg/dL — ABNORMAL LOW (ref 125–200)
HDL: 41 mg/dL (ref >=40)
LDL Cholesterol: 61 mg/dL (ref <130)
Total CHOL/HDL Ratio: 2.8 Ratio (ref <=5.0)
Triglycerides: 61 mg/dL (ref <150)
VLDL: 12 mg/dL (ref <30)

## 2015-06-15 LAB — BASIC METABOLIC PANEL
BUN: 21 mg/dL (ref 7–25)
CO2: 24 mmol/L (ref 20–31)
Calcium: 9.5 mg/dL (ref 8.6–10.3)
Chloride: 107 mmol/L (ref 98–110)
Creat: 1.43 mg/dL — ABNORMAL HIGH (ref 0.70–1.18)
Glucose, Bld: 86 mg/dL (ref 65–99)
Potassium: 3.8 mmol/L (ref 3.5–5.3)
Sodium: 144 mmol/L (ref 135–146)

## 2015-06-15 LAB — HEPATIC FUNCTION PANEL
ALT: 27 U/L (ref 9–46)
AST: 25 U/L (ref 10–35)
Albumin: 3.7 g/dL (ref 3.6–5.1)
Alkaline Phosphatase: 48 U/L (ref 40–115)
Bilirubin, Direct: 0.2 mg/dL (ref <=0.2)
Indirect Bilirubin: 0.6 mg/dL (ref 0.2–1.2)
Total Bilirubin: 0.8 mg/dL (ref 0.2–1.2)
Total Protein: 6.2 g/dL (ref 6.1–8.1)

## 2015-06-15 LAB — POCT INR: INR: 1.8

## 2015-06-22 NOTE — Progress Notes (Signed)
Pulmonary Rehab Discharge Note: Carnel has been discharged from pulmonary rehab after successfully completing 28 exercise and education sessions. Giavonni did require a bump up in his oxygen from 3 liters to 4 while in the program however this enabled him to tolerate increases in his workloads on the equipment. Tydarius increased his stamina and strength while in the program as evidenced by his ability to walk an additional 60 feet on his discharge walk test as compared to his admission. Ory learned how to purse lip breath while exerting himself which made a big improvement in his shortness of breath. He had hoped to regain his ability to play tennis again but he feels he is still to short of breath to do so. Vandy plans to continue to exercise at home by walking daily. It was a pleasure to have Berneta Sages in pulmonary rehab.

## 2015-06-24 ENCOUNTER — Encounter: Payer: Self-pay | Admitting: Adult Health

## 2015-06-24 ENCOUNTER — Ambulatory Visit (INDEPENDENT_AMBULATORY_CARE_PROVIDER_SITE_OTHER): Payer: Medicare Other | Admitting: Adult Health

## 2015-06-24 VITALS — BP 110/76 | HR 76 | Temp 97.6°F | Ht 71.0 in | Wt 192.0 lb

## 2015-06-24 DIAGNOSIS — J84112 Idiopathic pulmonary fibrosis: Secondary | ICD-10-CM | POA: Diagnosis not present

## 2015-06-24 DIAGNOSIS — I6350 Cerebral infarction due to unspecified occlusion or stenosis of unspecified cerebral artery: Secondary | ICD-10-CM

## 2015-06-24 DIAGNOSIS — J9611 Chronic respiratory failure with hypoxia: Secondary | ICD-10-CM | POA: Diagnosis not present

## 2015-06-24 DIAGNOSIS — J439 Emphysema, unspecified: Secondary | ICD-10-CM

## 2015-06-24 DIAGNOSIS — I5022 Chronic systolic (congestive) heart failure: Secondary | ICD-10-CM

## 2015-06-24 DIAGNOSIS — I5032 Chronic diastolic (congestive) heart failure: Secondary | ICD-10-CM | POA: Insufficient documentation

## 2015-06-24 NOTE — Assessment & Plan Note (Signed)
Stable cont on Spriiva

## 2015-06-24 NOTE — Assessment & Plan Note (Signed)
Compensated on O2  Use continuous flow when walking

## 2015-06-24 NOTE — Patient Instructions (Addendum)
Continue on Esbriet 3 .caps Three times a day  , take with food.  Continue on Spiriva Respimat 2 puff daily  Continue on Oxygen 3l/m  And 4l/m with walking . (use continuous flow when walking) .  Follow up Dr. Chase Caller  With Dr. Chase Caller in 2 months and As needed

## 2015-06-24 NOTE — Progress Notes (Signed)
Subjective:    Patient ID: Patrick Weiss, male    DOB: [birth year], 74 y.o.   MRN: [id number]  HPI 74 yo male with IPF, Emphysema and Chronic Resp Failure on o2    TEST :  Lung cut and virtual colonoscopy 02/18/2014: Suggestion of UIP pulmonary fibrosis findings  Pulmonary function test 09/09/2014 prebronchodilator FVC of 3.08 L/81%, FEV1 of 1.99 L/70% a ratio of 65. Spirometry suggests restriction. Postbronchodilator FEV1 of 2.14 L/75% with a mild nonsignificant bronchi dilator response. Total lung capacity of 5.7 L/80% and normal. Diffusion capacity is significantly reduced at 9.5 L/29%  Cardiac echo 51/08/5850: Chronic systolic heart failure with ejection fraction of 40% CT chest on April 14 showed a combination of severe emphysema as well as fibrotic changes in the lungs they have progressed with presence of honeycombing and concerning for progressively worsening usual interstitial pneumonia  04/28/15  Right/Left Heart Cath and Coronary Angiography    Conclusion    1. Mid RCA to Dist RCA lesion, 65% stenosed. 2. 1st RPLB lesion, 70% stenosed. 3. Ost Cx to Prox Cx lesion, 50% stenosed. 4. Mid Cx to Dist Cx lesion, 65% stenosed. 5. Prox LAD to Dist LAD lesion, 45% stenosed. 6. 1st Diag lesion, 85% stenosed.   Chronic stable coronary artery disease with lesions as noted above. There is moderate to moderately severe involvement in all 3 territories.  Chronic systolic heart failure with normal left ventricular end-diastolic pressure. EF 40%.  Severe pulmonary hypertension.        06/24/2015 Follow up : IPF and Emphysema  Pt returns for 6 week follow up .  Started on Esbriet 3 caps three times a day on 05/31/15.  Says he is doing okay except for fatigue Denies nausea/vomitting. Does feel appetite is lower. Discussed taking with meals.  No significant change in chronic SOB, chest tightness at times, and dry cough. Ankle swelling started on 05/31/15, started on lasix by   Cardiology . (BNP 1077) . Slight decrease in swelling. But not totally gone.  Has follow up with Cardiology tomorrow.  Denies any chest congestion, sinus pressure/drainage, fever, nausea or vomiting.  LFT done last week was nml.  Denies chest pain, orthopnea, fever or hemoptysis.      Past Medical History  Diagnosis Date  . A-fib (HCC)     paroxysmal  . Hypertension   . 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 allopurinol (ZYLOPRIM) 100 MG tablet Take 100 mg by mouth daily.    Marland Kitchen atorvastatin (LIPITOR) 10 MG tablet Take 1 tablet (10 mg total) by mouth daily. 90 tablet 3  . BYSTOLIC 5 MG tablet TAKE 1 TABLET BY MOUTH DAILY (Patient taking differently: Take 5 mg by mouth daily) 30 tablet 11  . Calcium Carbonate-Vitamin D (CALCIUM 600+D) 600-200 MG-UNIT TABS Take 1 tablet by mouth 2 (two) times daily.    . Carboxymethylcellulose Sodium (REFRESH TEARS OP) Apply to eye.    . fexofenadine (ALLEGRA) 180 MG tablet Take 180 mg by mouth daily as needed for allergies.     . fluticasone (FLOVENT HFA) 44 MCG/ACT inhaler Inhale 2 puffs into the lungs 2 (two) times daily. 1 Inhaler 12  . furosemide (LASIX) 20 MG tablet Take 2 tablets daily for 3 days and then decrease to one tablet daily 35 tablet 3  .  Naproxen Sodium (ALEVE) 220 MG CAPS Take 440 mg by mouth daily as needed (pain).     . NON FORMULARY Place 3 L into the nose continuous.    . Pirfenidone (ESBRIET) 267 MG CAPS Take 3 capsules TID 270 capsule 5  . Spacer/Aero-Holding Chambers (AEROCHAMBER MV) inhaler Use as instructed 1 each 0  . SPIRIVA RESPIMAT 2.5 MCG/ACT AERS Inhale 2 puffs into the lungs daily.   5  . SPIRIVA RESPIMAT 2.5 MCG/ACT AERS INHALE 2 PUFFS INTO THE LUNGS DAILY 4 g 5  . valsartan-hydrochlorothiazide (DIOVAN-HCT) 320-12.5 MG per tablet Take 1 tablet by mouth daily.      Marland Kitchen warfarin (COUMADIN) 5 MG tablet TAKE AS DIRECTED 120 tablet 1   No current facility-administered medications on file prior to visit.     Review of Systems Constitutional:   No  weight loss, night sweats,  Fevers, chills, + fatigue, or  lassitude.  HEENT:   No headaches,  Difficulty swallowing,  Tooth/dental problems, or  Sore throat,                No sneezing, itching, ear ache, nasal congestion, post nasal drip,   CV:  No chest pain,  Orthopnea, PND, swelling in lower extremities, anasarca, dizziness, palpitations, syncope.   GI  No heartburn, indigestion, abdominal pain, nausea, vomiting, diarrhea, change in bowel habits, loss of appetite, bloody stools.   Resp:   No chest wall deformity  Skin: no rash or lesions.  GU: no dysuria, change in color of urine, no urgency or frequency.  No flank pain, no hematuria   MS:  No joint pain or swelling.  No decreased range of motion.  No back pain.  Psych:  No change in mood or affect. No depression or anxiety.  No memory loss.         Objective:   Physical Exam GEN: A/Ox3; pleasant , NAD, elderly on O2   HEENT:  [place]/AT,  EACs-clear, TMs-wnl, NOSE-clear, THROAT-clear, no lesions, no postnasal drip or exudate noted.   NECK:  Supple w/ fair ROM; no JVD; normal carotid impulses w/o bruits; no thyromegaly or nodules palpated; no lymphadenopathy.  RESP  BB crackles , no accessory muscle use, no dullness to percussion  CARD:  RRR, 1/6 SM   , 1-2+ peripheral edema, pulses intact, no cyanosis or clubbing.  GI:   Soft & nt; nml bowel sounds; no organomegaly or masses detected.  Musco: Warm bil, no deformities or joint swelling noted.   Neuro: alert, no focal deficits noted.    Skin: Warm, no lesions or rashes         Assessment & Plan:

## 2015-06-24 NOTE — Assessment & Plan Note (Signed)
IPF =stable on Esbriet  Take with food.  Check LFT on return  follow up Dr. Chase Caller in 6-8 weeks and As needed

## 2015-06-24 NOTE — Assessment & Plan Note (Signed)
Recent decompensation with leg swelling  Cont on lasix  follow up with card tomorrow as planned

## 2015-06-25 ENCOUNTER — Ambulatory Visit (HOSPITAL_COMMUNITY)
Admission: RE | Admit: 2015-06-25 | Discharge: 2015-06-25 | Disposition: A | Discharge status: Home / Self Care | Payer: Medicare Other | Source: Ambulatory Visit | Attending: Urology | Admitting: Urology

## 2015-06-25 ENCOUNTER — Other Ambulatory Visit (INDEPENDENT_AMBULATORY_CARE_PROVIDER_SITE_OTHER): Payer: Medicare Other | Admitting: *Deleted

## 2015-06-25 DIAGNOSIS — Q6102 Congenital multiple renal cysts: Secondary | ICD-10-CM | POA: Diagnosis not present

## 2015-06-25 DIAGNOSIS — R600 Localized edema: Secondary | ICD-10-CM

## 2015-06-25 DIAGNOSIS — N281 Cyst of kidney, acquired: Secondary | ICD-10-CM

## 2015-06-25 LAB — BASIC METABOLIC PANEL
BUN: 19 mg/dL (ref 7–25)
CO2: 27 mmol/L (ref 20–31)
Calcium: 9.2 mg/dL (ref 8.6–10.3)
Chloride: 109 mmol/L (ref 98–110)
Creat: 1.49 mg/dL — ABNORMAL HIGH (ref 0.70–1.18)
Glucose, Bld: 85 mg/dL (ref 65–99)
Potassium: 4 mmol/L (ref 3.5–5.3)
Sodium: 143 mmol/L (ref 135–146)

## 2015-06-25 LAB — BRAIN NATRIURETIC PEPTIDE: Brain Natriuretic Peptide: 861.1 pg/mL — ABNORMAL HIGH (ref 0.0–100.0)

## 2015-06-25 MED ORDER — GADOBENATE DIMEGLUMINE 529 MG/ML IV SOLN
20.0000 mL | Freq: Once | INTRAVENOUS | Status: AC | PRN
  Administered 2015-06-25: 18 mL via INTRAVENOUS

## 2015-06-25 NOTE — Addendum Note (Signed)
Addended by: Eulis Foster on: 06/25/2015 08:40 AM   Modules accepted: Orders

## 2015-06-29 ENCOUNTER — Telehealth: Payer: Self-pay | Admitting: Interventional Cardiology

## 2015-06-29 ENCOUNTER — Ambulatory Visit (INDEPENDENT_AMBULATORY_CARE_PROVIDER_SITE_OTHER): Payer: Medicare Other

## 2015-06-29 DIAGNOSIS — R0600 Dyspnea, unspecified: Secondary | ICD-10-CM

## 2015-06-29 DIAGNOSIS — I6350 Cerebral infarction due to unspecified occlusion or stenosis of unspecified cerebral artery: Secondary | ICD-10-CM | POA: Diagnosis not present

## 2015-06-29 DIAGNOSIS — I4891 Unspecified atrial fibrillation: Secondary | ICD-10-CM

## 2015-06-29 DIAGNOSIS — Z7901 Long term (current) use of anticoagulants: Secondary | ICD-10-CM | POA: Diagnosis not present

## 2015-06-29 DIAGNOSIS — I10 Essential (primary) hypertension: Secondary | ICD-10-CM

## 2015-06-29 LAB — POCT INR: INR: 2.1

## 2015-06-29 NOTE — Telephone Encounter (Signed)
Called pt and let him know, per Truitt Merle, NP, that his labs were stable.  The only recommendation is that we repeat BNP and BMET in 08/2015 when he sees Dr. Tamala Julian.  Order has been put in EPIC.  Pt verbalized understanding.

## 2015-06-29 NOTE — Telephone Encounter (Signed)
F/u     Pt returning phone call for labs.

## 2015-06-29 NOTE — Addendum Note (Signed)
Addended by: Gaetano Net on: 06/29/2015 11:15 AM   Modules accepted: Orders

## 2015-06-29 NOTE — Telephone Encounter (Signed)
-----  Message from Burtis Junes, NP sent at 06/29/2015  9:02 AM EST ----- Ok to report. Labs are stable. His renal function is stable.  BNP - fluid level - continues to improve. Add BMET and BNP to his visit with Dr. Tamala Julian.

## 2015-07-21 ENCOUNTER — Ambulatory Visit (INDEPENDENT_AMBULATORY_CARE_PROVIDER_SITE_OTHER): Payer: Medicare Other | Admitting: *Deleted

## 2015-07-21 DIAGNOSIS — I6350 Cerebral infarction due to unspecified occlusion or stenosis of unspecified cerebral artery: Secondary | ICD-10-CM

## 2015-07-21 DIAGNOSIS — I4891 Unspecified atrial fibrillation: Secondary | ICD-10-CM

## 2015-07-21 DIAGNOSIS — Z7901 Long term (current) use of anticoagulants: Secondary | ICD-10-CM

## 2015-07-21 LAB — POCT INR: INR: 2.2

## 2015-08-20 ENCOUNTER — Ambulatory Visit (INDEPENDENT_AMBULATORY_CARE_PROVIDER_SITE_OTHER): Payer: Medicare Other | Admitting: Internal Medicine

## 2015-08-20 ENCOUNTER — Ambulatory Visit (INDEPENDENT_AMBULATORY_CARE_PROVIDER_SITE_OTHER): Payer: Medicare Other

## 2015-08-20 ENCOUNTER — Encounter: Payer: Self-pay | Admitting: Internal Medicine

## 2015-08-20 VITALS — BP 114/74 | HR 92 | Ht 70.0 in | Wt 204.4 lb

## 2015-08-20 DIAGNOSIS — I10 Essential (primary) hypertension: Secondary | ICD-10-CM | POA: Diagnosis not present

## 2015-08-20 DIAGNOSIS — Z7901 Long term (current) use of anticoagulants: Secondary | ICD-10-CM

## 2015-08-20 DIAGNOSIS — I6350 Cerebral infarction due to unspecified occlusion or stenosis of unspecified cerebral artery: Secondary | ICD-10-CM

## 2015-08-20 DIAGNOSIS — J9611 Chronic respiratory failure with hypoxia: Secondary | ICD-10-CM

## 2015-08-20 DIAGNOSIS — I4891 Unspecified atrial fibrillation: Secondary | ICD-10-CM | POA: Diagnosis not present

## 2015-08-20 DIAGNOSIS — I480 Paroxysmal atrial fibrillation: Secondary | ICD-10-CM

## 2015-08-20 DIAGNOSIS — I2510 Atherosclerotic heart disease of native coronary artery without angina pectoris: Secondary | ICD-10-CM

## 2015-08-20 LAB — POCT INR: INR: 1.9

## 2015-08-20 NOTE — Progress Notes (Signed)
PCP:  Foye Spurling, MD  The patient presents today for routine EP followup.  His SOB continues to progress.  He is felt to have pulmonary fibrosis and has been followed by Dr Annie Sable.  He is on home O2.  He also has severe pulmonary hypertension and moderate CAD for which he is managed medically.  He was seen by Truitt Merle several months ago and has plans to see Dr Tamala Julian later this month.  He has occasional edema.   Today, he denies symptoms of palpitations, chest pain, orthopnea, PND, lower extremity edema, dizziness, presyncope, syncope, or neurologic sequela.  The patient feels that he is tolerating medications without difficulties and is otherwise without complaint today.   Past Medical History  Diagnosis Date  . A-fib     paroxysmal  . Hypertension   . 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   Current outpatient prescriptions:  .  allopurinol (ZYLOPRIM) 100 MG tablet, Take 100 mg by mouth daily., Disp: , Rfl:  .  atorvastatin (LIPITOR) 10 MG tablet, Take 1 tablet (10 mg total) by mouth daily., Disp: 90 tablet, Rfl: 3 .  BYSTOLIC 5 MG tablet, Take 5 mg by mouth daily., Disp: , Rfl: 11 .  Calcium Carbonate-Vitamin D (CALCIUM 600+D) 600-200 MG-UNIT TABS, Take 1 tablet by mouth 2 (two) times daily., Disp: , Rfl:  .  Carboxymethylcellulose Sodium (REFRESH TEARS OP), Apply 1 drop to eye daily as needed (dry eyes). , Disp: , Rfl:  .  fexofenadine (ALLEGRA) 180 MG tablet, Take 180 mg by mouth daily as needed for allergies. , Disp: , Rfl:  .  fluticasone (FLOVENT HFA) 44 MCG/ACT inhaler, Inhale 2 puffs into the lungs 2 (two) times daily., Disp: 1 Inhaler, Rfl: 12 .  furosemide (LASIX) 40 MG tablet, Take 40 mg by mouth daily., Disp: , Rfl: 2 .  Naproxen Sodium (ALEVE) 220 MG CAPS, Take 440 mg by mouth daily as needed (pain). Reported on 06/24/2015, Disp: , Rfl:  .  NON FORMULARY, Place 3 L  into the nose continuous., Disp: , Rfl:  .  Pirfenidone 267 MG CAPS, Take 3 capsules by mouth 3 (three) times daily., Disp: , Rfl:  .  Spacer/Aero-Holding Chambers (AEROCHAMBER MV) inhaler, Use as instructed, Disp: 1 each, Rfl: 0 .  SPIRIVA RESPIMAT 2.5 MCG/ACT AERS, INHALE 2 PUFFS INTO THE LUNGS DAILY, Disp: 4 g, Rfl: 5 .  valsartan-hydrochlorothiazide (DIOVAN-HCT) 320-12.5 MG per tablet, Take 1 tablet by mouth daily., Disp: , Rfl:  .  warfarin (COUMADIN) 5 MG tablet, TAKE AS DIRECTED, Disp: 120 tablet, Rfl: 1   No Known Allergies  History   Social History  . Marital Status: Married    Spouse Name: N/A  . Number of Children: N/A  . Years of Education: N/A   Occupational History  . Not on file.   Social History Main Topics  . Smoking status: Former Smoker    Types: Cigarettes    Quit date: 09/09/1991  . Smokeless tobacco: Never Used  . Alcohol Use: Yes  . Drug Use: No  . Sexual Activity:    Partners: Female   Other Topics Concern  . Not on file   Social History Narrative    Family History  Problem Relation Age of Onset  . Cancer Father     ROS-  All systems are reviewed and are negative except as  outlined in the HPI above   Physical Exam: Filed Vitals:   08/20/15 1017  BP: 114/74  Pulse: 92   GEN- The patient is well appearing, alert and oriented x 3 today.   Head- normocephalic, atraumatic Eyes-  Sclera clear, conjunctiva pink Ears- hearing intact Oropharynx- clear Neck- supple,  Lungs- Clear to ausculation bilaterally, normal work of breathing, on O2 Heart- Regular rate and rhythm, no murmurs, rubs or gallops, PMI not laterally displaced GI- soft, NT, ND, + BS Extremities- no clubbing, cyanosis, +2 edema MS- no significant deformity or atrophy Skin- no rash or lesion Psych- euthymic mood, full affect Neuro- strength and sensation are intact  ekg today reveals sinus rhythm with PVCs, LPHB, QTc 516  Assessment and Plan:  1. afib Well  controlled chads2vasc score is at least 4.  Continue coumadin    2. htn Stable 2 gram sodium restriction  3. Prior stroke Continue long term anticoagulation  4. Pulmonary fibrosis/ pulmonary HTN Followed by Dr Annie Sable  5. CAD Medical management advised Scheduled to see Dr Tamala Julian later this month already  Follow-up with general cardiology I will see again in 1 year for AF management if needed  Thompson Grayer MD, [facility] 08/20/2015 10:53 AM

## 2015-08-20 NOTE — Patient Instructions (Signed)
Medication Instructions:  Your physician recommends that you continue on your current medications as directed. Please refer to the Current Medication list given to you today.   Labwork: none  Testing/Procedures: none  Follow-Up: Your physician wants you to follow-up in: 12 months with Dr. Rayann Heman. You will receive a reminder letter in the mail two months in advance. If you don't receive a letter, please call our office to schedule the follow-up appointment.   Any Other Special Instructions Will Be Listed Below (If Applicable).     If you need a refill on your cardiac medications before your next appointment, please call your pharmacy.

## 2015-08-26 ENCOUNTER — Encounter: Payer: Self-pay | Admitting: Internal Medicine

## 2015-09-01 ENCOUNTER — Telehealth: Payer: Self-pay | Admitting: Internal Medicine

## 2015-09-01 NOTE — Telephone Encounter (Signed)
Called spoke with pt. He did not need his esbriet refilled. He was calling to see where he could find lip balm that had spf of 50 or higher.  He reports the esbriet agent he spoke with advised him to contact his doctors office. Advised pt he could try any local pharmacy and see what they may have.  He will do so. nothing further needed

## 2015-09-03 ENCOUNTER — Ambulatory Visit (INDEPENDENT_AMBULATORY_CARE_PROVIDER_SITE_OTHER): Payer: Medicare Other | Admitting: Interventional Cardiology

## 2015-09-03 ENCOUNTER — Encounter: Payer: Self-pay | Admitting: Interventional Cardiology

## 2015-09-03 ENCOUNTER — Other Ambulatory Visit: Payer: Medicare Other | Admitting: *Deleted

## 2015-09-03 VITALS — BP 114/86 | HR 76 | Ht 70.0 in | Wt 202.1 lb

## 2015-09-03 DIAGNOSIS — I10 Essential (primary) hypertension: Secondary | ICD-10-CM

## 2015-09-03 DIAGNOSIS — I480 Paroxysmal atrial fibrillation: Secondary | ICD-10-CM | POA: Diagnosis not present

## 2015-09-03 DIAGNOSIS — I2510 Atherosclerotic heart disease of native coronary artery without angina pectoris: Secondary | ICD-10-CM

## 2015-09-03 DIAGNOSIS — Z7901 Long term (current) use of anticoagulants: Secondary | ICD-10-CM | POA: Diagnosis not present

## 2015-09-03 DIAGNOSIS — E785 Hyperlipidemia, unspecified: Secondary | ICD-10-CM

## 2015-09-03 DIAGNOSIS — J84112 Idiopathic pulmonary fibrosis: Secondary | ICD-10-CM

## 2015-09-03 DIAGNOSIS — R0600 Dyspnea, unspecified: Secondary | ICD-10-CM

## 2015-09-03 DIAGNOSIS — I6350 Cerebral infarction due to unspecified occlusion or stenosis of unspecified cerebral artery: Secondary | ICD-10-CM

## 2015-09-03 DIAGNOSIS — I4891 Unspecified atrial fibrillation: Secondary | ICD-10-CM

## 2015-09-03 LAB — BASIC METABOLIC PANEL
BUN: 28 mg/dL — ABNORMAL HIGH (ref 7–25)
CO2: 24 mmol/L (ref 20–31)
Calcium: 9.5 mg/dL (ref 8.6–10.3)
Chloride: 105 mmol/L (ref 98–110)
Creat: 1.62 mg/dL — ABNORMAL HIGH (ref 0.70–1.18)
Glucose, Bld: 87 mg/dL (ref 65–99)
Potassium: 4 mmol/L (ref 3.5–5.3)
Sodium: 143 mmol/L (ref 135–146)

## 2015-09-03 LAB — BRAIN NATRIURETIC PEPTIDE: Brain Natriuretic Peptide: 750.8 pg/mL — ABNORMAL HIGH (ref <100)

## 2015-09-03 NOTE — Patient Instructions (Signed)
Medication Instructions:  Your physician recommends that you continue on your current medications as directed. Please refer to the Current Medication list given to you today.   Labwork: We will call you with your lab results  Testing/Procedures: None ordered  Follow-Up: Your physician recommends that you schedule a follow-up appointment in: 3 months   Any Other Special Instructions Will Be Listed Below (If Applicable).     If you need a refill on your cardiac medications before your next appointment, please call your pharmacy.

## 2015-09-03 NOTE — Progress Notes (Signed)
Cardiology Office Note   Date:  09/03/2015   ID:  [name], DOB 1940/11/27, MRN [id number]  PCP:  Foye Spurling, MD  Cardiologist:  Sinclair Grooms, MD   Chief Complaint  Patient presents with  . Coronary Artery Disease      History of Present Illness: Patrick Weiss is a 75 y.o. male who presents for pulmonary fibrosis, severe pulmonary hypertension, mild chronic combined systolic and diastolic heart failure, history of atrial fibrillation, and chronic anticoagulation therapy.  Still with some lower extremity swelling. Improved on current diuretic regimen but still with significant edema. 24 7 oxygen therapy is being used. This pain in cardiac rehabilitation. No episodes of syncope. No medication side effects.    Past Medical History  Diagnosis Date  . A-fib (HCC)     paroxysmal  . Hypertension   . 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 Back surgery  2001  . Tibia fracture surgery  1975  . Cardiac catheterization N/A 04/28/2015    Procedure: Right/Left Heart Cath and Coronary Angiography;  Surgeon: Belva Crome, MD;  Location: [facility] [facility];  Service: Cardiovascular;  Laterality: N/A;     Current Outpatient Prescriptions  Medication Sig Dispense Refill  . allopurinol (ZYLOPRIM) 100 MG tablet Take 100 mg by mouth daily.    Marland Kitchen atorvastatin (LIPITOR) 10 MG tablet Take 1 tablet (10 mg total) by mouth daily. 90 tablet 3  . BYSTOLIC 5 MG tablet Take 5 mg by mouth daily.  11  . Calcium Carbonate-Vitamin D (CALCIUM 600+D) 600-200 MG-UNIT TABS Take 1 tablet by mouth 2 (two) times daily.    . Carboxymethylcellulose Sodium (REFRESH TEARS OP) Apply 1 drop to eye daily as needed (dry eyes).     . fexofenadine (ALLEGRA) 180 MG tablet Take 180 mg by mouth daily as needed for allergies.     . fluticasone (FLOVENT HFA) 44 MCG/ACT inhaler Inhale 2  puffs into the lungs 2 (two) times daily. 1 Inhaler 12  . furosemide (LASIX) 40 MG tablet Take two (2) tablets (80 mg total) by mouth every morning and take one (1) tablet (40 mg total) by mouth every evening.  2  . Naproxen Sodium (ALEVE) 220 MG CAPS Take 440 mg by mouth daily as needed (pain). Reported on 06/24/2015    . NON FORMULARY Place 3 L into the nose continuous.    . Pirfenidone 267 MG CAPS Take 3 capsules by mouth 3 (three) times daily.    Marland Kitchen Spacer/Aero-Holding Chambers (AEROCHAMBER MV) inhaler Use as instructed 1 each 0  . SPIRIVA RESPIMAT 2.5 MCG/ACT AERS INHALE 2 PUFFS INTO THE LUNGS DAILY 4 g 5  . valsartan-hydrochlorothiazide (DIOVAN-HCT) 320-12.5 MG per tablet Take 1 tablet by mouth daily.    Marland Kitchen warfarin (COUMADIN) 5 MG tablet TAKE AS DIRECTED 120 tablet 1   No current facility-administered medications for this visit.    Allergies:   Review of patient's allergies indicates no known allergies.    Social History:  The patient  reports that he quit smoking about 24 years ago. His smoking use included Cigarettes. He has a 30 pack-year smoking history. He has never used smokeless tobacco. He reports that he drinks alcohol. He reports that he does not use illicit drugs.   Family History:  The patient's family history includes Cancer in  his father.    ROS:  Please see the history of present illness.   Otherwise, review of systems are positive for change in appetite, recent chills, bilateral lower extremity swelling, hearing loss, cough, rash, dizziness, easy bruising, difficulty with balance, episodes of anxiety and bilateral lower extremity pain..   All other systems are reviewed and negative.    PHYSICAL EXAM: VS:  BP 114/86 mmHg  Pulse 76  Ht _0  (1.778 m)  Wt 202 lb 1.9 oz (91.681 kg)  BMI 29.00 kg/m2 , BMI Body mass index is 29 kg/(m^2). GEN: Well nourished, well developed, in no acute distress HEENT: normal Neck: no JVD, carotid bruits, or masses Cardiac: IRR.   There is no murmur, rub, or gallop. There is 2+ edema. Respiratory:  clear to auscultation bilaterally, normal work of breathing. GI: soft, nontender, nondistended, + BS MS: no deformity or atrophy Skin: warm and dry, no rash Neuro:  Strength and sensation are intact Psych: euthymic mood, full affect   EKG:  EKG is not ordered today. The ekg reveals right atrial abnormality, right axis deviation, right ventricular hypertrophy.   Recent Labs: 04/22/2015: Hemoglobin 18.5*; Platelets 186 06/15/2015: ALT 27 06/25/2015: BUN 19; Creat 1.49*; Potassium 4.0; Sodium 143    Lipid Panel    Component Value Date/Time   CHOL 114* 06/15/2015 0828   TRIG 61 06/15/2015 0828   HDL 41 06/15/2015 0828   CHOLHDL 2.8 06/15/2015 0828   VLDL 12 06/15/2015 0828   LDLCALC 61 06/15/2015 0828      Wt Readings from Last 3 Encounters:  09/03/15 202 lb 1.9 oz (91.681 kg)  08/20/15 204 lb 6.4 oz (92.715 kg)  06/24/15 192 lb (87.091 kg)      Other studies Reviewed: Additional studies/ records that were reviewed today include: Recent electronic health record additions.. The findings include severe pulmonary hypertension, mild to moderate obstructive coronary disease, and pulmonary fibrosis with chronic O2 requirement..    ASSESSMENT AND PLAN:  1. Coronary artery disease involving native coronary artery of native heart without angina pectoris Asymptomatic with reference to angina.  2. Paroxysmal atrial fibrillation (HCC) No particular symptoms related to palpitations or tachycardia  3. Long term (current) use of anticoagulants No bleeding complications  4. HYPERTENSION, BENIGN Well controlled  5. 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 There is evidence of volume overload. 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  The patient has the following concerns regarding medicines: .  The following changes/actions have been instituted:    Lab work is being done today. Further recommendations may be pending  He is still mildly volume overloaded. Consider more aggressive Lasix or adding spironolactone  Low-salt diet  Labs/ tests ordered today include:  No orders of the defined types were placed in this encounter.     Disposition:   FU with HS in 3 month  Signed, Sinclair Grooms, MD  09/03/2015 9:32 AM    [facility] [place], [place], [place]  [address] Phone: [phone]; Fax: [phone]

## 2015-09-07 ENCOUNTER — Telehealth: Payer: Self-pay | Admitting: Internal Medicine

## 2015-09-07 ENCOUNTER — Ambulatory Visit (INDEPENDENT_AMBULATORY_CARE_PROVIDER_SITE_OTHER): Payer: Medicare Other | Admitting: Internal Medicine

## 2015-09-07 ENCOUNTER — Other Ambulatory Visit (INDEPENDENT_AMBULATORY_CARE_PROVIDER_SITE_OTHER): Payer: Medicare Other

## 2015-09-07 ENCOUNTER — Encounter: Payer: Self-pay | Admitting: Internal Medicine

## 2015-09-07 VITALS — BP 132/84 | HR 70 | Ht 70.0 in | Wt 203.0 lb

## 2015-09-07 DIAGNOSIS — J9611 Chronic respiratory failure with hypoxia: Secondary | ICD-10-CM | POA: Diagnosis not present

## 2015-09-07 DIAGNOSIS — R5383 Other fatigue: Secondary | ICD-10-CM

## 2015-09-07 DIAGNOSIS — J84112 Idiopathic pulmonary fibrosis: Secondary | ICD-10-CM

## 2015-09-07 DIAGNOSIS — I6350 Cerebral infarction due to unspecified occlusion or stenosis of unspecified cerebral artery: Secondary | ICD-10-CM

## 2015-09-07 DIAGNOSIS — R600 Localized edema: Secondary | ICD-10-CM | POA: Insufficient documentation

## 2015-09-07 LAB — HEPATIC FUNCTION PANEL
ALBUMIN: 3.9 g/dL (ref 3.5–5.2)
ALK PHOS: 50 U/L (ref 39–117)
ALT: 18 U/L (ref 0–53)
AST: 20 U/L (ref 0–37)
BILIRUBIN DIRECT: 0.3 mg/dL (ref 0.0–0.3)
TOTAL PROTEIN: 6.5 g/dL (ref 6.0–8.3)
Total Bilirubin: 0.8 mg/dL (ref 0.2–1.2)

## 2015-09-07 LAB — PHOSPHORUS: Phosphorus: 3.1 mg/dL (ref 2.3–4.6)

## 2015-09-07 LAB — MAGNESIUM: Magnesium: 2.3 mg/dL (ref 1.5–2.5)

## 2015-09-07 NOTE — Telephone Encounter (Signed)
Pl tell him to cut esbriet down to 2tablet three times a day - at this dose drug is still effective against IPF He is to call back in 1-2 weeks  Dr. Brand Males, M.D., [facility].C.P Pulmonary and Critical Care Medicine Staff Physician [facility] Pager: [phone], If no answer or between  15:00h - 7:00h: call [phone]  09/07/2015 5:33 PM

## 2015-09-07 NOTE — Patient Instructions (Addendum)
ICD-9-CM ICD-10-CM   1. IPF (idiopathic pulmonary fibrosis) (HCC) 516.31 J84.112   2. Chronic respiratory failure with hypoxia (HCC) 518.83 J96.11    799.02    3. Other fatigue 780.79 R53.83   4. Pedal edema 782.3 R60.0     Check lft, mag, and phos blood work Freight forwarder with [facility] To get portable o2 system - keep pulse ox > 86% at all times Work with Dr Tamala Julian about leg swelling - continue to do that Consider stopping esbriet to see if fatigue goes away - let us know yoru decision tomorrow Consider participation in IPF registry study    - take info  - will have Claudia Desanctis call you 09/08/15   Followup  - depending on blood work and decision on stopping esbriet

## 2015-09-07 NOTE — Telephone Encounter (Signed)
Pt seen today with MR- has decided after seeing MR that he wants to taper down on the Esbriet.  Pt requests call to advise on dosing and how to taper off.  Please advise Dr Chase Caller (respond to triage). Thanks.

## 2015-09-07 NOTE — Progress Notes (Signed)
Subjective:     Patient ID: Patrick Weiss, male   DOB: 1940/12/04, 75 y.o.   MRN: [id number]  HPI  OV 09/07/2015  Chief Complaint  Patient presents with  . Follow-up    Pt does not feel that Esbriet is helping much. Pt uses 2.5-4Liters O2 per/min.    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 This is associated with cor pulmonale and also chronic systolic heart failure ejection fraction 45% in February 2016. He is on anti-fibrotic therapy Esbriet since approximately November 2016.   He presents for follow-up with his wife. Overall he feels that the drug might not be helping him. He understands that the anti-fibrotic therapies for prevention and is not necessarily designed to make him feel better. Wife reports that he had a low appetite initially after starting the drug but since then his appetite is picked back up to normal. However he is having fatigue that is significantly worse after starting the drug. It is moderate in intensity. His oxygen level uses around the same. He is also reporting significant bilateral pedal edema for which she has seen Dr. Pernell Dupre. Most recent chemistries reveal a BNP of 750 and a creatinine of 1.62 mg percent. The creatinine is slightly higher than December 2016 when it was only 1.5 mg percent. His last liver function test was in December 2016 and it was normal. He is due for repeat liver function test today. Off note, he is not ideal candidate for Ofev due to anticoagulation  He is also reporting changes in his sleep pattern and is wondering if it's because of IPF anti-fibrotic therapy    has a past medical history of A-fib ([place]); Hypertension; and Stroke [facility]).   reports that he quit smoking about 24 years ago. His smoking use included Cigarettes. He has a 30 pack-year smoking history. He has never used smokeless tobacco.  Past Surgical History  Procedure  Laterality Date  . Back surgery  2001  . Tibia fracture surgery  1975  . Cardiac catheterization N/A 04/28/2015    Procedure: Right/Left Heart Cath and Coronary Angiography;  Surgeon: Belva Crome, MD;  Location: [place] [facility];  Service: Cardiovascular;  Laterality: N/A;    No Known Allergies  Immunization History  Administered Date(s) Administered  . Influenza Split 03/11/2014  . Influenza,inj,Quad PF,36+ Mos 03/16/2015    Family History  Problem Relation Age of Onset  . Cancer Father      Current outpatient prescriptions:  .  allopurinol (ZYLOPRIM) 100 MG tablet, Take 100 mg by mouth daily., Disp: , Rfl:  .  atorvastatin (LIPITOR) 10 MG tablet, Take 1 tablet (10 mg total) by mouth daily., Disp: 90 tablet, Rfl: 3 .  BYSTOLIC 5 MG tablet, Take 5 mg by mouth daily., Disp: , Rfl: 11 .  Calcium Carbonate-Vitamin D (CALCIUM 600+D) 600-200 MG-UNIT TABS, Take 1 tablet by mouth 2 (two) times daily., Disp: , Rfl:  .  Carboxymethylcellulose Sodium (REFRESH TEARS OP), Apply 1 drop to eye daily as needed (dry eyes). , Disp: , Rfl:  .  fexofenadine (ALLEGRA) 180 MG tablet, Take 180 mg by mouth daily as needed for allergies. , Disp: , Rfl:  .  fluticasone (FLOVENT HFA) 44 MCG/ACT inhaler, Inhale 2 puffs into the lungs 2 (two) times daily., Disp: 1 Inhaler, Rfl: 12 .  furosemide (LASIX) 40 MG tablet, Take two (2) tablets (80 mg total) by mouth every morning and take  one (1) tablet (40 mg total) by mouth every evening., Disp: , Rfl: 2 .  NON FORMULARY, Place 3 L into the nose continuous., Disp: , Rfl:  .  Pirfenidone 267 MG CAPS, Take 3 capsules by mouth 3 (three) times daily., Disp: , Rfl:  .  Spacer/Aero-Holding Chambers (AEROCHAMBER MV) inhaler, Use as instructed, Disp: 1 each, Rfl: 0 .  SPIRIVA RESPIMAT 2.5 MCG/ACT AERS, INHALE 2 PUFFS INTO THE LUNGS DAILY, Disp: 4 g, Rfl: 5 .  valsartan-hydrochlorothiazide (DIOVAN-HCT) 320-12.5 MG per tablet, Take 1 tablet by mouth daily., Disp: , Rfl:   .  warfarin (COUMADIN) 5 MG tablet, TAKE AS DIRECTED, Disp: 120 tablet, Rfl: 1     Review of Systems     Objective:   Physical Exam  Constitutional: He is oriented to person, place, and time. He appears well-developed and well-nourished. No distress.  HENT:  Head: Normocephalic and atraumatic.  Right Ear: External ear normal.  Left Ear: External ear normal.  Mouth/Throat: Oropharynx is clear and moist. No oropharyngeal exudate.  Eyes: Conjunctivae and EOM are normal. Pupils are equal, round, and reactive to light. Right eye exhibits no discharge. Left eye exhibits no discharge. No scleral icterus.  Neck: Normal range of motion. Neck supple. No JVD present. No tracheal deviation present. No thyromegaly present.  Cardiovascular: Normal rate, regular rhythm and intact distal pulses.  Exam reveals no gallop and no friction rub.   No murmur heard. Pulmonary/Chest: Effort normal and breath sounds normal. No respiratory distress. He has no wheezes. He has no rales. He exhibits no tenderness.  Abdominal: Soft. Bowel sounds are normal. He exhibits no distension and no mass. There is no tenderness. There is no rebound and no guarding.  Musculoskeletal: Normal range of motion. He exhibits edema. He exhibits no tenderness.  Extensive 3+ edema present  Lymphadenopathy:    He has no cervical adenopathy.  Neurological: He is alert and oriented to person, place, and time. He has normal reflexes. No cranial nerve deficit. Coordination normal.  Skin: Skin is warm and dry. No rash noted. He is not diaphoretic. No erythema. No pallor.  Psychiatric: He has a normal mood and affect. His behavior is normal. Judgment and thought content normal.  Nursing note and vitals reviewed.  Filed Vitals:   09/07/15 1625  BP: 132/84  Pulse: 70   Filed Vitals:   09/07/15 1625  BP: 132/84  Pulse: 70  Height: _0  (1.778 m)  Weight: 203 lb (92.08 kg)  SpO2: 92%       Assessment:       ICD-9-CM ICD-10-CM    1. IPF (idiopathic pulmonary fibrosis) (HCC) 516.31 J84.112 Hepatic function panel     Magnesium     Phosphorus     Ambulatory Referral for DME  2. Chronic respiratory failure with hypoxia (HCC) 518.83 J96.11 Hepatic function panel   799.02  Magnesium     Phosphorus     Ambulatory Referral for DME  3. Other fatigue 780.79 R53.83   4. Pedal edema 782.3 R60.0        Plan:      Check lft, mag, and phos blood work Freight forwarder with [facility] To get portable o2 system - keep pulse ox > 86% at all times Work with Dr Tamala Julian about leg swelling - continue to do that Consider stopping esbriet to see if fatigue goes away - let us know yoru decision tomorrow Consider participation in IPF registry study    - take info  - will  have Claudia Desanctis call you 09/08/15   Followup  - depending on blood work and decision on stopping esbriet     > 50% of this > 25 min visit spent in face to face counseling or coordination of care    Dr. Brand Males, M.D., [facility].C.P Pulmonary and Critical Care Medicine Staff Physician [place] [facility] Pager: [phone], If no answer or between  15:00h - 7:00h: call [phone]  09/07/2015 5:35 PM

## 2015-09-08 ENCOUNTER — Ambulatory Visit (INDEPENDENT_AMBULATORY_CARE_PROVIDER_SITE_OTHER): Payer: Medicare Other | Admitting: Internal Medicine

## 2015-09-08 DIAGNOSIS — J84112 Idiopathic pulmonary fibrosis: Secondary | ICD-10-CM

## 2015-09-08 DIAGNOSIS — Z006 Encounter for examination for normal comparison and control in clinical research program: Secondary | ICD-10-CM

## 2015-09-08 NOTE — Progress Notes (Signed)
Quick Note:  lmtcb for pt. ______ 

## 2015-09-08 NOTE — Progress Notes (Signed)
Subject comes in to sign ICF for the IPF PRO registry.  We had a 30 minute conversation and the subject was able to ask questions. All good clinical practice was followed and the subject understands he will not be compensated for participation.  He also understands that his participation is voluntary and he may discontinue at anytime.  6 month follow up phone calls were discussed with the subject and he is in agreement to have those come through.  He voluntarily signed consent today and a copy was given to him.  Following consent, study procedures and questionaires were completed.   PI oversight  - agree with above. I did d/w him and his wife about registry before formal signed consent  Dr. Brand Males, M.D., [facility].C.P Pulmonary and Critical Care Medicine Staff Physician [place] [facility] Pager: [phone], If no answer or between  15:00h - 7:00h: call [phone]  10/07/2015 10:33 AM

## 2015-09-08 NOTE — Telephone Encounter (Signed)
Patient notified of Dr. Golden Pop recommendations. Verbalized understanding. Nothing further needed.

## 2015-09-08 NOTE — Progress Notes (Signed)
Quick Note:  Pt informed of results per MR. Pt verbalized understanding and denied any further questions or concerns at this time.   ______

## 2015-09-17 ENCOUNTER — Ambulatory Visit (INDEPENDENT_AMBULATORY_CARE_PROVIDER_SITE_OTHER): Payer: Medicare Other | Admitting: Pharmacist

## 2015-09-17 DIAGNOSIS — I6350 Cerebral infarction due to unspecified occlusion or stenosis of unspecified cerebral artery: Secondary | ICD-10-CM

## 2015-09-17 DIAGNOSIS — Z7901 Long term (current) use of anticoagulants: Secondary | ICD-10-CM | POA: Diagnosis not present

## 2015-09-17 DIAGNOSIS — I4891 Unspecified atrial fibrillation: Secondary | ICD-10-CM

## 2015-09-17 DIAGNOSIS — I480 Paroxysmal atrial fibrillation: Secondary | ICD-10-CM | POA: Diagnosis not present

## 2015-09-17 LAB — POCT INR: INR: 1.8

## 2015-10-07 ENCOUNTER — Telehealth: Payer: Self-pay | Admitting: Internal Medicine

## 2015-10-07 NOTE — Telephone Encounter (Signed)
lmtcb for pt.    Brand Males, MD at 10/07/2015 10:22 AM     Status: Signed       Expand All Collapse All   He was To call back to see how he was doing int terms of fatigue on lower dose of esbriet at 2tab tid. BUt he did not call back. Please call and find out. Pl give him fu in 3 months

## 2015-10-07 NOTE — Telephone Encounter (Signed)
See accidentally closed phone note  He needs fu in 3 mo  Need to know how his fatigue is on lower dose 2tab tid of esbriet?

## 2015-10-07 NOTE — Telephone Encounter (Signed)
He was  To call back to see how he was doing int terms of fatigue on lower dose of esbriet at 2tab tid. BUt he did not call back. Please call and find out. Pl give him fu in 3 months  Dr. Brand Males, M.D., [facility].C.P Pulmonary and Critical Care Medicine Staff Physician [place] [facility] Pager: [phone], If no answer or between  15:00h - 7:00h: call [phone]  10/07/2015 10:23 AM

## 2015-10-08 NOTE — Telephone Encounter (Signed)
lmtcb for pt.  

## 2015-10-08 NOTE — Telephone Encounter (Signed)
Pt returned call He stated that his energy levels have returned to baseline on the 2 tabs BID Edema has also decreased and this is being followed by cardiology 3 month follow up scheduled from the date of pt's last visit >> 5.25.17 @ 3pm  Will sign and forward to MR as FYI.

## 2015-10-15 ENCOUNTER — Ambulatory Visit (INDEPENDENT_AMBULATORY_CARE_PROVIDER_SITE_OTHER): Payer: Medicare Other

## 2015-10-15 DIAGNOSIS — Z7901 Long term (current) use of anticoagulants: Secondary | ICD-10-CM

## 2015-10-15 DIAGNOSIS — I6350 Cerebral infarction due to unspecified occlusion or stenosis of unspecified cerebral artery: Secondary | ICD-10-CM

## 2015-10-15 DIAGNOSIS — I480 Paroxysmal atrial fibrillation: Secondary | ICD-10-CM

## 2015-10-15 DIAGNOSIS — I4891 Unspecified atrial fibrillation: Secondary | ICD-10-CM

## 2015-10-15 LAB — POCT INR: INR: 1.8

## 2015-10-21 ENCOUNTER — Telehealth: Payer: Self-pay | Admitting: Internal Medicine

## 2015-10-21 NOTE — Telephone Encounter (Signed)
Spoke with pt. He would like to know when we placed the order for his POC. This order was placed on 09/07/15. He has not heard anything from [place]. I called [employer] and spoke with Gilda. She states that the pt was approved on 10/17/15 for the POC and it will be 6-8 weeks before it's delivered. The pt is aware of the above information. Nothing further was needed.

## 2015-11-02 ENCOUNTER — Inpatient Hospital Stay (HOSPITAL_COMMUNITY)
Admission: EM | Admit: 2015-11-02 | Discharge: 2015-11-04 | DRG: 291 | Disposition: A | Discharge status: Home / Self Care | Payer: Medicare Other | Attending: Family Medicine | Admitting: Family Medicine

## 2015-11-02 ENCOUNTER — Encounter (HOSPITAL_COMMUNITY): Payer: Self-pay | Admitting: Emergency Medicine

## 2015-11-02 ENCOUNTER — Emergency Department (HOSPITAL_COMMUNITY): Payer: Medicare Other

## 2015-11-02 DIAGNOSIS — J84112 Idiopathic pulmonary fibrosis: Secondary | ICD-10-CM | POA: Diagnosis not present

## 2015-11-02 DIAGNOSIS — I5043 Acute on chronic combined systolic (congestive) and diastolic (congestive) heart failure: Secondary | ICD-10-CM | POA: Diagnosis not present

## 2015-11-02 DIAGNOSIS — J449 Chronic obstructive pulmonary disease, unspecified: Secondary | ICD-10-CM | POA: Diagnosis present

## 2015-11-02 DIAGNOSIS — I130 Hypertensive heart and chronic kidney disease with heart failure and stage 1 through stage 4 chronic kidney disease, or unspecified chronic kidney disease: Principal | ICD-10-CM | POA: Diagnosis present

## 2015-11-02 DIAGNOSIS — Z66 Do not resuscitate: Secondary | ICD-10-CM | POA: Diagnosis present

## 2015-11-02 DIAGNOSIS — R600 Localized edema: Secondary | ICD-10-CM | POA: Diagnosis present

## 2015-11-02 DIAGNOSIS — Z8673 Personal history of transient ischemic attack (TIA), and cerebral infarction without residual deficits: Secondary | ICD-10-CM

## 2015-11-02 DIAGNOSIS — Z7901 Long term (current) use of anticoagulants: Secondary | ICD-10-CM

## 2015-11-02 DIAGNOSIS — I2510 Atherosclerotic heart disease of native coronary artery without angina pectoris: Secondary | ICD-10-CM | POA: Diagnosis present

## 2015-11-02 DIAGNOSIS — N183 Chronic kidney disease, stage 3 (moderate): Secondary | ICD-10-CM | POA: Diagnosis present

## 2015-11-02 DIAGNOSIS — Z809 Family history of malignant neoplasm, unspecified: Secondary | ICD-10-CM

## 2015-11-02 DIAGNOSIS — I480 Paroxysmal atrial fibrillation: Secondary | ICD-10-CM | POA: Diagnosis present

## 2015-11-02 DIAGNOSIS — I272 Other secondary pulmonary hypertension: Secondary | ICD-10-CM | POA: Diagnosis present

## 2015-11-02 DIAGNOSIS — I509 Heart failure, unspecified: Secondary | ICD-10-CM

## 2015-11-02 DIAGNOSIS — Z87891 Personal history of nicotine dependence: Secondary | ICD-10-CM

## 2015-11-02 HISTORY — DX: Reserved for inherently not codable concepts without codable children: IMO0001

## 2015-11-02 HISTORY — DX: Pulmonary fibrosis, unspecified: J84.10

## 2015-11-02 HISTORY — DX: Heart failure, unspecified: I50.9

## 2015-11-02 LAB — BASIC METABOLIC PANEL
ANION GAP: 13 (ref 5–15)
BUN: 28 mg/dL — ABNORMAL HIGH (ref 6–20)
CALCIUM: 9.9 mg/dL (ref 8.9–10.3)
CO2: 23 mmol/L (ref 22–32)
CREATININE: 1.71 mg/dL — AB (ref 0.61–1.24)
Chloride: 106 mmol/L (ref 101–111)
GFR calc Af Amer: 44 mL/min — ABNORMAL LOW (ref >60)
GFR, EST NON AFRICAN AMERICAN: 38 mL/min — AB (ref >60)
GLUCOSE: 94 mg/dL (ref 65–99)
Potassium: 4.3 mmol/L (ref 3.5–5.1)
Sodium: 142 mmol/L (ref 135–145)

## 2015-11-02 LAB — CBC
HCT: 46 % (ref 39.0–52.0)
HEMOGLOBIN: 15.1 g/dL (ref 13.0–17.0)
MCH: 29.3 pg (ref 26.0–34.0)
MCHC: 32.8 g/dL (ref 30.0–36.0)
MCV: 89.1 fL (ref 78.0–100.0)
PLATELETS: 200 10*3/uL (ref 150–400)
RBC: 5.16 MIL/uL (ref 4.22–5.81)
RDW: 17.5 % — AB (ref 11.5–15.5)
WBC: 5.2 10*3/uL (ref 4.0–10.5)

## 2015-11-02 LAB — BRAIN NATRIURETIC PEPTIDE: B Natriuretic Peptide: 805 pg/mL — ABNORMAL HIGH (ref 0.0–100.0)

## 2015-11-02 LAB — PROTIME-INR
INR: 1.99 — AB (ref 0.00–1.49)
Prothrombin Time: 22.5 seconds — ABNORMAL HIGH (ref 11.6–15.2)

## 2015-11-02 LAB — I-STAT TROPONIN, ED: TROPONIN I, POC: 0.01 ng/mL (ref 0.00–0.08)

## 2015-11-02 MED ORDER — CALCIUM CARBONATE-VITAMIN D 500-200 MG-UNIT PO TABS
1.0000 | ORAL_TABLET | Freq: Two times a day (BID) | ORAL | Status: DC
  Administered 2015-11-03 – 2015-11-04 (×3): 1 via ORAL
  Filled 2015-11-02 (×3): qty 1

## 2015-11-02 MED ORDER — FUROSEMIDE 10 MG/ML IJ SOLN
80.0000 mg | Freq: Once | INTRAMUSCULAR | Status: AC
Start: 2015-11-02 — End: 2015-11-02
  Administered 2015-11-02: 80 mg via INTRAVENOUS
  Filled 2015-11-02: qty 8

## 2015-11-02 MED ORDER — ONDANSETRON HCL 4 MG/2ML IJ SOLN: 4.0000 mg | Freq: Four times a day (QID) | INTRAMUSCULAR | Status: DC | PRN

## 2015-11-02 MED ORDER — LORATADINE 10 MG PO TABS
10.0000 mg | ORAL_TABLET | Freq: Every day | ORAL | Status: DC
  Administered 2015-11-03 – 2015-11-04 (×2): 10 mg via ORAL
  Filled 2015-11-02 (×2): qty 1

## 2015-11-02 MED ORDER — FUROSEMIDE 10 MG/ML IJ SOLN
60.0000 mg | Freq: Two times a day (BID) | INTRAMUSCULAR | Status: DC
  Administered 2015-11-03 – 2015-11-04 (×3): 60 mg via INTRAVENOUS
  Filled 2015-11-02 (×3): qty 6

## 2015-11-02 MED ORDER — TIOTROPIUM BROMIDE MONOHYDRATE 18 MCG IN CAPS
18.0000 ug | ORAL_CAPSULE | Freq: Every day | RESPIRATORY_TRACT | Status: DC
  Filled 2015-11-02: qty 5

## 2015-11-02 MED ORDER — ATORVASTATIN CALCIUM 10 MG PO TABS
10.0000 mg | ORAL_TABLET | Freq: Every day | ORAL | Status: DC
  Administered 2015-11-02 – 2015-11-04 (×3): 10 mg via ORAL
  Filled 2015-11-02 (×3): qty 1

## 2015-11-02 MED ORDER — ALLOPURINOL 100 MG PO TABS
100.0000 mg | ORAL_TABLET | Freq: Every day | ORAL | Status: DC
  Administered 2015-11-03 – 2015-11-04 (×2): 100 mg via ORAL
  Filled 2015-11-02 (×2): qty 1

## 2015-11-02 MED ORDER — ONDANSETRON HCL 4 MG PO TABS: 4.0000 mg | ORAL_TABLET | Freq: Four times a day (QID) | ORAL | Status: DC | PRN

## 2015-11-02 MED ORDER — PIRFENIDONE 267 MG PO CAPS
2.0000 | ORAL_CAPSULE | Freq: Three times a day (TID) | ORAL | Status: DC
  Administered 2015-11-02 – 2015-11-04 (×6): 2 via ORAL
  Filled 2015-11-02 (×4): qty 1

## 2015-11-02 MED ORDER — BUDESONIDE 0.25 MG/2ML IN SUSP
0.2500 mg | Freq: Two times a day (BID) | RESPIRATORY_TRACT | Status: DC
  Administered 2015-11-02 – 2015-11-04 (×4): 0.25 mg via RESPIRATORY_TRACT
  Filled 2015-11-02 (×4): qty 2

## 2015-11-02 NOTE — ED Notes (Signed)
Attempted report to 3E. 

## 2015-11-02 NOTE — ED Notes (Signed)
Attempted report to 3E.

## 2015-11-02 NOTE — Progress Notes (Signed)
ANTICOAGULATION CONSULT NOTE - Initial Consult  Pharmacy Consult for coumadin Indication: atrial fibrillation  No Known Allergies  Patient Measurements: Height: _0  (177.8 cm) Weight: 205 lb 8 oz (93.214 kg) IBW/kg (Calculated) : 73  Vital Signs: Temp: 97.8 F (36.6 C) (04/24 1800) Temp Source: Oral (04/24 1800) BP: 148/97 mmHg (04/24 1800) Pulse Rate: 85 (04/24 1800)  Labs:  Recent Labs  11/02/15 1250 11/02/15 1437  HGB 15.1  --   HCT 46.0  --   PLT 200  --   LABPROT  --  22.5*  INR  --  1.99*  CREATININE 1.71*  --     Estimated Creatinine Clearance: 43.5 mL/min (by C-G formula based on Cr of 1.71).   Medical History: Past Medical History  Diagnosis Date  . A-fib (HCC)     paroxysmal  . Hypertension   . 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 CHF (congestive heart failure) ([place])   . Pulmonary fibrosis (HCC)     Medications:  Prescriptions prior to admission  Medication Sig Dispense Refill Last Dose  . allopurinol (ZYLOPRIM) 100 MG tablet Take 100 mg by mouth daily.   11/02/2015 at Unknown time  . atorvastatin (LIPITOR) 10 MG tablet Take 1 tablet (10 mg total) by mouth daily. 90 tablet 3 11/02/2015 at Unknown time  . Calcium Carbonate-Vitamin D (CALCIUM 600+D) 600-200 MG-UNIT TABS Take 1 tablet by mouth 2 (two) times daily.   11/02/2015 at Unknown time  . Carboxymethylcellulose Sodium (REFRESH TEARS OP) Place 1 drop into both eyes daily as needed (dry eyes).    Past Week at Unknown time  . fexofenadine (ALLEGRA) 180 MG tablet Take 180 mg by mouth daily as needed for allergies.    11/02/2015 at Unknown time  . fluticasone (FLOVENT HFA) 44 MCG/ACT inhaler Inhale 2 puffs into the lungs 2 (two) times daily. 1 Inhaler 12 11/02/2015 at Unknown time  . furosemide (LASIX) 40 MG tablet Take 40 mg by mouth See admin instructions. Take two (2) tablets (80 mg total) by mouth every morning and take one (1)  tablet (40 mg total) by mouth every evening.  2 11/02/2015 at Unknown time  . NON FORMULARY Place 3 L into the nose continuous.   unknown at unknown  . Pirfenidone 267 MG CAPS Take 2 capsules by mouth 3 (three) times daily.    11/02/2015 at Unknown time  . Spacer/Aero-Holding Chambers (AEROCHAMBER MV) inhaler Use as instructed 1 each 0 unknown at unknown  . SPIRIVA RESPIMAT 2.5 MCG/ACT AERS INHALE 2 PUFFS INTO THE LUNGS DAILY 4 g 5 11/02/2015 at Unknown time  . valsartan (DIOVAN) 160 MG tablet Take 160 mg by mouth daily.   11/02/2015 at Unknown time  . warfarin (COUMADIN) 5 MG tablet Take 5 mg by mouth daily. Take 33m every day except on Sundays and wednesdays take 7.562mtablet (Splits the 26m77mn half to make total of 7.26mg73m 11/02/2015 at Unknown time  . warfarin (COUMADIN) 5 MG tablet TAKE AS DIRECTED (Patient not taking: Reported on 11/02/2015) 120 tablet 1 Not Taking at Unknown time   Scheduled:  . [START ON 11/03/2015] allopurinol  100 mg Oral Daily  . atorvastatin  10 mg Oral Daily  . budesonide  0.25 mg Inhalation BID  . [START ON 11/03/2015] calcium-vitamin D  1 tablet Oral BID  . [START ON 11/03/2015] furosemide  60 mg Intravenous Q12H  . [  START ON 11/03/2015] loratadine  10 mg Oral Daily  . Pirfenidone  2 capsule Oral TID  . tiotropium  18 mcg Inhalation Daily    Assessment: 75 yo male here with leg swelling and noted with HF. He is on coumadin PTA for afib and pharmacy consulted to dose. -INR= 1.99  Home dose: 54m/day except take 7.556mSuWe (last dose 4/24)  Goal of Therapy:  INR 2-3 Monitor platelets by anticoagulation protocol: Yes   Plan:  -No additional coumadin needed today -Daily PT/INR  AnHildred LaserPharm D 11/02/2015 6:41 PM

## 2015-11-02 NOTE — ED Provider Notes (Signed)
CSN: [id number]     Arrival date & time 11/02/15  1217 History   First MD Initiated Contact with Patient 11/02/15 1401     Chief Complaint  Patient presents with  . Shortness of Breath  . Leg Swelling     (Consider location/radiation/quality/duration/timing/severity/associated sxs/prior Treatment) HPI Patrick Weiss is a 75 y.o. male with a history of paroxysmal A. Fib on Coumadin, hypertension, stroke, CHF, pulmonary fibrosis, pulmonary hypertension, CAD, comes in for evaluation of leg swelling. Patient reports he has had increased leg swelling over the past 3 weeks. He reports he went to his primary care doctor today, Dr. Carlis Abbott, who sent to ED for evaluation for admission for diuresis. Patient denies any new chest pain, shortness of breath, cough, abdominal pain or leg pain. Reports he typically takes 80 mg of Lasix at home. Denies any other fevers, chills, recent travel or surgeries. Reports he used to wear compression stockings that seemed to help, but discontinued this therapy on his own. No other modifying factors.  Past Medical History  Diagnosis Date  . A-fib (HCC)     paroxysmal  . Hypertension   . 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 CHF (congestive heart failure) ([place])   . Pulmonary fibrosis [facility])    Past Surgical History  Procedure Laterality Date  . Back surgery  2001  . Tibia fracture surgery  1975  . Cardiac catheterization N/A 04/28/2015    Procedure: Right/Left Heart Cath and Coronary Angiography;  Surgeon: Belva Crome, MD;  Location: [place] [facility];  Service: Cardiovascular;  Laterality: N/A;   Family History  Problem Relation Age of Onset  . Cancer Father    Social History  Substance Use Topics  . Smoking status: Former Smoker -- 1.00 packs/day for 30 years    Types: Cigarettes    Quit date: 09/09/1991  . Smokeless tobacco: Never Used  . Alcohol Use: 0.0 oz/week    0 Standard  drinks or equivalent per week     Comment: glass of wine with meals    Review of Systems A 10 point review of systems was completed and was negative except for pertinent positives and negatives as mentioned in the history of present illness     Allergies  Review of patient's allergies indicates no known allergies.  Home Medications   Prior to Admission medications   Medication Sig Start Date End Date Taking? Authorizing Provider  allopurinol (ZYLOPRIM) 100 MG tablet Take 100 mg by mouth daily.    Historical Provider, MD  atorvastatin (LIPITOR) 10 MG tablet Take 1 tablet (10 mg total) by mouth daily. 05/08/15   Burtis Junes, NP  Calcium Carbonate-Vitamin D (CALCIUM 600+D) 600-200 MG-UNIT TABS Take 1 tablet by mouth 2 (two) times daily.    Historical Provider, MD  Carboxymethylcellulose Sodium (REFRESH TEARS OP) Apply 1 drop to eye daily as needed (dry eyes).     Historical Provider, MD  fexofenadine (ALLEGRA) 180 MG tablet Take 180 mg by mouth daily as needed for allergies.     Historical Provider, MD  fluticasone (FLOVENT HFA) 44 MCG/ACT inhaler Inhale 2 puffs into the lungs 2 (two) times daily. 03/11/15   Brand Males, MD  furosemide (LASIX) 40 MG tablet Take two (2) tablets (80 mg total) by mouth every morning and take one (1) tablet (40 mg total) by mouth every evening. 06/29/15   Historical Provider,  MD  NON FORMULARY Place 3 L into the nose continuous.    Historical Provider, MD  Pirfenidone 267 MG CAPS Take 3 capsules by mouth 3 (three) times daily.    Historical Provider, MD  Spacer/Aero-Holding Chambers (AEROCHAMBER MV) inhaler Use as instructed 04/03/15   Brand Males, MD  SPIRIVA RESPIMAT 2.5 MCG/ACT AERS INHALE 2 PUFFS INTO THE LUNGS DAILY 06/15/15   Brand Males, MD  valsartan (DIOVAN) 160 MG tablet Take 160 mg by mouth daily.    Historical Provider, MD  warfarin (COUMADIN) 5 MG tablet TAKE AS DIRECTED 05/11/15   Thompson Grayer, MD   BP 120/90 mmHg  Pulse 83   Temp(Src) 97.4 F (36.3 C) (Oral)  Resp 16  Ht _0  (1.778 m)  Wt 93.923 kg  BMI 29.71 kg/m2  SpO2 96% Physical Exam  Constitutional: He is oriented to person, place, and time. He appears well-developed and well-nourished.  Overall well-appearing African American male  HENT:  Head: Normocephalic and atraumatic.  Mouth/Throat: Oropharynx is clear and moist.  Eyes: Conjunctivae are normal. Pupils are equal, round, and reactive to light. Right eye exhibits no discharge. Left eye exhibits no discharge. No scleral icterus.  Neck: Neck supple.  Cardiovascular: Normal rate, regular rhythm and normal heart sounds.   Pulmonary/Chest: Effort normal and breath sounds normal. No respiratory distress. He has no wheezes. He has no rales.  4 L nasal cannula home dose, 99%.  Abdominal: Soft. There is no tenderness.  Musculoskeletal: He exhibits no tenderness.  Bilateral pitting edema 2+ in shins bilaterally, 1+ in bilateral thighs.  Neurological: He is alert and oriented to person, place, and time.  Cranial Nerves II-XII grossly intact  Skin: Skin is warm and dry. No rash noted.  Psychiatric: He has a normal mood and affect.  Nursing note and vitals reviewed.   ED Course  Procedures (including critical care time) Labs Review Labs Reviewed  BASIC METABOLIC PANEL - Abnormal; Notable for the following:    BUN 28 (*)    Creatinine, Ser 1.71 (*)    GFR calc non Af Amer 38 (*)    GFR calc Af Amer 44 (*)    All other components within normal limits  CBC - Abnormal; Notable for the following:    RDW 17.5 (*)    All other components within normal limits  BRAIN NATRIURETIC PEPTIDE - Abnormal; Notable for the following:    B Natriuretic Peptide 805.0 (*)    All other components within normal limits  PROTIME-INR - Abnormal; Notable for the following:    Prothrombin Time 22.5 (*)    INR 1.99 (*)    All other components within normal limits  I-STAT TROPOININ, ED    Imaging Review Dg Chest 2  View  11/02/2015  CLINICAL DATA:  Shortness of breath. EXAM: CHEST  2 VIEW COMPARISON:  September 23, 2005. FINDINGS: Stable cardiomediastinal silhouette. No pneumothorax or pleural effusion is noted. Stable emphysematous disease is noted in the upper lobes bilaterally with hyperexpansion. Stable bibasilar scarring is noted. No acute pulmonary disease is noted. Bony thorax is unremarkable. IMPRESSION: Chronic obstructive pulmonary disease. No acute cardiopulmonary abnormality seen. Electronically Signed   By: Marijo Conception, M.D.   On: 11/02/2015 12:59   I have personally reviewed and evaluated these images and lab results as part of my medical decision-making.   EKG Interpretation None     Meds given in ED:  Medications  furosemide (LASIX) injection 80 mg (not administered)    New  Prescriptions   No medications on file   Filed Vitals:   11/02/15 1415 11/02/15 1434 11/02/15 1445 11/02/15 1500  BP: 128/98  137/98 120/90  Pulse: 86  81 83  Temp:      TempSrc:      Resp: _0 Height:  _1  (1.778 m)    Weight:  93.923 kg    SpO2: 97%  92% 96%    MDM  Patrick Weiss is a 75 y.o. male with multiple medical problems including pulmonary fibrosis, coronary artery disease, CHF, A. fib and CVA comes in for evaluation of leg swelling. Patient sent by PCP for admission and diuresis. On arrival, patient is tachycardic, but this resolves at rest and during my exam. He does have significant bilateral lower extremity swelling. No hypoxia on home dose 4 L nasal cannula. Denies any chest pain or shortness of breath, hemoptysis or cough. Doubt pulmonary embolus at this time. BNP elevated at 805, INR 1.99, labs are otherwise at baseline for patient. Chest x-ray shows COPD with no acute cardiopulmonary abnormalities. Patient does appear fluid overloaded With pedal edema and at this time, believe he would benefit from medical admission and aggressive diuresis. Given 80 mg Lasix in the emergency  department. Plan to consult hospitalist. Discussed with my attending, Dr. Ralene Bathe, who also saw and evaluated the patient and agrees to plan for medical admission. Discussed with hospitalist, patient minute to medical floor. Final diagnoses:  Pedal edema        Comer Locket, PA-C 11/02/15 [place], MD 11/04/15 [phone]

## 2015-11-02 NOTE — H&P (Addendum)
TRH H&P   Patient Demographics:    Patrick Weiss, is a 75 y.o. male  MRN: [id number]   DOB - 11-14-1940  Admit Date - 11/02/2015  Outpatient Primary MD for the patient is Foye Spurling, MD  Referring MD/NP/PA: Comer Locket  Outpatient Specialists: Daneen Schick  Patient coming from: PCPs clinic  Chief Complaint  Patient presents with  . Shortness of Breath  . Leg Swelling      HPI:    Patrick Weiss  is a 75 y.o. 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 Patient says that his PCP had increased the dose of Lasix to 80 mg daily few weeks ago, and it did not help with diuresis. Patient also reports to get compression stockings that seemed to help but discontinued the therapy on his own. He denies chest pain. No shortness of breath. No nausea vomiting or diarrhea. No fever. No dysuria urgency frequency of urination.  In the ED received 80 mg IV Lasix 1.   Review of systems:    In addition to the HPI above,   No Headache, No changes with Vision or hearing, No problems swallowing food or Liquids, No Chest pain, Cough or Shortness of Breath, No Abdominal pain, No Nausea or Vommitting, Bowel movements are regular, No Blood in stool or Urine, No new skin rashes or bruises, No new joints pains-aches,  No new weakness, tingling, numbness in any extremity, Recent weight gain patient usually weighs 184 pounds and today he was 206 pounds as per patient No polyuria, polydypsia or polyphagia, No significant Mental Stressors.  A full 10 point Review of Systems was done, except as stated above, all other Review of Systems were negative.   With Past History of the following  :    Past Medical History  Diagnosis Date  . A-fib (HCC)     paroxysmal  . Hypertension   . 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 CHF (congestive heart failure) ([place])   . Pulmonary fibrosis [facility])       Past Surgical History  Procedure Laterality Date  . Back surgery  2001  . Tibia fracture surgery  1975  . Cardiac catheterization N/A 04/28/2015    Procedure: Right/Left Heart Cath and Coronary Angiography;  Surgeon: Belva Crome, MD;  Location: [facility];  Service: Cardiovascular;  Laterality: N/A;      Social History:     Social History  Substance Use Topics  . Smoking status: Former Smoker -- 1.00 packs/day for 30 years    Types: Cigarettes    Quit date: 09/09/1991  . Smokeless tobacco: Never Used  . Alcohol Use: 0.0 oz/week  0 Standard drinks or equivalent per week     Comment: glass of wine with meals     Lives - At home  Mobility - not limited    Family History :     Family History  Problem Relation Age of Onset  . Cancer Father       Home Medications:   Prior to Admission medications   Medication Sig Start Date End Date Taking? Authorizing Provider  allopurinol (ZYLOPRIM) 100 MG tablet Take 100 mg by mouth daily.   Yes Historical Provider, MD  atorvastatin (LIPITOR) 10 MG tablet Take 1 tablet (10 mg total) by mouth daily. 05/08/15  Yes Burtis Junes, NP  Calcium Carbonate-Vitamin D (CALCIUM 600+D) 600-200 MG-UNIT TABS Take 1 tablet by mouth 2 (two) times daily.   Yes Historical Provider, MD  Carboxymethylcellulose Sodium (REFRESH TEARS OP) Place 1 drop into both eyes daily as needed (dry eyes).    Yes Historical Provider, MD  fexofenadine (ALLEGRA) 180 MG tablet Take 180 mg by mouth daily as needed for allergies.    Yes Historical Provider, MD  fluticasone (FLOVENT HFA) 44 MCG/ACT inhaler Inhale 2 puffs into the lungs 2 (two) times daily. 03/11/15  Yes Brand Males,  MD  furosemide (LASIX) 40 MG tablet Take 40 mg by mouth See admin instructions. Take two (2) tablets (80 mg total) by mouth every morning and take one (1) tablet (40 mg total) by mouth every evening. 06/29/15  Yes Historical Provider, MD  NON FORMULARY Place 3 L into the nose continuous.   Yes Historical Provider, MD  Pirfenidone 267 MG CAPS Take 2 capsules by mouth 3 (three) times daily.    Yes Historical Provider, MD  Spacer/Aero-Holding Chambers (AEROCHAMBER MV) inhaler Use as instructed 04/03/15  Yes Brand Males, MD  SPIRIVA RESPIMAT 2.5 MCG/ACT AERS INHALE 2 PUFFS INTO THE LUNGS DAILY 06/15/15  Yes Brand Males, MD  valsartan (DIOVAN) 160 MG tablet Take 160 mg by mouth daily.   Yes Historical Provider, MD  warfarin (COUMADIN) 5 MG tablet Take 5 mg by mouth daily. Take 74m every day except on Sundays and wednesdays take 7.56mtablet (Splits the 23m8mn half to make total of 7.23mg62m Yes Historical Provider, MD  warfarin (COUMADIN) 5 MG tablet TAKE AS DIRECTED Patient not taking: Reported on 11/02/2015 05/11/15   [name]     Allergies:    No Known Allergies   Physical Exam:   Vitals  Blood pressure 130/89, pulse 86, temperature 97.4 F (36.3 C), temperature source Oral, resp. rate 13, height _0  (1.778 m), weight 93.923 kg (207 lb 1 oz), SpO2 92 %.   1. General African-American male in no acute distress lying in bed   2. Normal affect and insight, Not Suicidal or Homicidal, Awake Alert, Oriented X 3.  3. No F.N deficits, ALL C.Nerves Intact, Strength 5/5 all 4 extremities, Sensation intact all 4 extremities, Plantars down going.  4. Ears and Eyes appear Normal, Conjunctivae clear, PERRLA. Moist Oral Mucosa.  5. Supple Neck, No JVD, No cervical lymphadenopathy appriciated, No Carotid Bruits.  6. Symmetrical Chest wall movement, Good air movement bilaterally, CTAB.  7. RRR, No Gallops, Rubs or Murmurs, No Parasternal Heave.  8. Positive Bowel Sounds, Abdomen  Soft, No tenderness, No organomegaly appriciated,No rebound -guarding or rigidity.  9.  No Cyanosis, Normal Skin Turgor, No Skin Rash or Bruise.  10. Bilateral 3+ pitting edema noted in the lower extremity extending up to the groin, also  involving the scrotum.     Data Review:    CBC  Recent Labs Lab 11/02/15 1250  WBC 5.2  HGB 15.1  HCT 46.0  PLT 200  MCV 89.1  MCH 29.3  MCHC 32.8  RDW 17.5*   ------------------------------------------------------------------------------------------------------------------  Chemistries   Recent Labs Lab 11/02/15 1250  NA 142  K 4.3  CL 106  CO2 23  GLUCOSE 94  BUN 28*  CREATININE 1.71*  CALCIUM 9.9   ------------------------------------------------------------------------------------------------------------------ estimated creatinine clearance is 43.6 mL/min (by C-G formula based on Cr of 1.71). ------------------------------------------------------------------------------------------------------------------ No results for input(s): TSH, T4TOTAL, T3FREE, THYROIDAB in the last 72 hours.  Invalid input(s): FREET3  Coagulation profile  Recent Labs Lab 11/02/15 1437  INR 1.99*   ------------------------------------------------------------------------------------------------------------------- No results for input(s): DDIMER in the last 72 hours. -------------------------------------------------------------------------------------------------------------------  Cardiac Enzymes No results for input(s): CKMB, TROPONINI, MYOGLOBIN in the last 168 hours.  Invalid input(s): CK ------------------------------------------------------------------------------------------------------------------    Component Value Date/Time   BNP 805.0* 11/02/2015 1250     ---------------------------------------------------------------------------------------------------------------  Urinalysis No results found for: COLORURINE, APPEARANCEUR,  LABSPEC, PHURINE, GLUCOSEU, HGBUR, BILIRUBINUR, KETONESUR, PROTEINUR, UROBILINOGEN, NITRITE, LEUKOCYTESUR  ----------------------------------------------------------------------------------------------------------------   Imaging Results:    Dg Chest 2 View  11/02/2015  CLINICAL DATA:  Shortness of breath. EXAM: CHEST  2 VIEW COMPARISON:  September 23, 2005. FINDINGS: Stable cardiomediastinal silhouette. No pneumothorax or pleural effusion is noted. Stable emphysematous disease is noted in the upper lobes bilaterally with hyperexpansion. Stable bibasilar scarring is noted. No acute pulmonary disease is noted. Bony thorax is unremarkable. IMPRESSION: Chronic obstructive pulmonary disease. No acute cardiopulmonary abnormality seen. Electronically Signed   By: Marijo Conception, M.D.   On: 11/02/2015 12:59      Assessment & Plan:    Active Problems:   ATRIAL FIBRILLATION   Long term (current) use of anticoagulants   IPF (idiopathic pulmonary fibrosis) (HCC)   Pedal edema   CHF exacerbation (HCC)    1. Bilateral lower extremities edema/CHF exacerbation- bnp is 805, patient has chronic edema of the lower extremities. We'll start Lasix 80 mg IV every 12 hours from tomorrow morning. Check daily weight. Intake and output  2.Idiopathic pulmonary fibrosis/pulmonary hypertension- , no worsening of shortness of breath. Continue Pirfenidone, Flovent, Spiriva.  3. Atrial fibrillation- CHADS2Vasc score is 3, patient is on warfarin. We'll start Coumadin per pharmacy consultation  4. Chronic kidney disease stage III- patient baseline creatinine 1.6. Today creatinine is 1.7. Will follow BMP in a.m.  5. Hypertension- blood pressure controlled, will hold Valsartan at this time due to worsening of renal function.  DVT  Prophylaxis patient on full dose anticoagulation with warfarin  AM Labs Ordered, also please review Full Orders  Family Communication: No family at bedside  Code Status DO NOT  RESUSCITATE  Likely DC to  home when medically stable   Admission status: Observation  Time spent in minutes : 60 minutes   Werner Labella S M.D on 11/02/2015 at 4:26 PM  Between 7am to 7pm - Pager - [phone]. After 7pm go to [email or web address] - password [facility]  [facility] - Office  [phone]

## 2015-11-02 NOTE — ED Notes (Signed)
Dr. Carlis Abbott sent pt for EDP to evaluate need for admission for diuresis. Pt has pulmonary HTN, R sided heart failure, unable to diurese adequately at home

## 2015-11-02 NOTE — ED Notes (Addendum)
Pt c/o increasing SOB and leg swelling x 3 weeks. Pt states his PCP has increased his lasix dose but the SOB and swelling persists. Pt wears O2 4lpm at home. Denies chest pain. Pt with swelling noted to both legs.

## 2015-11-03 ENCOUNTER — Encounter (HOSPITAL_COMMUNITY): Payer: Self-pay | Admitting: General Practice

## 2015-11-03 DIAGNOSIS — I4891 Unspecified atrial fibrillation: Secondary | ICD-10-CM | POA: Diagnosis not present

## 2015-11-03 DIAGNOSIS — Z66 Do not resuscitate: Secondary | ICD-10-CM | POA: Diagnosis present

## 2015-11-03 DIAGNOSIS — I2510 Atherosclerotic heart disease of native coronary artery without angina pectoris: Secondary | ICD-10-CM | POA: Diagnosis present

## 2015-11-03 DIAGNOSIS — I272 Other secondary pulmonary hypertension: Secondary | ICD-10-CM | POA: Diagnosis present

## 2015-11-03 DIAGNOSIS — I130 Hypertensive heart and chronic kidney disease with heart failure and stage 1 through stage 4 chronic kidney disease, or unspecified chronic kidney disease: Secondary | ICD-10-CM | POA: Diagnosis present

## 2015-11-03 DIAGNOSIS — I480 Paroxysmal atrial fibrillation: Secondary | ICD-10-CM | POA: Diagnosis present

## 2015-11-03 DIAGNOSIS — I5043 Acute on chronic combined systolic (congestive) and diastolic (congestive) heart failure: Secondary | ICD-10-CM | POA: Diagnosis present

## 2015-11-03 DIAGNOSIS — J449 Chronic obstructive pulmonary disease, unspecified: Secondary | ICD-10-CM | POA: Diagnosis present

## 2015-11-03 DIAGNOSIS — Z7901 Long term (current) use of anticoagulants: Secondary | ICD-10-CM | POA: Diagnosis not present

## 2015-11-03 DIAGNOSIS — N183 Chronic kidney disease, stage 3 (moderate): Secondary | ICD-10-CM | POA: Diagnosis present

## 2015-11-03 DIAGNOSIS — Z87891 Personal history of nicotine dependence: Secondary | ICD-10-CM | POA: Diagnosis not present

## 2015-11-03 DIAGNOSIS — Z809 Family history of malignant neoplasm, unspecified: Secondary | ICD-10-CM | POA: Diagnosis not present

## 2015-11-03 DIAGNOSIS — R600 Localized edema: Secondary | ICD-10-CM | POA: Diagnosis present

## 2015-11-03 DIAGNOSIS — Z8673 Personal history of transient ischemic attack (TIA), and cerebral infarction without residual deficits: Secondary | ICD-10-CM | POA: Diagnosis not present

## 2015-11-03 DIAGNOSIS — J84112 Idiopathic pulmonary fibrosis: Secondary | ICD-10-CM | POA: Diagnosis not present

## 2015-11-03 LAB — COMPREHENSIVE METABOLIC PANEL
ALK PHOS: 55 U/L (ref 38–126)
ALT: 16 U/L — ABNORMAL LOW (ref 17–63)
ANION GAP: 9 (ref 5–15)
AST: 18 U/L (ref 15–41)
Albumin: 2.7 g/dL — ABNORMAL LOW (ref 3.5–5.0)
BILIRUBIN TOTAL: 1 mg/dL (ref 0.3–1.2)
BUN: 24 mg/dL — ABNORMAL HIGH (ref 6–20)
CALCIUM: 8.9 mg/dL (ref 8.9–10.3)
CO2: 27 mmol/L (ref 22–32)
Chloride: 105 mmol/L (ref 101–111)
Creatinine, Ser: 1.59 mg/dL — ABNORMAL HIGH (ref 0.61–1.24)
GFR, EST AFRICAN AMERICAN: 48 mL/min — AB (ref >60)
GFR, EST NON AFRICAN AMERICAN: 41 mL/min — AB (ref >60)
GLUCOSE: 78 mg/dL (ref 65–99)
POTASSIUM: 4.2 mmol/L (ref 3.5–5.1)
Sodium: 141 mmol/L (ref 135–145)
TOTAL PROTEIN: 5 g/dL — AB (ref 6.5–8.1)

## 2015-11-03 LAB — CBC
HEMATOCRIT: 40.2 % (ref 39.0–52.0)
HEMOGLOBIN: 12.9 g/dL — AB (ref 13.0–17.0)
MCH: 28.2 pg (ref 26.0–34.0)
MCHC: 32.1 g/dL (ref 30.0–36.0)
MCV: 87.8 fL (ref 78.0–100.0)
Platelets: 189 10*3/uL (ref 150–400)
RBC: 4.58 MIL/uL (ref 4.22–5.81)
RDW: 17.7 % — AB (ref 11.5–15.5)
WBC: 4.3 10*3/uL (ref 4.0–10.5)

## 2015-11-03 LAB — PROTIME-INR
INR: 2.55 — AB (ref 0.00–1.49)
PROTHROMBIN TIME: 27.1 s — AB (ref 11.6–15.2)

## 2015-11-03 MED ORDER — TIOTROPIUM BROMIDE MONOHYDRATE 18 MCG IN CAPS
18.0000 ug | ORAL_CAPSULE | Freq: Every day | RESPIRATORY_TRACT | Status: DC
  Administered 2015-11-03 – 2015-11-04 (×2): 18 ug via RESPIRATORY_TRACT
  Filled 2015-11-03: qty 5

## 2015-11-03 MED ORDER — WARFARIN - PHARMACIST DOSING INPATIENT: Freq: Every day | Status: DC

## 2015-11-03 MED ORDER — WARFARIN SODIUM 2 MG PO TABS
4.0000 mg | ORAL_TABLET | Freq: Once | ORAL | Status: AC
  Administered 2015-11-03: 4 mg via ORAL
  Filled 2015-11-03: qty 2

## 2015-11-03 NOTE — Discharge Instructions (Signed)

## 2015-11-03 NOTE — Progress Notes (Signed)
[facility]  PROGRESS NOTE  Patrick Weiss MBT:[id number] DOB: 04/29/1941 DOA: 11/02/2015 PCP: Foye Spurling, MD  Outpatient specialist:Henry Tamala Julian  Brief HPI:  75 y.o. 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 Patient says that his PCP had increased the dose of Lasix to 80 mg daily few weeks ago, and it did not help with diuresis. Patient also reports to get compression stockings that seemed to help but discontinued the therapy on his own. He denies chest pain. No shortness of breath. No nausea vomiting or diarrhea. No fever. No dysuria urgency frequency of urination. In the ED received 80 mg IV Lasix 1.  Active Problems:   ATRIAL FIBRILLATION   Long term (current) use of anticoagulants   IPF (idiopathic pulmonary fibrosis) (HCC)   Pedal edema   CHF exacerbation (HCC)   Assessment/Plan:   1. Bilateral lower extremities edema/CHF exacerbation- patient was started on Lasix 60 milligrams IV every 12 hours. Net -2.5 L since yesterday. Leg swelling has improved.  2.Idiopathic pulmonary fibrosis/pulmonary hypertension- , no worsening of shortness of breath. Continue Pirfenidone, Flovent, Spiriva.  3. Atrial fibrillation- CHADS2Vasc score is 3, patient is on warfarin. INR is 2.55 today  4. Chronic kidney disease stage III-  patient's creatinine has improved with diuresis. This morning creatinine is 1.59  5. Hypertension- blood pressure controlled, will hold Valsartan at this time due to worsening of renal function.  DVT prophylaxis: On full dose anticoagulation with warfarin Code Status: DO NOT RESUSCITATE Family Communication: No family at bedside Disposition Plan: *Pending improvement in CHF exacerbation   Consultants:  None    Procedures:  None  Antibiotics:  None   Subjective: This morning patient feels better, had good diuretic response to IV Lasix.  Objective: Filed Vitals:   11/03/15 0700 11/03/15 0853 11/03/15 0855 11/03/15 1217  BP: 123/74   108/77  Pulse: 80   86  Temp:      TempSrc:      Resp: 18   18  Height:      Weight:      SpO2: 94% 92% 92% 92%    Intake/Output Summary (Last 24 hours) at 11/03/15 1230 Last data filed at 11/03/15 1211  Gross per 24 hour  Intake    600 ml  Output   3175 ml  Net  -2575 ml   Filed Weights   11/02/15 1434 11/02/15 1800 11/03/15 0432  Weight: 93.923 kg (207 lb 1 oz) 93.214 kg (205 lb 8 oz) 92.625 kg (204 lb 3.2 oz)    Examination:  General exam: Appears calm and comfortable  Respiratory system: Clear to auscultation. Respiratory effort normal. Cardiovascular system: S1 & S2 heard, RRR. No JVD, murmurs, rubs, gallops or clicks. Bilateral 2+ pitting edema Gastrointestinal system: Abdomen is nondistended, soft and nontender. No organomegaly or masses felt. Normal bowel sounds heard. Central nervous system: Alert and oriented. No focal neurological deficits. Extremities: Symmetric 5 x 5 power. Skin: No rashes, lesions or ulcers Psychiatry: Judgement and insight appear normal. Mood & affect appropriate.    Data Reviewed: I have personally reviewed following labs and imaging studies Basic Metabolic Panel:  Recent Labs Lab 11/02/15 1250 11/03/15 0342  NA 142 141  K 4.3 4.2  CL 106 105  CO2 23 27  GLUCOSE 94 78  BUN 28* 24*  CREATININE 1.71* 1.59*  CALCIUM 9.9 8.9  Liver Function Tests:  Recent Labs Lab 11/03/15 0342  AST 18  ALT 16*  ALKPHOS 55  BILITOT 1.0  PROT 5.0*  ALBUMIN 2.7*   CBC:  Recent Labs Lab 11/02/15 1250 11/03/15 0342  WBC 5.2 4.3  HGB 15.1 12.9*  HCT 46.0 40.2  MCV 89.1 87.8  PLT 200 189     Recent Labs  11/02/15 1250  BNP 805.0*      Studies: Dg Chest 2 View  11/02/2015  CLINICAL  DATA:  Shortness of breath. EXAM: CHEST  2 VIEW COMPARISON:  September 23, 2005. FINDINGS: Stable cardiomediastinal silhouette. No pneumothorax or pleural effusion is noted. Stable emphysematous disease is noted in the upper lobes bilaterally with hyperexpansion. Stable bibasilar scarring is noted. No acute pulmonary disease is noted. Bony thorax is unremarkable. IMPRESSION: Chronic obstructive pulmonary disease. No acute cardiopulmonary abnormality seen. Electronically Signed   By: Marijo Conception, M.D.   On: 11/02/2015 12:59    Scheduled Meds: . allopurinol  100 mg Oral Daily  . atorvastatin  10 mg Oral Daily  . budesonide  0.25 mg Inhalation BID  . calcium-vitamin D  1 tablet Oral BID  . furosemide  60 mg Intravenous Q12H  . loratadine  10 mg Oral Daily  . Pirfenidone  2 capsule Oral TID  . tiotropium  18 mcg Inhalation Daily  . warfarin  4 mg Oral ONCE-1800  . Warfarin - Pharmacist Dosing Inpatient   Does not apply q1800   Continuous Infusions:      Time spent: 25 min    [facility] Pager [phone]. If 7PM-7AM, please contact night-coverage at [email or web address], Office  [phone]  password [facility] 11/03/2015, 12:30 PM

## 2015-11-03 NOTE — Progress Notes (Signed)
ANTICOAGULATION CONSULT NOTE - FOLLOW UP  Pharmacy Consult:  Coumadin Indication: atrial fibrillation  No Known Allergies  Patient Measurements: Height: _0  (177.8 cm) Weight: 204 lb 3.2 oz (92.625 kg) IBW/kg (Calculated) : 73  Vital Signs: Temp: 98.1 F (36.7 C) (04/25 0432) Temp Source: Oral (04/25 0432) BP: 123/74 mmHg (04/25 0700) Pulse Rate: 80 (04/25 0700)  Labs:  Recent Labs  11/02/15 1250 11/02/15 1437 11/03/15 0342  HGB 15.1  --  12.9*  HCT 46.0  --  40.2  PLT 200  --  189  LABPROT  --  22.5* 27.1*  INR  --  1.99* 2.55*  CREATININE 1.71*  --  1.59*    Estimated Creatinine Clearance: 46.6 mL/min (by C-G formula based on Cr of 1.59).    Assessment: 6 YOM with history of Afib to continue on Coumadin from PTA.  Patient's INR is therapeutic today.  No bleeding reported.   Goal of Therapy:  INR 2-3    Plan:  - Coumadin 24m PO today - Daily PT / INR   Darlis Wragg D. DMina Marble PharmD, BCPS Pager:  [phone]/25/2017, 8:08 AM

## 2015-11-03 NOTE — Progress Notes (Signed)
Heart Failure Navigator Consult Note  Presentation: Patrick Weiss is a 75 y.o. 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 Patient says that his PCP had increased the dose of Lasix to 80 mg daily few weeks ago, and it did not help with diuresis. Patient also reports to get compression stockings that seemed to help but discontinued the therapy on his own. He denies chest pain. No shortness of breath. No nausea vomiting or diarrhea. No fever. No dysuria urgency frequency of urination.   Past Medical History  Diagnosis Date  . A-fib (HCC)     paroxysmal  . Hypertension   . 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 CHF (congestive heart failure) ([place])   . Pulmonary fibrosis ([place])   . Shortness of breath dyspnea     Social History   Social History  . Marital Status: Married    Spouse Name: N/A  . Number of Children: N/A  . Years of Education: N/A   Occupational History  . retired    Social History Main Topics  . Smoking status: Former Smoker -- 1.00 packs/day for 30 years    Types: Cigarettes    Quit date: 09/09/1991  . Smokeless tobacco: Never Used  . Alcohol Use: 0.0 oz/week    0 Standard drinks or equivalent per week     Comment: glass of wine with meals  . Drug Use: No  . Sexual Activity:    Partners: Female   Other Topics Concern  . None   Social History Narrative    ECHO:Study Conclusions-08/29/14  - Left ventricle: The cavity size was normal. Wall thickness was increased in a pattern of mild LVH. Systolic function was mildly to moderately reduced. The estimated ejection fraction was in the range of 40% to 45%. Diffuse hypokinesis with no identifiable regional variations. Doppler  parameters are consistent with abnormal left ventricular relaxation (grade 1 diastolic dysfunction). Indeterminate ventricular filling pressure by Doppler parameters. - Aortic valve: There was trivial regurgitation. - Tricuspid valve: There was mild-moderate regurgitation directed centrally. - Pulmonary arteries: Systolic pressure was moderately increased. PA peak pressure: 61 mm Hg (S).  Transthoracic echocardiography. M-mode, complete 2D, spectral Doppler, and color Doppler. Birthdate: Patient birthdate: 01-May-1941. Age: Patient is 75 yr old. Sex: Gender: male. BMI: 25.9 kg/m^2. Blood pressure:   138/80 Patient status: Outpatient. Study date: Study date: 08/29/2014. Study time: 07:45 AM. Location: [place] [facility]  BNP    Component Value Date/Time   BNP 805.0* 11/02/2015 1250    ProBNP No results found for: PROBNP   Education Assessment and Provision:  Detailed education and instructions provided on heart failure disease management including the following:  Signs and symptoms of Heart Failure When to call the physician Importance of daily weights Low sodium diet Fluid restriction Medication management Anticipated future follow-up appointments  Patient education given on each of the above topics.  Patient acknowledges understanding and acceptance of all instructions.  I spoke with Patrick Weiss regarding his hospitalization and HF.  He tells me that he weighs himself each day.  I emphasized the importance of daily weights and how weight increases relate to signs and symptoms of HF as well as when to contact his physician.  He says he does not use table  salt and that his wife is very careful in her cooking to keep meals low sodium.  We discussed a low sodium diet and high sodium foods to avoid.  He does admit that he is a "Patrick Weiss" and adds it to many things he eats- however does not use a large amount of it at a time.  He denies any issues getting or  taking prescribed medications.  He lives with his wife in [place] and follows with [facility].      Education Materials:  "Living Better With Heart Failure" Booklet, Daily Weight Tracker Tool    High Risk Criteria for Readmission and/or Poor Patient Outcomes:   EF <30%- No -40-45% and grade 1 dias dys  2 or more admissions in 6 months- No  Difficult social situation- No  Demonstrates medication noncompliance- No    Barriers of Care:  Knowledge and compliance  Discharge Planning:   Plans to return to home with wife in [place]

## 2015-11-04 ENCOUNTER — Telehealth: Payer: Self-pay | Admitting: Internal Medicine

## 2015-11-04 ENCOUNTER — Other Ambulatory Visit: Payer: Self-pay

## 2015-11-04 DIAGNOSIS — I4891 Unspecified atrial fibrillation: Secondary | ICD-10-CM

## 2015-11-04 DIAGNOSIS — J84112 Idiopathic pulmonary fibrosis: Secondary | ICD-10-CM

## 2015-11-04 LAB — BASIC METABOLIC PANEL
ANION GAP: 8 (ref 5–15)
BUN: 25 mg/dL — AB (ref 6–20)
CHLORIDE: 103 mmol/L (ref 101–111)
CO2: 28 mmol/L (ref 22–32)
Calcium: 9.3 mg/dL (ref 8.9–10.3)
Creatinine, Ser: 1.53 mg/dL — ABNORMAL HIGH (ref 0.61–1.24)
GFR calc non Af Amer: 43 mL/min — ABNORMAL LOW (ref >60)
GFR, EST AFRICAN AMERICAN: 50 mL/min — AB (ref >60)
Glucose, Bld: 93 mg/dL (ref 65–99)
POTASSIUM: 3.9 mmol/L (ref 3.5–5.1)
SODIUM: 139 mmol/L (ref 135–145)

## 2015-11-04 LAB — PROTIME-INR
INR: 1.97 — ABNORMAL HIGH (ref 0.00–1.49)
Prothrombin Time: 22.4 seconds — ABNORMAL HIGH (ref 11.6–15.2)

## 2015-11-04 MED ORDER — WARFARIN SODIUM 5 MG PO TABS: 5.0000 mg | ORAL_TABLET | Freq: Once | ORAL | Status: DC

## 2015-11-04 NOTE — Consult Note (Signed)
   [facility] Inpatient Consult   11/04/2015  Patrick Weiss [birth year] [id number]   Patient evaluated for community based chronic disease management services with [name] as a benefit of patient's Medicare Insurance. Spoke with patient at bedside to explain [name] services. 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 Patient endorses Dr. Jeanann Lewandowsky as his primary care provider.  Consent form signed for post hospital monitoring and follow up.   Patient will receive post hospital discharge call and will be evaluated for monthly home visits for assessments and disease process education.  Left contact information and [facility] literature at bedside. Made Inpatient Case Manager aware that [name] following. Of note, [name] services does not replace or interfere with any services that are arranged by inpatient case management or social work.  For additional questions or referrals please contact:    Natividad Brood, RN BSN [facility] Liaison  [phone] business mobile phone Toll free office [phone]

## 2015-11-04 NOTE — Progress Notes (Signed)
ANTICOAGULATION CONSULT NOTE - FOLLOW UP  Pharmacy Consult:  Coumadin Indication: atrial fibrillation  No Known Allergies  Patient Measurements: Height: _0  (177.8 cm) Weight: 204 lb 6.4 oz (92.715 kg) IBW/kg (Calculated) : 73  Vital Signs: Temp: 98.5 F (36.9 C) (04/26 0636) Temp Source: Oral (04/26 0636) BP: 128/85 mmHg (04/26 0636) Pulse Rate: 105 (04/26 0636)  Labs:  Recent Labs  11/02/15 1250 11/02/15 1437 11/03/15 0342 11/04/15 0354  HGB 15.1  --  12.9*  --   HCT 46.0  --  40.2  --   PLT 200  --  189  --   LABPROT  --  22.5* 27.1* 22.4*  INR  --  1.99* 2.55* 1.97*  CREATININE 1.71*  --  1.59* 1.53*    Estimated Creatinine Clearance: 48.5 mL/min (by C-G formula based on Cr of 1.53).  Assessment: 67 YOM with history of Afib to continue on Coumadin from PTA.  Home dose: 56m daily except 7.520mon Wed and Sun.  INR this morning 1.97. Hgb 12.9, plts 189- no bleeding noted.  Goal of Therapy:  INR 2-3 Monitor platelets as per protocol  Plan:  - Coumadin 66m61mO x1 tonight  - Daily PT / INR - Follow for s/s bleeding  Quintella Mura D. Other [place], PharmD, BCPS Clinical Pharmacist Pager: [phone]/2017 11:24 AM

## 2015-11-04 NOTE — Telephone Encounter (Signed)
Called spoke with [place]. She states that she needs clarification on the patient's Esbriet. She states that the patient informed her that he is taking 2 tablets by mouth three times a day. I explained to her that I would need to send a message to MR for clarification. She voiced understanding and had no further questions.   MR please advise

## 2015-11-04 NOTE — Discharge Summary (Signed)
Physician Discharge Summary  Patrick Weiss MGQ:[id number] DOB: [birth year] DOA: 11/02/2015  PCP: Patrick Spurling, MD  Admit date: 11/02/2015 Discharge date: 11/04/2015  Time spent: > 35 minutes  Recommendations for Outpatient Follow-up:  1. Monitor INR levels 2. Consider increasing Lasix regimen pending LE edema   Discharge Diagnoses:  Active Problems:   ATRIAL FIBRILLATION   Long term (current) use of anticoagulants   IPF (idiopathic pulmonary fibrosis) (HCC)   Pedal edema   CHF exacerbation (HCC)   Discharge Condition: stable  Diet recommendation:  Low sodium, heart healthy  Filed Weights   11/02/15 1800 11/03/15 0432 11/04/15 0636  Weight: 93.214 kg (205 lb 8 oz) 92.625 kg (204 lb 3.2 oz) 92.715 kg (204 lb 6.4 oz)    History of present illness:  75 y.o. 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 Patient says that his PCP had increased the dose of Lasix to 80 mg daily few weeks ago, and it did not help with diuresis. Patient also reports to get compression stockings that seemed to help but discontinued the therapy on his own. He denies chest pain. No shortness of breath. No nausea vomiting or diarrhea. No fever. No dysuria urgency frequency of urination. Pt treated with Lasix  Hospital Course:   Bilateral lower extremities edema/CHF exacerbation- patient was started on Lasix 60 milligrams IV every 12 hours and swelling decreased on this regimen. Recommended a low sodium diet. Will continue prior to admission lasix regimen. Recommended patient f/u with pcp for further monitoring and lasix adjustment.  2.Idiopathic pulmonary fibrosis/pulmonary hypertension- , no worsening of shortness of breath. Continue Pirfenidone, Flovent, Spiriva. Supplemental oxygen at baseline  3.  Atrial fibrillation- CHADS2Vasc score is 3, patient is on warfarin. Will continue and recommend patient f/u with pcp  4. Chronic kidney disease stage III- patient's creatinine has improved with diuresis. This morning creatinine is 1.53  5. Hypertension- blood pressure controlled, will hold Valsartan at this time due to worsening of renal function.  Procedures:  None  Consultations:  None  Discharge Exam: Filed Vitals:   11/03/15 2009 11/04/15 0636  BP: 110/73 128/85  Pulse: 100 105  Temp: 97.8 F (36.6 C) 98.5 F (36.9 C)  Resp: 20 20    General: Pt in nad, alert and awake Cardiovascular: s1 and s2, no rubs Respiratory: no increased wob, no wheezes, rhales  Discharge Instructions   Discharge Instructions    AMB Referral to [place] Management    Complete by:  As directed   Reason for consult:  HF exacerbation  Expected date of contact:  1-3 days (reserved for hospital discharges)  Please assign to community nurse for transition of care calls and assess for home visits. 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 Questions please call:   Natividad Brood, RN BSN [facility] Liaison  [phone] business mobile phone Toll free office [phone]     Call MD for:  persistant nausea and vomiting    Complete by:  As directed      Call MD for:  redness, tenderness, or signs of infection (pain, swelling, redness, odor or green/yellow discharge around incision site)    Complete by:  As directed      Diet - low sodium heart healthy    Complete by:  As directed  Increase activity slowly    Complete by:  As directed           Current Discharge Medication List    CONTINUE these medications which have NOT CHANGED   Details  allopurinol (ZYLOPRIM) 100 MG tablet Take 100 mg by mouth daily.    atorvastatin (LIPITOR) 10 MG tablet Take 1 tablet (10 mg total) by mouth daily. Qty:  90 tablet, Refills: 3    Calcium Carbonate-Vitamin D (CALCIUM 600+D) 600-200 MG-UNIT TABS Take 1 tablet by mouth 2 (two) times daily.    Carboxymethylcellulose Sodium (REFRESH TEARS OP) Place 1 drop into both eyes daily as needed (dry eyes).     fexofenadine (ALLEGRA) 180 MG tablet Take 180 mg by mouth daily as needed for allergies.     fluticasone (FLOVENT HFA) 44 MCG/ACT inhaler Inhale 2 puffs into the lungs 2 (two) times daily. Qty: 1 Inhaler, Refills: 12    furosemide (LASIX) 40 MG tablet Take 40 mg by mouth See admin instructions. Take two (2) tablets (80 mg total) by mouth every morning and take one (1) tablet (40 mg total) by mouth every evening. Refills: 2    NON FORMULARY Place 3 L into the nose continuous.    Pirfenidone 267 MG CAPS Take 2 capsules by mouth 3 (three) times daily.     Spacer/Aero-Holding Chambers (AEROCHAMBER MV) inhaler Use as instructed Qty: 1 each, Refills: 0    SPIRIVA RESPIMAT 2.5 MCG/ACT AERS INHALE 2 PUFFS INTO THE LUNGS DAILY Qty: 4 g, Refills: 5    !! warfarin (COUMADIN) 5 MG tablet Take 5 mg by mouth daily. Take 637m every day except on Sundays and wednesdays take 7.556mtablet (Splits the 37m54mn half to make total of 7.37mg48m  !! warfarin (COUMADIN) 5 MG tablet TAKE AS DIRECTED Qty: 120 tablet, Refills: 1     !! - Potential duplicate medications found. Please discuss with provider.    STOP taking these medications     valsartan (DIOVAN) 160 MG tablet        No Known Allergies    The results of significant diagnostics from this hospitalization (including imaging, microbiology, ancillary and laboratory) are listed below for reference.    Significant Diagnostic Studies: Dg Chest 2 View  11/02/2015  CLINICAL DATA:  Shortness of breath. EXAM: CHEST  2 VIEW COMPARISON:  September 23, 2005. FINDINGS: Stable cardiomediastinal silhouette. No pneumothorax or pleural effusion is noted. Stable emphysematous disease is noted in the upper lobes  bilaterally with hyperexpansion. Stable bibasilar scarring is noted. No acute pulmonary disease is noted. Bony thorax is unremarkable. IMPRESSION: Chronic obstructive pulmonary disease. No acute cardiopulmonary abnormality seen. Electronically Signed   By: JameMarijo ConceptionD.   On: 11/02/2015 12:59    Microbiology: No results found for this or any previous visit (from the past 240 hour(s)).   Labs: Basic Metabolic Panel:  Recent Labs Lab 11/02/15 1250 11/03/15 0342 11/04/15 0354  NA 142 141 139  K 4.3 4.2 3.9  CL 106 105 103  CO2 _0 GLUCOSE 94 78 93  BUN 28* 24* 25*  CREATININE 1.71* 1.59* 1.53*  CALCIUM 9.9 8.9 9.3   Liver Function Tests:  Recent Labs Lab 11/03/15 0342  AST 18  ALT 16*  ALKPHOS 55  BILITOT 1.0  PROT 5.0*  ALBUMIN 2.7*   No results for input(s): LIPASE, AMYLASE in the last 168 hours. No results for input(s): AMMONIA in the last 168 hours. CBC:  Recent  Labs Lab 11/02/15 1250 11/03/15 0342  WBC 5.2 4.3  HGB 15.1 12.9*  HCT 46.0 40.2  MCV 89.1 87.8  PLT 200 189   Cardiac Enzymes: No results for input(s): CKTOTAL, CKMB, CKMBINDEX, TROPONINI in the last 168 hours. BNP: BNP (last 3 results)  Recent Labs  11/02/15 1250  BNP 805.0*    ProBNP (last 3 results) No results for input(s): PROBNP in the last 8760 hours.  CBG: No results for input(s): GLUCAP in the last 168 hours.  Signed:  Velvet Bathe MD.  [facility] 11/04/2015, 2:12 PM

## 2015-11-04 NOTE — Telephone Encounter (Signed)
He was having side effects at higher dose. So he went to lower dose 2 tab tid and he is tolerating it fine.

## 2015-11-05 ENCOUNTER — Ambulatory Visit (INDEPENDENT_AMBULATORY_CARE_PROVIDER_SITE_OTHER): Payer: Medicare Other | Admitting: Pharmacist

## 2015-11-05 DIAGNOSIS — Z7901 Long term (current) use of anticoagulants: Secondary | ICD-10-CM | POA: Diagnosis not present

## 2015-11-05 DIAGNOSIS — I6350 Cerebral infarction due to unspecified occlusion or stenosis of unspecified cerebral artery: Secondary | ICD-10-CM | POA: Diagnosis not present

## 2015-11-05 DIAGNOSIS — I4891 Unspecified atrial fibrillation: Secondary | ICD-10-CM | POA: Diagnosis not present

## 2015-11-05 LAB — POCT INR: INR: 1.7

## 2015-11-05 NOTE — Telephone Encounter (Signed)
Spoke with Melissa at [employer] - clarification given for Esbriet dose per MR below.  Verbal taken for 90-day supply x 3 refills.  Nothing further needed.

## 2015-11-06 ENCOUNTER — Other Ambulatory Visit: Payer: Self-pay | Admitting: *Deleted

## 2015-11-06 NOTE — Patient Outreach (Signed)
Referral received from hospital liaison to initiate transition of care program once discharged.  Member admitted on 8/67 for complications with congestive heart failure, pedal edema, and atrial fibrillation.  He was discharge don 4/26.  According to chart, he also has history of hypertension, coronary artery disease, CVA, and COPD.    Call placed to member, no answer, HIPPA compliant voice message left.  Will await call back.  If no call back, will make second attempt to contact on Monday.  Valente David, BSN, [place] Management  [facility] Care Manager [phone]

## 2015-11-09 ENCOUNTER — Other Ambulatory Visit: Payer: Self-pay | Admitting: *Deleted

## 2015-11-09 NOTE — Patient Outreach (Signed)
Voice message received back from member after initial transition of care call on Friday.  Second attempt made to contact member, no answer.  HIPPA compliant voice message left.  Will await call back.  Will make 3rd attempt to contact later this week.  Valente David, BSN, [place] Management  [facility] Care Manager [phone]

## 2015-11-10 ENCOUNTER — Other Ambulatory Visit: Payer: Self-pay | Admitting: *Deleted

## 2015-11-10 NOTE — Patient Outreach (Signed)
Call placed to member again to start transition of care program.  He reports that he is doing "good."  He state that he saw his primary physician today, Dr. Carlis Abbott, and that although he is still experiencing some swelling, he is improving.  Signs/symptoms of heart failure discussed, member verbalized understanding, stating that he has previously received education.  He state that he does have a scale, but was told by his PCP that he did not have to weigh daily.  Encouraged to do so according to heart failure education and management guidelines.  He verbalized understanding.  He report that he has been taking all of his medications as prescribed, and that he already has follow up appointments scheduled this month for cardiology, pulmonology, and another follow up with his PCP.  He state that he has wife provide transportation, denies any social work needs at this time.    Member agrees to receive weekly calls for transition of care, initial home visit scheduled for next week.  Contact information provided, encouraged to contact with concerns/questions.  Denies any at this time.  Valente David, BSN, [place] Management  [facility] Care Manager [phone]

## 2015-11-16 ENCOUNTER — Encounter: Payer: Self-pay | Admitting: *Deleted

## 2015-11-19 ENCOUNTER — Encounter: Payer: Self-pay | Admitting: *Deleted

## 2015-11-19 ENCOUNTER — Other Ambulatory Visit: Payer: Self-pay | Admitting: *Deleted

## 2015-11-19 NOTE — Patient Outreach (Addendum)
[facility]) Care Management   11/19/2015  Patrick Weiss Jul 13, 1940 [id number]  Patrick Weiss is an 75 y.o. male  Subjective:   Member states he doing "alright.  Some days are better than others.  But today is a good day."  He reports taking all medications as prescribed, denies questions.  He currently denies any shortness of breath, on continuous home O2 for pulmonary fibrosis, Continues to have lower extremity swelling although he state it has improved.  He state that he is able to perform all of his ADLs, although he has to take his time due to some shortness of breath during exertion occasionally.  He does state that he will benefit from a shower chair.  He reports having follow up appointments with his cardiologist, pulmonologist, and PCP over the next few weeks.  Objective:   Review of Systems  Constitutional: Negative.   HENT: Negative.   Eyes: Negative.   Respiratory: Positive for cough.        In the morning   Cardiovascular: Positive for leg swelling.  Gastrointestinal: Negative.   Genitourinary: Negative.   Musculoskeletal: Negative.   Skin: Negative.   Neurological: Negative.   Endo/Heme/Allergies: Negative.   Psychiatric/Behavioral: Negative.     Physical Exam  Constitutional: He is oriented to person, place, and time. He appears well-developed and well-nourished.  Neck: Normal range of motion.  Cardiovascular: Normal rate.   Atrial Fibrillation   Respiratory: Effort normal and breath sounds normal.  GI: Soft. Bowel sounds are normal.  Genitourinary: Rectum normal and penis normal.  Musculoskeletal: Normal range of motion.  Neurological: He is alert and oriented to person, place, and time.  Skin: Skin is warm and dry.    BP 102/60 mmHg  Pulse 91  Resp 18  Ht 1.778 m (_0 )  Wt 202 lb (91.627 kg)  BMI 28.98 kg/m2  SpO2 94%   Encounter Medications:   Outpatient Encounter Prescriptions as of 11/19/2015  Medication Sig Note  .  allopurinol (ZYLOPRIM) 100 MG tablet Take 100 mg by mouth daily.   Marland Kitchen atorvastatin (LIPITOR) 10 MG tablet Take 1 tablet (10 mg total) by mouth daily.   . Calcium Carbonate-Vitamin D (CALCIUM 600+D) 600-200 MG-UNIT TABS Take 1 tablet by mouth 2 (two) times daily.   . Carboxymethylcellulose Sodium (REFRESH TEARS OP) Place 1 drop into both eyes daily as needed (dry eyes).    . fexofenadine (ALLEGRA) 180 MG tablet Take 180 mg by mouth daily as needed for allergies.    . fluticasone (FLOVENT HFA) 44 MCG/ACT inhaler Inhale 2 puffs into the lungs 2 (two) times daily.   . furosemide (LASIX) 80 MG tablet Take 80 mg by mouth daily. 11/19/2015: Reports taking 65m every am, 40 mg every pm   . NON FORMULARY Place 3 L into the nose continuous.   . Pirfenidone 267 MG CAPS Take 2 capsules by mouth 3 (three) times daily.    .Marland KitchenSpacer/Aero-Holding Chambers (AEROCHAMBER MV) inhaler Use as instructed   . SPIRIVA RESPIMAT 2.5 MCG/ACT AERS INHALE 2 PUFFS INTO THE LUNGS DAILY   . valsartan (DIOVAN) 160 MG tablet Take 160 mg by mouth daily.   .Marland Kitchenwarfarin (COUMADIN) 5 MG tablet TAKE AS DIRECTED    No facility-administered encounter medications on file as of 11/19/2015.   Patient was recently discharged from hospital and all medications have been reviewed.  Functional Status:   In your present state of health, do you have any difficulty performing the following activities:  11/19/2015 11/03/2015  Hearing? N N  Vision? N N  Difficulty concentrating or making decisions? Y N  Walking or climbing stairs? Y N  Dressing or bathing? N N  Doing errands, shopping? N N  Preparing Food and eating ? N -  Using the Toilet? N -  In the past six months, have you accidently leaked urine? N -  Do you have problems with loss of bowel control? N -  Managing your Medications? N -  Managing your Finances? N -  Housekeeping or managing your Housekeeping? N -    Fall/Depression Screening:    PHQ 2/9 Scores 11/19/2015 06/09/2015  01/26/2015  PHQ - 2 Score 0 1 1   Fall Risk  11/19/2015 01/26/2015  Falls in the past year? No No    Assessment:    Met with member at scheduled time, wife present to assist with history.  Wife inquires about the level of activity for the member.  She state that she encourages him to be more active, but want to make sure of what his restrictions should be.  Discussed proper exercise, also advised member/wife that the member himself will be able to increase/limit his activity as he is abe to tolerate.  She state that he was in cardiopulmonary rehab before, and was discharged after graduating the program.  She expresses interest in restarting if possible, encouraged to discuss during pulmonary & cardiology office visits.   Discussed heart failure management, including diet, daily weights, and zones.  Member does have heart failure packet, able to verbalize understanding of signs/symptoms and when to notify the physician.  Provided with [facility] calendar for daily recordings.  Both member and wife deny any further concerns.  Contact information provided, encouraged to contact with questions.  Plan:   Will continue with weekly transition of care calls next week. Will contact PCP office to request prescription for shower chair.  THN CM Care Plan Problem One        Most Recent Value   Care Plan Problem One  Recent hospitalization   Role Documenting the Problem One  Care Management Coordinator   Care Plan for Problem One  Active   THN Long Term Goal (31-90 days)  Member will not be readmitted to hospital within the next 31 days   THN Long Term Goal Start Date  11/10/15   Interventions for Problem One Long Term Goal  Discussed with member the importance of following discharge instructions, including follow up appointments, medications, diet, to decrease the risk of readmission   THN CM Short Term Goal #1 (0-30 days)  Member will have follow up appointment within the next 4 weeks   THN CM Short Term Goal  #1 Start Date  11/10/15   [facility] CM Short Term Goal #1 Met Date  11/19/15   Interventions for Short Term Goal #1  Follow up appointment confirmed, transportation confirmed   THN CM Short Term Goal #2 (0-30 days)  Member will take all medications as prescribed over the next 4 weeks   THN CM Short Term Goal #2 Start Date  11/10/15   Interventions for Short Term Goal #2  Medications reviewed, discussed importance of taking all medications as prescribed   THN CM Short Term Goal #3 (0-30 days)  Member will monitor fluid status carefully, with daily weights, over the next 4 weeks   THN CM Short Term Goal #3 Start Date  11/10/15   Interventions for Short Tern Goal #3  Discussed with member the importance  of following discharge instructions, including follow up appointments, medications, diet, and daily weights, to decrease the risk of readmission     Valente David, BSN, [place] Manager [phone]

## 2015-11-20 ENCOUNTER — Ambulatory Visit (INDEPENDENT_AMBULATORY_CARE_PROVIDER_SITE_OTHER): Payer: Medicare Other | Admitting: *Deleted

## 2015-11-20 DIAGNOSIS — Z7901 Long term (current) use of anticoagulants: Secondary | ICD-10-CM

## 2015-11-20 DIAGNOSIS — I4891 Unspecified atrial fibrillation: Secondary | ICD-10-CM | POA: Diagnosis not present

## 2015-11-20 DIAGNOSIS — I6350 Cerebral infarction due to unspecified occlusion or stenosis of unspecified cerebral artery: Secondary | ICD-10-CM | POA: Diagnosis not present

## 2015-11-20 LAB — POCT INR: INR: 2.6

## 2015-11-26 ENCOUNTER — Ambulatory Visit: Payer: Self-pay | Admitting: *Deleted

## 2015-11-27 ENCOUNTER — Other Ambulatory Visit: Payer: Self-pay | Admitting: *Deleted

## 2015-11-27 NOTE — Patient Outreach (Addendum)
Call received back from member, stating that he is doing "good."  He reports that his is currently out running errands, and state that he has been able to increase his activity, slowly.  He states that some days are better than others, assured that this is normal, and to increase activity as tolerated, remembering to rest when needed.  He verbalizes understanding.  He reports continued compliance with medications and daily weights, stating his weight for today is 203 pounds, which is consistent with the past several weeks.  He voices frustration about receiving daily calls regarding his condition (EMMI heart failure calls) and wish them to stop.  He state that he is aware of who to contact (this care manager and/or his physician) should he have any issues.  Will notify care management assistant and have her remove member from the call list.  Made aware that this care manager has contacted PCP office to request prescription for shower chair.  He has a follow up appointment next week and will obtain at that time.  He also has an appointment with the pulmonologist next week, transportation confirmed.  Encouraged to contact with questions/concerns.  Will continue with weekly calls next week.  Valente David, BSN, [place] Management  [facility] Care Manager [phone]

## 2015-11-27 NOTE — Patient Outreach (Signed)
Weekly transition of care call placed to member, no answer.  HIPPA compliant voice message left, will await call back.  Will continue with calls next week.  Call also placed to PCP office to obtain prescription for shower chair as member requested during initial home visit last week.  Valente David, BSN, [place] Management  [place] Care Manager [phone]

## 2015-12-03 ENCOUNTER — Other Ambulatory Visit (INDEPENDENT_AMBULATORY_CARE_PROVIDER_SITE_OTHER): Payer: Medicare Other

## 2015-12-03 ENCOUNTER — Encounter: Payer: Self-pay | Admitting: Internal Medicine

## 2015-12-03 ENCOUNTER — Ambulatory Visit (INDEPENDENT_AMBULATORY_CARE_PROVIDER_SITE_OTHER): Payer: Medicare Other | Admitting: Internal Medicine

## 2015-12-03 ENCOUNTER — Ambulatory Visit (INDEPENDENT_AMBULATORY_CARE_PROVIDER_SITE_OTHER): Payer: Medicare Other | Admitting: *Deleted

## 2015-12-03 VITALS — BP 122/82 | Ht 70.0 in | Wt 208.0 lb

## 2015-12-03 DIAGNOSIS — Z7901 Long term (current) use of anticoagulants: Secondary | ICD-10-CM

## 2015-12-03 DIAGNOSIS — I6350 Cerebral infarction due to unspecified occlusion or stenosis of unspecified cerebral artery: Secondary | ICD-10-CM

## 2015-12-03 DIAGNOSIS — J84112 Idiopathic pulmonary fibrosis: Secondary | ICD-10-CM

## 2015-12-03 DIAGNOSIS — R600 Localized edema: Secondary | ICD-10-CM | POA: Diagnosis not present

## 2015-12-03 DIAGNOSIS — J9611 Chronic respiratory failure with hypoxia: Secondary | ICD-10-CM | POA: Diagnosis not present

## 2015-12-03 DIAGNOSIS — I4891 Unspecified atrial fibrillation: Secondary | ICD-10-CM | POA: Diagnosis not present

## 2015-12-03 LAB — BASIC METABOLIC PANEL
BUN: 29 mg/dL — ABNORMAL HIGH (ref 6–23)
CHLORIDE: 105 meq/L (ref 96–112)
CO2: 31 mEq/L (ref 19–32)
Calcium: 9.9 mg/dL (ref 8.4–10.5)
Creatinine, Ser: 1.46 mg/dL (ref 0.40–1.50)
GFR: 60.68 mL/min (ref >60.00)
Glucose, Bld: 91 mg/dL (ref 70–99)
POTASSIUM: 4.5 meq/L (ref 3.5–5.1)
Sodium: 141 mEq/L (ref 135–145)

## 2015-12-03 LAB — HEPATIC FUNCTION PANEL
ALK PHOS: 76 U/L (ref 39–117)
ALT: 18 U/L (ref 0–53)
AST: 20 U/L (ref 0–37)
Albumin: 4.1 g/dL (ref 3.5–5.2)
BILIRUBIN DIRECT: 0.4 mg/dL — AB (ref 0.0–0.3)
BILIRUBIN TOTAL: 1.1 mg/dL (ref 0.2–1.2)
Total Protein: 6.9 g/dL (ref 6.0–8.3)

## 2015-12-03 LAB — POCT INR: INR: 3

## 2015-12-03 NOTE — Progress Notes (Signed)
Subjective:     Patient ID: Patrick Weiss, male   DOB: Apr 14, 1941, 75 y.o.   MRN: [id number]  HPI   OV 09/07/2015  Chief Complaint  Patient presents with  . Follow-up    Pt does not feel that Esbriet is helping much. Pt uses 2.5-4Liters O2 per/min.    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 This is associated with cor pulmonale and also chronic systolic heart failure ejection fraction 45% in February 2016. He is on anti-fibrotic therapy Esbriet since approximately November 2016.   He presents for follow-up with his wife. Overall he feels that the drug might not be helping him. He understands that the anti-fibrotic therapies for prevention and is not necessarily designed to make him feel better. Wife reports that he had a low appetite initially after starting the drug but since then his appetite is picked back up to normal. However he is having fatigue that is significantly worse after starting the drug. It is moderate in intensity. His oxygen level uses around the same. He is also reporting significant bilateral pedal edema for which she has seen Dr. Pernell Dupre. Most recent chemistries reveal a BNP of 750 and a creatinine of 1.62 mg percent. The creatinine is slightly higher than December 2016 when it was only 1.5 mg percent. His last liver function test was in December 2016 and it was normal. He is due for repeat liver function test today. Off note, he is not ideal candidate for Ofev due to anticoagulation  He is also reporting changes in his sleep pattern and is wondering if it's because of IPF anti-fibrotic therapy   OV 12/03/2015  Chief Complaint  Patient presents with  . Follow-up    Pt c/o continued SOB. He is on 1L O2 with POC when ambulating and 4L O2 via POC when at home. Pt's O2 in room was 67 on POC @ 1L. Pt also c/o cough with mostly clear and occasional wheeze/chest tightness.  Pt denies CP.      Follow-up idiopathic pulmonary fibrosis on Pirfenidone (Esbriet) 2 tablets 3 times a day  He had issues with higher dose of Pirfenidone (Esbriet) but he is tolerating the lower dose 2 tablets 3 times daily without problems. He did get admitted in April 2017 with congestive heart failure according to his history. He did have a chest x-ray with that I reviewed the results. He also had blood work that showed mild renal insufficiency but normal liver function tests. I visualize these results myself. At this point in time he feels overall he is stable. He is trying to get into an [facility] program at [facility]. He is more sedentary. He is using 1 L of oxygen at rest with 4 L at exertion. Today at rest on room air for 20 minutes his pulse ox of 82%. When he walks from the waiting area to our office room on 1 L oxygen he dropped a 67%. He says his most recent admission he had significant pedal edema and he was diuresed. He still has significant pedal edema.f note he is wondering why he should apply sunscreen with IPF. He says that he never told him that. Apparently the booklets from [place] tell him that.     has a past medical history of A-fib ([place]); Hypertension; Stroke [facility]); CHF (congestive heart failure) ([place]); Pulmonary fibrosis ([place]); and Shortness of breath dyspnea.   reports that he quit smoking about 24 years  ago. His smoking use included Cigarettes. He has a 30 pack-year smoking history. He has never used smokeless tobacco.  Past Surgical History  Procedure Laterality Date  . Back surgery  2001  . Tibia fracture surgery  1975  . Cardiac catheterization N/A 04/28/2015    Procedure: Right/Left Heart Cath and Coronary Angiography;  Surgeon: Belva Crome, MD;  Location: [place] [facility];  Service: Cardiovascular;  Laterality: N/A;    No Known Allergies  Immunization History  Administered Date(s) Administered  . Influenza Split 03/11/2014  . Influenza,inj,Quad PF,36+ Mos  03/16/2015    Family History  Problem Relation Age of Onset  . Cancer Father      Current outpatient prescriptions:  .  allopurinol (ZYLOPRIM) 100 MG tablet, Take 100 mg by mouth daily., Disp: , Rfl:  .  atorvastatin (LIPITOR) 10 MG tablet, Take 1 tablet (10 mg total) by mouth daily., Disp: 90 tablet, Rfl: 3 .  Calcium Carbonate-Vitamin D (CALCIUM 600+D) 600-200 MG-UNIT TABS, Take 1 tablet by mouth 2 (two) times daily., Disp: , Rfl:  .  Carboxymethylcellulose Sodium (REFRESH TEARS OP), Place 1 drop into both eyes daily as needed (dry eyes). , Disp: , Rfl:  .  fexofenadine (ALLEGRA) 180 MG tablet, Take 180 mg by mouth daily as needed for allergies. , Disp: , Rfl:  .  fluticasone (FLOVENT HFA) 44 MCG/ACT inhaler, Inhale 2 puffs into the lungs 2 (two) times daily., Disp: 1 Inhaler, Rfl: 12 .  furosemide (LASIX) 80 MG tablet, Take 80 mg by mouth daily., Disp: , Rfl:  .  NON FORMULARY, Place 3 L into the nose continuous., Disp: , Rfl:  .  Pirfenidone 267 MG CAPS, Take 2 capsules by mouth 3 (three) times daily. , Disp: , Rfl:  .  Spacer/Aero-Holding Chambers (AEROCHAMBER MV) inhaler, Use as instructed, Disp: 1 each, Rfl: 0 .  SPIRIVA RESPIMAT 2.5 MCG/ACT AERS, INHALE 2 PUFFS INTO THE LUNGS DAILY, Disp: 4 g, Rfl: 5 .  valsartan (DIOVAN) 160 MG tablet, Take 160 mg by mouth daily., Disp: , Rfl:  .  warfarin (COUMADIN) 5 MG tablet, TAKE AS DIRECTED, Disp: 120 tablet, Rfl: 1    Review of Systems     Objective:   Physical Exam  Constitutional: He is oriented to person, place, and time. He appears well-developed and well-nourished. No distress.  HENT:  Head: Normocephalic and atraumatic.  Right Ear: External ear normal.  Left Ear: External ear normal.  Mouth/Throat: Oropharynx is clear and moist. No oropharyngeal exudate.  Eyes: Conjunctivae and EOM are normal. Pupils are equal, round, and reactive to light. Right eye exhibits no discharge. Left eye exhibits no discharge. No scleral icterus.   Neck: Normal range of motion. Neck supple. No JVD present. No tracheal deviation present. No thyromegaly present.  Cardiovascular: Normal rate, regular rhythm and intact distal pulses.  Exam reveals no gallop and no friction rub.   No murmur heard. Pulmonary/Chest: Effort normal. No respiratory distress. He has no wheezes. He has rales. He exhibits no tenderness.  Abdominal: Soft. Bowel sounds are normal. He exhibits no distension and no mass. There is no tenderness. There is no rebound and no guarding.  Musculoskeletal: Normal range of motion. He exhibits edema. He exhibits no tenderness.  Lymphadenopathy:    He has no cervical adenopathy.  Neurological: He is alert and oriented to person, place, and time. He has normal reflexes. No cranial nerve deficit. Coordination normal.  Skin: Skin is warm and dry. No rash noted. He  is not diaphoretic. No erythema. No pallor.  Psychiatric: He has a normal mood and affect. His behavior is normal. Judgment and thought content normal.  Nursing note and vitals reviewed.   Filed Vitals:   12/03/15 1536 12/03/15 1546 12/03/15 1547  BP: 122/82    Height: 5' 10" (1.778 m)    Weight: 208 lb (94.348 kg)    SpO2: 67% 97% 83%        Assessment:       ICD-9-CM ICD-10-CM   1. IPF (idiopathic pulmonary fibrosis) (HCC) 516.31 J84.112   2. Chronic respiratory failure with hypoxia (HCC) 518.83 J96.11    799.02    3. Pedal edema 782.3 R60.0        Plan:       IPF might be worse  Plan Do bmet and LFT 12/03/2015 Continue esbriet at lower dose of 2 tab tid Continue o2 1L at rest and 4L with exertion You might qualify for future research trial looking at o2 capacity; will let you know if we have a study like that Do PFt  In 2 months Stay active to extent possible APPLY SUNSCREEN oN ESBRIET - rationale explained  FOllowu  afteer PFT in 2 months      > 50% of this > 25 min visit spent in face to face counseling or coordination of care    ,Dr.  Brand Males, M.D., [facility].C.P Pulmonary and Critical Care Medicine Staff Physician [place] [facility] Pager: [phone], If no answer or between  15:00h - 7:00h: call [phone]  12/03/2015 3:53 PM

## 2015-12-03 NOTE — Patient Instructions (Signed)
ICD-9-CM ICD-10-CM   1. IPF (idiopathic pulmonary fibrosis) (HCC) 516.31 J84.112   2. Chronic respiratory failure with hypoxia (HCC) 518.83 J96.11    799.02    3. Pedal edema 782.3 R60.0     IPF might be worse  Plan Do bmet and LFT 12/03/2015 Continue esbriet at lower dose of 2 tab tid Continue o2 1L at rest and 4L with exertion You might qualify for future research trial looking at o2 capacity; will let you know if we have a study like that Do PFt  In 2 months Stay active to extent possible APPLY SUNSCREEN oN ESBRIET  FOllowu  afteer PFT in 2 months

## 2015-12-04 ENCOUNTER — Other Ambulatory Visit: Payer: Self-pay | Admitting: *Deleted

## 2015-12-04 NOTE — Patient Outreach (Signed)
Weekly transition of care call placed to member, no answer, HIPPA compliant voice message left.  Will await call back, if no call back will follow up next week.  Valente David, BSN, [place] Management  [facility] Care Manager [phone]

## 2015-12-04 NOTE — Patient Outreach (Signed)
Call received back from member.  He reports he is "doing well."  He state that he has continues to increase his activity as tolerated, and is aware of when to rest if needed.  He reports a follow up visit with the pulmonologist, and state he has continued to lose weight, this time he is down another 7 pounds.  He has continued to monitor his weights and fluid status, and reports compliance with medications, no questions.  He reports that he did receive the prescription for shower chair, stating he has obtained one.    Member is made aware that his transition of care program is complete.  He states that he does not feel he has any further needs for this community care Freight forward[employer].  Discussed the opportunity to have a health coach involved in his care for ongoing education and disease management.  He state he has another follow up appointment next week with his cardiologist and wish to wait until that is complete to make his final decision.  Will follow up next week.  Advised to contact this care manager with any concerns.  Patrick Weiss, BSN, [place] Management  [facility] Care Manager [phone]

## 2015-12-08 ENCOUNTER — Telehealth: Payer: Self-pay | Admitting: Internal Medicine

## 2015-12-08 DIAGNOSIS — J439 Emphysema, unspecified: Secondary | ICD-10-CM

## 2015-12-08 DIAGNOSIS — J849 Interstitial pulmonary disease, unspecified: Secondary | ICD-10-CM

## 2015-12-08 DIAGNOSIS — J9611 Chronic respiratory failure with hypoxia: Secondary | ICD-10-CM

## 2015-12-08 NOTE — Telephone Encounter (Signed)
Notes Recorded by Len Blalock, CMA on 12/08/2015 at 3:01 PM lmtcb X1 for pt to relay results/recs.  Notes Recorded by Brand Males, MD on 12/04/2015 at 2:06 PM Lab       12/03/15   12/03/15           1340     1601     AST      --     20      ALT      --     18      ALKPHOS    --     76      BILITOT    --     1.1      PROT     --     6.9      ALBUMIN    --     4.1      INR     3.0      --        Lab       12/03/15           1601     NA      141      K      4.5      CL      105      CO2     31      GLUCOSE   91      BUN     29*      CREATININE  1.46     CALCIUM   9.9        Stable LFT and c hemistry  Pt aware of results; no further questions or concerns at this time.

## 2015-12-08 NOTE — Addendum Note (Signed)
Addended by: Lorane Gell on: 12/08/2015 05:31 PM   Modules accepted: Orders

## 2015-12-08 NOTE — Telephone Encounter (Signed)
Pt then states he was told last week at visit that we would check with [facility] to see about different POC since 4lpm in current POC runs out about 4 hours-wants to see what else is available. Order has been placed to [place].

## 2015-12-10 ENCOUNTER — Ambulatory Visit (INDEPENDENT_AMBULATORY_CARE_PROVIDER_SITE_OTHER): Payer: Medicare Other | Admitting: Interventional Cardiology

## 2015-12-10 ENCOUNTER — Encounter: Payer: Self-pay | Admitting: Interventional Cardiology

## 2015-12-10 VITALS — BP 110/80 | HR 92 | Ht 70.0 in | Wt 209.0 lb

## 2015-12-10 DIAGNOSIS — I480 Paroxysmal atrial fibrillation: Secondary | ICD-10-CM

## 2015-12-10 DIAGNOSIS — I10 Essential (primary) hypertension: Secondary | ICD-10-CM

## 2015-12-10 DIAGNOSIS — I6350 Cerebral infarction due to unspecified occlusion or stenosis of unspecified cerebral artery: Secondary | ICD-10-CM | POA: Diagnosis not present

## 2015-12-10 DIAGNOSIS — J84112 Idiopathic pulmonary fibrosis: Secondary | ICD-10-CM | POA: Diagnosis not present

## 2015-12-10 DIAGNOSIS — I2510 Atherosclerotic heart disease of native coronary artery without angina pectoris: Secondary | ICD-10-CM | POA: Diagnosis not present

## 2015-12-10 DIAGNOSIS — Z7901 Long term (current) use of anticoagulants: Secondary | ICD-10-CM

## 2015-12-10 MED ORDER — SPIRONOLACTONE 25 MG PO TABS: 25.0000 mg | ORAL_TABLET | Freq: Every day | ORAL | Status: DC

## 2015-12-10 NOTE — Progress Notes (Signed)
Cardiology Office Note    Date:  12/10/2015   ID:  Patrick Weiss, DOB June 29, 1941, MRN [id number]  PCP:  Foye Spurling, MD  Cardiologist: Sinclair Grooms, MD   Chief Complaint  Patient presents with  . Atrial Fibrillation    History of Present Illness:  Patrick Weiss is a 75 y.o. male follow-up of atrial fibrillation (paroxysmal), hypertension, CVA, diastolic heart failure, pulmonary fibrosis with right heart failure and chronic hypoxia area  The patient is on high flow oxygen. 4 L when ambulatory and 2 L at rest. His therapy for pulmonary fibrosis is Esbriet. He was in the hospital in April because of progressive volume overload. Neither cardiology or pulmonary saw him while hospitalized. The hospitalist service assumed care. He sees Dr. Chase Caller from the standpoint of pulmonary fibrosis. He denies chest pain. No new neurological complaints. He is concerned by excessive/severe lower extremity edema. On warfarin he has not noted any significant blood in his urine or stool.  He denies hemoptysis.  Past Medical History  Diagnosis Date  . A-fib (HCC)     paroxysmal  . Hypertension   . 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 CHF (congestive heart failure) ([place])   . Pulmonary fibrosis ([place])   . Shortness of breath dyspnea     Past Surgical History  Procedure Laterality Date  . Back surgery  2001  . Tibia fracture surgery  1975  . Cardiac catheterization N/A 04/28/2015    Procedure: Right/Left Heart Cath and Coronary Angiography;  Surgeon: Belva Crome, MD;  Location: [place] [facility];  Service: Cardiovascular;  Laterality: N/A;    Current Medications: Outpatient Prescriptions Prior to Visit  Medication Sig Dispense Refill  . allopurinol (ZYLOPRIM) 100 MG tablet Take 100 mg by mouth daily.    Marland Kitchen atorvastatin (LIPITOR) 10 MG tablet Take 1 tablet (10 mg total) by mouth daily. 90 tablet 3  . Calcium  Carbonate-Vitamin D (CALCIUM 600+D) 600-200 MG-UNIT TABS Take 1 tablet by mouth 2 (two) times daily.    . Carboxymethylcellulose Sodium (REFRESH TEARS OP) Place 1 drop into both eyes daily as needed (dry eyes).     . fexofenadine (ALLEGRA) 180 MG tablet Take 180 mg by mouth daily as needed for allergies.     . fluticasone (FLOVENT HFA) 44 MCG/ACT inhaler Inhale 2 puffs into the lungs 2 (two) times daily. 1 Inhaler 12  . furosemide (LASIX) 40 MG tablet Take 1 tablet by mouth at bedtime.  3  . furosemide (LASIX) 80 MG tablet Take 80 mg by mouth daily.    . NON FORMULARY Place 3 L into the nose continuous.    . Pirfenidone 267 MG CAPS Take 2 capsules by mouth 3 (three) times daily.     Marland Kitchen Spacer/Aero-Holding Chambers (AEROCHAMBER MV) inhaler Use as instructed 1 each 0  . SPIRIVA RESPIMAT 2.5 MCG/ACT AERS INHALE 2 PUFFS INTO THE LUNGS DAILY 4 g 5  . valsartan (DIOVAN) 160 MG tablet Take 160 mg by mouth daily.    Marland Kitchen warfarin (COUMADIN) 5 MG tablet TAKE AS DIRECTED 120 tablet 1   No facility-administered medications prior to visit.     Allergies:   Review of patient's allergies indicates no known allergies.   Social History   Social History  . Marital Status: Married    Spouse Name: N/A  . Number of Children: N/A  .  Years of Education: N/A   Occupational History  . retired    Social History Main Topics  . Smoking status: Former Smoker -- 1.00 packs/day for 30 years    Types: Cigarettes    Quit date: 09/09/1991  . Smokeless tobacco: Never Used  . Alcohol Use: 0.0 oz/week    0 Standard drinks or equivalent per week     Comment: glass of wine with meals  . Drug Use: No  . Sexual Activity:    Partners: Female   Other Topics Concern  . None   Social History Narrative     Family History:  The patient's family history includes Cancer in his father.   ROS:   Please see the history of present illness.    Legs are heavy and walking is difficult due to severe edema. Cough each  morning. Dyspnea with exertion. Occasional wheezing.All other systems reviewed and are negative.   PHYSICAL EXAM:   VS:  BP 110/80 mmHg  Pulse 92  Ht _0  (1.778 m)  Wt 209 lb (94.802 kg)  BMI 29.99 kg/m2   GEN: Well nourished, well developed, in no acute distress HEENT: normal Neck: no JVD, carotid bruits, or masses Cardiac: Irregularly irregularRR; no murmurs, rubs, or gallops,no edema  Respiratory:  clear to auscultation bilaterally, normal work of breathing GI: soft, nontender, nondistended, + BS MS: no deformity or atrophy Skin: warm and dry, no rash Neuro:  Alert and Oriented x 3, Strength and sensation are intact Psych: euthymic mood, full affect  Wt Readings from Last 3 Encounters:  12/10/15 209 lb (94.802 kg)  12/03/15 208 lb (94.348 kg)  11/19/15 202 lb (91.627 kg)      Studies/Labs Reviewed:   EKG:  EKG Reveals sinus rhythm, PACs, biatrial abnormality, frequent PACs, question wandering atrial pacemaker rhythm.. There is significant right axis deviation and findings compatible with either ventricular hypertrophy. 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  Current Labs: 09/07/2015: Magnesium 2.3 11/02/2015: B Natriuretic Peptide 805.0* 11/03/2015: Hemoglobin 12.9*; Platelets 189 12/03/2015: ALT 18; BUN 29*; Creatinine, Ser 1.46; Potassium 4.5; Sodium 141   Lipid Panel    Component Value Date/Time   CHOL 114* 06/15/2015 0828   TRIG 61 06/15/2015 0828   HDL 41 06/15/2015 0828   CHOLHDL 2.8 06/15/2015 0828   VLDL 12 06/15/2015 0828   LDLCALC 61 06/15/2015 0828    Additional studies/ records that were reviewed today include:  Viewed recent hospital stay. IV diuresis was given for lower extremity swelling. He was in sinus rhythm on that admission.  Prior cardiac catheterization: 04/2015   Chronic stable coronary artery disease with lesions as noted above. There is moderate to moderately severe involvement in all 3 territories.  Chronic systolic heart  failure with normal left ventricular end-diastolic pressure. EF 40%.  Severe pulmonary hypertension. PA systolic pressure 75 mmHg.   Recommendations:   Medical therapy per cardiology and pulmonary consultants.  ASSESSMENT:    1. Paroxysmal atrial fibrillation (HCC)   2. HYPERTENSION, BENIGN   3. IPF (idiopathic pulmonary fibrosis) ([place])   4. Coronary artery disease involving native coronary artery of native heart without angina pectoris   5. Long term (current) use of anticoagulants   6. Essential hypertension      PLAN:  In order of problems listed above:  1. No evidence of atrial fibrillation on last several EKGs. Diagnoses of atrial fibrillation could be related to misinterpretation of multifocal atrial tachycardia/wandering atrial pacemaker rhythm. No specific action required at this time.  I would continue Coumadin anticoagulation.  2.  Low-salt diet recommended  3.  Follow with Dr. Chase Caller. He has significant right heart failure with severe pulmonary hypertension causing edema in both lower extremities. We will add low dose spironolactone to decrease edema. This will be added on to his current furosemide 80 mg a.m. and 40 mg p.m. Bmet will be done in one week. 4. No symptoms from the standpoint of CAD. 5. No bleeding on Coumadin.   Medication Adjustments/Labs and Tests Ordered: Current medicines are reviewed at length with the patient today.  Concerns regarding medicines are outlined above.  Medication changes, Labs and Tests ordered today are listed in the Patient Instructions below. There are no Patient Instructions on file for this visit.   Signed, Sinclair Grooms, MD  12/10/2015 9:58 AM    [facility] [place], [place], [place]  [address] Phone: [phone]; Fax: [phone]

## 2015-12-10 NOTE — Patient Instructions (Signed)
Medication Instructions:  Your physician has recommended you make the following change in your medication:  START Spironolactone 61m daily each morning. An Rx has been sent to your pharmacy   Labwork: Your physician recommends that you return for lab work (bmet) on 12/16/15 between 7:30am-5:15pm   Testing/Procedures: None ordered  Follow-Up: Your physician wants you to follow-up in: 6 months with Dr.Smith You will receive a reminder letter in the mail two months in advance. If you don't receive a letter, please call our office to schedule the follow-up appointment.   Any Other Special Instructions Will Be Listed Below (If Applicable).     If you need a refill on your cardiac medications before your next appointment, please call your pharmacy.

## 2015-12-15 ENCOUNTER — Telehealth: Payer: Self-pay | Admitting: Internal Medicine

## 2015-12-15 NOTE — Telephone Encounter (Signed)
Spoke with pt. 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 Pt would like another one. A copy of this has been placed up front for pick up. Nothing further was needed.

## 2015-12-17 ENCOUNTER — Other Ambulatory Visit (INDEPENDENT_AMBULATORY_CARE_PROVIDER_SITE_OTHER): Payer: Medicare Other | Admitting: *Deleted

## 2015-12-17 DIAGNOSIS — I10 Essential (primary) hypertension: Secondary | ICD-10-CM

## 2015-12-17 DIAGNOSIS — I480 Paroxysmal atrial fibrillation: Secondary | ICD-10-CM | POA: Diagnosis not present

## 2015-12-17 LAB — BASIC METABOLIC PANEL
BUN: 24 mg/dL (ref 7–25)
CALCIUM: 9.5 mg/dL (ref 8.6–10.3)
CO2: 26 mmol/L (ref 20–31)
CREATININE: 1.66 mg/dL — AB (ref 0.70–1.18)
Chloride: 104 mmol/L (ref 98–110)
Glucose, Bld: 122 mg/dL — ABNORMAL HIGH (ref 65–99)
Potassium: 4.6 mmol/L (ref 3.5–5.3)
Sodium: 143 mmol/L (ref 135–146)

## 2015-12-18 ENCOUNTER — Other Ambulatory Visit: Payer: Self-pay | Admitting: *Deleted

## 2015-12-18 NOTE — Patient Outreach (Signed)
Call received back from member.  He report that he is doing "well."  He has had a visit with his cardiologist since last outreach and is now on spironolactone due to continued edema in his lower extremities, however he state that it has continued to improve since his discharge from the hospital.  He will follow up in 6 months.  He state that he was told that to continue his oxygen at 4 liters with exertion and 1-2 liters at rest.  He expresses concern with the 4 liters because his portable tank does not last that long.  He sate that he pulmonology office has reached out to [place] to see if there is a solution or another portable tank that will last longer than a few hours.  He report that he has PFT testing on the 16th, however according to chart (reviewed after ending call) his study is not until 8/16, and he is to follow up with pulmonology after his testing.  Member denies any concerns at this time.  Original plan was follow up next week after PFT studies were done.  This care manager will still follow up next week to assure he is aware of the correct dates for his testing, but will close case at that time if there are no concerns.  He is aware of heart failure zones and when to contact the physician.  He does daily weights and follows a low salt diet (his wife prepares all food for him).  Will follow up next week.  Valente David, BSN, [place] Management  [facility] Care Manager [phone]

## 2015-12-18 NOTE — Patient Outreach (Signed)
Call placed to member to assess the need for continued involvement with [facility] as he previously stated that he wanted to think about ongoing services (he has completed transition of care).  No answer, voice message left on both home and cell phone.  Will await call back, will follow up within the next 2 weeks.  Valente David, [place], MSN [place] [phone]

## 2015-12-21 ENCOUNTER — Other Ambulatory Visit: Payer: Self-pay | Admitting: Internal Medicine

## 2015-12-21 ENCOUNTER — Telehealth: Payer: Self-pay | Admitting: Internal Medicine

## 2015-12-21 NOTE — Telephone Encounter (Signed)
Spoke with Leafy Ro  She wanted [place] to know that she is working on different options for POC that goes to at least 4lpm  Just an [place], can close this msg per [employer]

## 2015-12-23 ENCOUNTER — Telehealth: Payer: Self-pay | Admitting: Interventional Cardiology

## 2015-12-23 DIAGNOSIS — I509 Heart failure, unspecified: Secondary | ICD-10-CM

## 2015-12-23 NOTE — Telephone Encounter (Signed)
F/u  Pt stated better to call him on mobile #- [phone]

## 2015-12-23 NOTE — Telephone Encounter (Signed)
Pt aware of lab results with verbal understanding. Pt sts that he has had some improvement in his LE swelling, swelling has not completely resolved. He weight is down around 3lbs. He will continue Spironolactone as prescribed,adv him Dr.Smith would like to repeat labs (bmet) in 1 month. Pt is agreeable, he will need to look at his schedule and call back to schedule his lab appt.

## 2015-12-23 NOTE — Telephone Encounter (Signed)
Fu  Pt returning RN phone call- lab results. Please call back and discuss.

## 2015-12-23 NOTE — Telephone Encounter (Signed)
-----  Message from Belva Crome, MD sent at 12/19/2015 11:50 AM EDT ----- Please let patient know labs are okay after increase in diuretic regimen. How does he feel and is there decrease in fluid?  Needs BMET in 1 month if spironolactone is helping and he has weight loss/decreased edema in legs.

## 2015-12-24 ENCOUNTER — Ambulatory Visit (INDEPENDENT_AMBULATORY_CARE_PROVIDER_SITE_OTHER): Payer: Medicare Other

## 2015-12-24 DIAGNOSIS — I6350 Cerebral infarction due to unspecified occlusion or stenosis of unspecified cerebral artery: Secondary | ICD-10-CM | POA: Diagnosis not present

## 2015-12-24 DIAGNOSIS — I4891 Unspecified atrial fibrillation: Secondary | ICD-10-CM

## 2015-12-24 DIAGNOSIS — I480 Paroxysmal atrial fibrillation: Secondary | ICD-10-CM

## 2015-12-24 DIAGNOSIS — Z7901 Long term (current) use of anticoagulants: Secondary | ICD-10-CM

## 2015-12-24 LAB — POCT INR: INR: 2.5

## 2015-12-25 ENCOUNTER — Encounter: Payer: Self-pay | Admitting: *Deleted

## 2015-12-25 ENCOUNTER — Other Ambulatory Visit: Payer: Self-pay | Admitting: *Deleted

## 2015-12-25 NOTE — Patient Outreach (Signed)
Call placed to member to inquire about additional concerns.  He denies any, stating that he is aware of his PFT studies scheduled for August.  He reports he is doing well and has follow up appointments with Dr. Tamala Julian and Dr. Carlis Abbott within the next month.  He is made aware that all goals have been met and case will be closed.  Advised to contact [facility] if his condition should change, requiring additional services.  He verbalizes understanding.  Will notify care management assistant and PCP of case closure.  Valente David, [place], MSN [place] [phone]

## 2015-12-28 ENCOUNTER — Telehealth: Payer: Self-pay | Admitting: Internal Medicine

## 2015-12-28 ENCOUNTER — Ambulatory Visit: Payer: Self-pay | Admitting: *Deleted

## 2015-12-28 NOTE — Telephone Encounter (Signed)
Spoke with pt. He had a question about his POC order. Pt has not heard from [place] about this order. Advised him that he would need to contact [facility]. We received a confirmation from [place] on this order. Pt verbalized understanding. Nothing further was needed.

## 2016-01-04 ENCOUNTER — Telehealth: Payer: Self-pay | Admitting: Internal Medicine

## 2016-01-04 NOTE — Telephone Encounter (Signed)
Patient says that his Mychart messaging is not working.  He called IT and they told him to contact our office to get MR's direct email address. Advised him that we could not give out the providers direct email.    Patient says that he received letter from [facility] stating that patient needs re-certification for his oxygen by Sept 17, 2017.  Patient says that he also needs a new battery for his POC.  Patient came in on 12/03/15 and his oxygen dropped to 67% on RA when he arrived.  After placing him on 4L o2 his oxygen went up to 97% (in OV notes).   MR, is it okay to use the Sats from 12/03/15 visit to place [place] order with [facility]?  Also, is it okay to send order for new battery? Please advise.

## 2016-01-04 NOTE — Telephone Encounter (Signed)
Spoke with pt. He needs to be requalified for his oxygen with [employer]. [employer] will not take sats from 12/03/15. He has been scheduled with MW on 01/06/16 at 9:30am. Nothing further was needed.

## 2016-01-04 NOTE — Telephone Encounter (Signed)
Re using sats from 12/03/15 - that is a regulatory issue. Needs to be within 30d sometime. So ask [employer] but if they are ok I am ok  Ok for new battery order

## 2016-01-06 ENCOUNTER — Ambulatory Visit (INDEPENDENT_AMBULATORY_CARE_PROVIDER_SITE_OTHER): Payer: Medicare Other | Admitting: Internal Medicine

## 2016-01-06 ENCOUNTER — Encounter: Payer: Self-pay | Admitting: Internal Medicine

## 2016-01-06 VITALS — BP 124/70 | HR 80 | Ht 70.0 in | Wt 199.0 lb

## 2016-01-06 DIAGNOSIS — J9611 Chronic respiratory failure with hypoxia: Secondary | ICD-10-CM | POA: Diagnosis not present

## 2016-01-06 DIAGNOSIS — J84112 Idiopathic pulmonary fibrosis: Secondary | ICD-10-CM

## 2016-01-06 DIAGNOSIS — I6350 Cerebral infarction due to unspecified occlusion or stenosis of unspecified cerebral artery: Secondary | ICD-10-CM

## 2016-01-06 DIAGNOSIS — J432 Centrilobular emphysema: Secondary | ICD-10-CM | POA: Diagnosis not present

## 2016-01-06 NOTE — Patient Instructions (Addendum)
No change in recommendations but we may need to re think your inhalers if you don't think they are helping you   Keep appt for follow up with Dr Chase Caller

## 2016-01-06 NOTE — Progress Notes (Signed)
Subjective:     Patient ID: Patrick Weiss, male   DOB: [birth year], 75 y.o.   MRN: [id number]  HPI   OV 09/07/2015  Chief Complaint  Patient presents with  . Follow-up    Pt does not feel that Esbriet is helping much. Pt uses 2.5-4Liters O2 per/min.    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 This is associated with cor pulmonale and also chronic systolic heart failure ejection fraction 45% in February 2016. He is on anti-fibrotic therapy Esbriet since approximately November 2016.   He presents for follow-up with his wife. Overall he feels that the drug might not be helping him. He understands that the anti-fibrotic therapies for prevention and is not necessarily designed to make him feel better. Wife reports that he had a low appetite initially after starting the drug but since then his appetite is picked back up to normal. However he is having fatigue that is significantly worse after starting the drug. It is moderate in intensity. His oxygen level uses around the same. He is also reporting significant bilateral pedal edema for which she has seen Dr. Pernell Dupre. Most recent chemistries reveal a BNP of 750 and a creatinine of 1.62 mg percent. The creatinine is slightly higher than December 2016 when it was only 1.5 mg percent. His last liver function test was in December 2016 and it was normal. He is due for repeat liver function test today. Off note, he is not ideal candidate for Ofev due to anticoagulation  He is also reporting changes in his sleep pattern and is wondering if it's because of IPF anti-fibrotic therapy   OV 12/03/2015  Chief Complaint  Patient presents with  . Follow-up    Pt c/o continued SOB. He is on 1L O2 with POC when ambulating and 4L O2 via POC when at home. Pt's O2 in room was 67 on POC @ 1L. Pt also c/o cough with mostly clear and occasional wheeze/chest tightness.  Pt denies CP.      Follow-up idiopathic pulmonary fibrosis on Pirfenidone (Esbriet) 2 tablets 3 times a day  He had issues with higher dose of Pirfenidone (Esbriet) but he is tolerating the lower dose 2 tablets 3 times daily without problems. He did get admitted in April 2017 with congestive heart failure according to his history. He did have a chest x-ray with that I reviewed the results. He also had blood work that showed mild renal insufficiency but normal liver function tests. I visualize these results myself. At this point in time he feels overall he is stable. He is trying to get into an exercise program [facility]. He is more sedentary. He is using 1 L of oxygen at rest with 4 L at exertion. Today at rest on room air for 20 minutes his pulse ox of 82%. When he walks from the waiting area to our office room on 1 L oxygen he dropped a 67%. He says his most recent admission he had significant pedal edema and he was diuresed. He still has significant pedal edema.f note he is wondering why he should apply sunscreen with IPF. He says that he never told him that. Apparently the booklets from [place] tell him that. rec 1-2 at rest and up to 4lpm with activity  Do bmet and LFT 12/03/2015 Continue esbriet at lower dose of 2 tab tid Continue o2 1L at rest and 4L with exertion You might qualify for  future research trial looking at o2 capacity; will let you know if we have a study like that Do PFt  In 2 months Stay active to extent possible APPLY SUNSCREEN oN ESBRIET  FOllowu  afteer PFT in 2 months    01/06/2016  f/u ov/Devory Mckinzie re: PF/ 02 dep/ on spiriva with min airflow obst on pfts  Chief Complaint  Patient presents with  . Follow-up    MR pt here for o2 requalification. He uses 4lpm 24/7- [facility].    on 02 4lpm more limited by legs than breathing/ some am cough /congestion Not sure inhalers help at all   No obvious day to day or daytime variability or assoc excess/ purulent sputum or mucus  plugs or hemoptysis or cp or chest tightness, subjective wheeze or overt sinus or hb symptoms. No unusual exp hx or h/o childhood pna/ asthma or knowledge of premature birth.  Sleeping ok without nocturnal  or early am exacerbation  of respiratory  c/o's or need for noct saba. Also denies any obvious fluctuation of symptoms with weather or environmental changes or other aggravating or alleviating factors except as outlined above   Current Medications, Allergies, Complete Past Medical History, Past Surgical History, Family History, and Social History were reviewed in [employer] record.  ROS  The following are not active complaints unless bolded sore throat, dysphagia, dental problems, itching, sneezing,  nasal congestion or excess/ purulent secretions, ear ache,   fever, chills, sweats, unintended wt loss, classically pleuritic or exertional cp,  orthopnea pnd or leg swelling, presyncope, palpitations, abdominal pain, anorexia, nausea, vomiting, diarrhea  or change in bowel or bladder habits, change in stools or urine, dysuria,hematuria,  rash, arthralgias, visual complaints, headache, numbness, weakness or ataxia or problems with walking or coordination,  change in mood/affect or memory.                Objective:   Physical Exam  Constitutional: He is oriented to person, place, and time. He appears well-developed and well-nourished. No distress.   Wt Readings from Last 3 Encounters:  01/06/16 199 lb (90.266 kg)  12/10/15 209 lb (94.802 kg)  12/03/15 208 lb (94.348 kg)    Vital signs reviewed  HENT:  Head: Normocephalic and atraumatic.  Right Ear: External ear normal.  Left Ear: External ear normal.  Mouth/Throat: Oropharynx is clear and moist. No oropharyngeal exudate.  Eyes: Conjunctivae and EOM are normal. Pupils are equal, round, and reactive to light. Right eye exhibits no discharge. Left eye exhibits no discharge. No scleral icterus.  Neck: Normal range of  motion. Neck supple. No JVD present. No tracheal deviation present. No thyromegaly present.  Cardiovascular: Normal rate, regular rhythm and intact distal pulses.  Exam reveals no gallop and no friction rub.   No murmur heard. Pulmonary/Chest: Effort normal. No respiratory distress. He has no wheezes. He has insp dry rales. He exhibits no tenderness.  Abdominal: Soft. Bowel sounds are normal. He exhibits no distension and no mass. There is no tenderness. There is no rebound and no guarding.  Musculoskeletal: Normal range of motion. He exhibits  1+ bilateral pedal/ankle edema. He exhibits no tenderness.  Lymphadenopathy:    He has no cervical adenopathy.  Neurological: He is alert and oriented to person, place, and time. He has normal reflexes. No cranial nerve deficit. Coordination normal.  Skin: Skin is warm and dry. No rash noted. He is not diaphoretic. No erythema. No pallor.  Psychiatric: He has a normal mood and affect.  His behavior is normal. Judgment and thought content normal.       Labs ordered/ reviewed:     Chemistry      Component Value Date/Time   NA 143 12/17/2015 0904   K 4.6 12/17/2015 0904   CL 104 12/17/2015 0904   CO2 26 12/17/2015 0904   BUN 24 12/17/2015 0904   CREATININE 1.66* 12/17/2015 0904   CREATININE 1.46 12/03/2015 1601      Component Value Date/Time   CALCIUM 9.5 12/17/2015 0904   ALKPHOS 76 12/03/2015 1601   AST 20 12/03/2015 1601   ALT 18 12/03/2015 1601   BILITOT 1.1 12/03/2015 1601                Assessment:

## 2016-01-07 ENCOUNTER — Encounter: Payer: Self-pay | Admitting: Internal Medicine

## 2016-01-07 NOTE — Assessment & Plan Note (Signed)
Sats 73% at rest 01/06/2016 >  On 4lpm sats over 90%   Clearly still needs 02 50/1/ recertified

## 2016-01-07 NOTE — Assessment & Plan Note (Signed)
Complicated by chronic hypoxemic RF/ no recent clinical changes on esbriet and tol ok > no change in recs  I had an extended discussion with the patient reviewing all relevant studies completed to date and  lasting 15 to 20 minutes of a 25 minute visit    Each maintenance medication was reviewed in detail including most importantly the difference between maintenance and prns and under what circumstances the prns are to be triggered using an action plan format that is not reflected in the computer generated alphabetically organized AVS.    Please see instructions for details which were reviewed in writing and the patient given a copy highlighting the part that I personally wrote and discussed at today's ov

## 2016-01-07 NOTE — Assessment & Plan Note (Signed)
He really has minimal airflow obst ? Benefit or not from LAMA, low threshold to d/c

## 2016-01-14 ENCOUNTER — Encounter (INDEPENDENT_AMBULATORY_CARE_PROVIDER_SITE_OTHER): Payer: Medicare Other | Admitting: Internal Medicine

## 2016-01-14 ENCOUNTER — Other Ambulatory Visit (INDEPENDENT_AMBULATORY_CARE_PROVIDER_SITE_OTHER): Payer: Medicare Other

## 2016-01-14 DIAGNOSIS — I509 Heart failure, unspecified: Secondary | ICD-10-CM

## 2016-01-14 DIAGNOSIS — Z006 Encounter for examination for normal comparison and control in clinical research program: Secondary | ICD-10-CM

## 2016-01-14 LAB — BASIC METABOLIC PANEL
BUN: 28 mg/dL — AB (ref 7–25)
CHLORIDE: 104 mmol/L (ref 98–110)
CO2: 28 mmol/L (ref 20–31)
CREATININE: 1.64 mg/dL — AB (ref 0.70–1.18)
Calcium: 9.5 mg/dL (ref 8.6–10.3)
Glucose, Bld: 81 mg/dL (ref 65–99)
Potassium: 4.5 mmol/L (ref 3.5–5.3)
Sodium: 142 mmol/L (ref 135–146)

## 2016-01-14 NOTE — Progress Notes (Signed)
Subject returns at our request to reconsent for the IPF Pro Registry study.  It was discovered during an internal chart review process that the subject had signed page 13 of the consent which appeared to be a revocation authorization.  Though the subject chart had been submitted to the sponsor for approval at initial visit and a monitor visit was conducted after consent, this issue failed to come to light.  This site investigated this issue for clarification and received an e-mail from the central IRB, [employer] confirming, "page 13 of the Informed Consent and Authorization Form is for the subject to take home and use only if they choose to withdraw their authorization for you to use their PHI and samples". (Please see TMF for a copy of this email).  This this is the case , the subject was contacted and agreed to return to our office to reconsent.  Again, the entire consent was explained to the subject outlining all risks, benefits, purpose and voluntary status of any research.  All GCP guidelines were followed and the subject voluntarily signed the consent in the presence of this Coordinator.  A copy of the consent was made and given to the subject, also page 13 was given to the subject as well per the instructions of the central IRB.

## 2016-01-18 ENCOUNTER — Other Ambulatory Visit: Payer: Self-pay

## 2016-01-18 ENCOUNTER — Telehealth: Payer: Self-pay | Admitting: Interventional Cardiology

## 2016-01-18 DIAGNOSIS — I509 Heart failure, unspecified: Secondary | ICD-10-CM

## 2016-01-18 NOTE — Telephone Encounter (Signed)
Pt sts that his sob is stable and has noticed great improvement in LE swelling. FYI fwd to  Dr.Smith

## 2016-01-18 NOTE — Telephone Encounter (Signed)
New Message   Pt returning call to get lab results. Please call.

## 2016-01-21 ENCOUNTER — Ambulatory Visit (INDEPENDENT_AMBULATORY_CARE_PROVIDER_SITE_OTHER): Payer: Medicare Other | Admitting: *Deleted

## 2016-01-21 DIAGNOSIS — I480 Paroxysmal atrial fibrillation: Secondary | ICD-10-CM

## 2016-01-21 DIAGNOSIS — Z7901 Long term (current) use of anticoagulants: Secondary | ICD-10-CM

## 2016-01-21 DIAGNOSIS — I4891 Unspecified atrial fibrillation: Secondary | ICD-10-CM

## 2016-01-21 DIAGNOSIS — I6350 Cerebral infarction due to unspecified occlusion or stenosis of unspecified cerebral artery: Secondary | ICD-10-CM

## 2016-01-21 LAB — POCT INR: INR: 2.3

## 2016-02-03 ENCOUNTER — Encounter: Payer: Self-pay | Admitting: *Deleted

## 2016-02-12 ENCOUNTER — Telehealth: Payer: Self-pay | Admitting: Interventional Cardiology

## 2016-02-12 NOTE — Telephone Encounter (Signed)
New message     The pt has a question about the lab prior to appt in December 5 th he wants to know should he have it done right before hi appoinment

## 2016-02-19 ENCOUNTER — Telehealth: Payer: Self-pay | Admitting: Internal Medicine

## 2016-02-19 NOTE — Telephone Encounter (Signed)
Called pt back and he is aware of the esbriet that has been approved. Nothing further is needed.

## 2016-02-19 NOTE — Telephone Encounter (Signed)
[phone] calling back

## 2016-02-19 NOTE — Telephone Encounter (Signed)
Spoke with pt, states that his Esbriet needs a PA.    Called [phone] option 2, option 0 at pt request.  PA done over the phone, PA has been approved 12/21/15-02/18/2017.    Called [facility] to make aware.    Called pt, lmtcb X1 to make aware that this PA has been approved.

## 2016-02-24 ENCOUNTER — Encounter: Payer: Self-pay | Admitting: Internal Medicine

## 2016-02-24 ENCOUNTER — Ambulatory Visit (INDEPENDENT_AMBULATORY_CARE_PROVIDER_SITE_OTHER): Payer: Medicare Other | Admitting: Internal Medicine

## 2016-02-24 ENCOUNTER — Other Ambulatory Visit (INDEPENDENT_AMBULATORY_CARE_PROVIDER_SITE_OTHER): Payer: Medicare Other

## 2016-02-24 ENCOUNTER — Telehealth: Payer: Self-pay | Admitting: Internal Medicine

## 2016-02-24 VITALS — BP 102/58 | HR 89 | Ht 70.0 in | Wt 180.0 lb

## 2016-02-24 DIAGNOSIS — J84112 Idiopathic pulmonary fibrosis: Secondary | ICD-10-CM

## 2016-02-24 DIAGNOSIS — I6350 Cerebral infarction due to unspecified occlusion or stenosis of unspecified cerebral artery: Secondary | ICD-10-CM

## 2016-02-24 DIAGNOSIS — J9611 Chronic respiratory failure with hypoxia: Secondary | ICD-10-CM

## 2016-02-24 DIAGNOSIS — Z5181 Encounter for therapeutic drug level monitoring: Secondary | ICD-10-CM

## 2016-02-24 DIAGNOSIS — Z006 Encounter for examination for normal comparison and control in clinical research program: Secondary | ICD-10-CM

## 2016-02-24 DIAGNOSIS — I2781 Cor pulmonale (chronic): Secondary | ICD-10-CM

## 2016-02-24 LAB — PULMONARY FUNCTION TEST
DL/VA % pred: 30 %
DL/VA: 1.42 ml/min/mmHg/L
DLCO UNC % PRED: 19 %
DLCO UNC: 6.2 ml/min/mmHg
DLCO cor % pred: 17 %
DLCO cor: 5.77 ml/min/mmHg
FEF 25-75 PRE: 1.1 L/s
FEF 25-75 Post: 1.17 L/sec
FEF2575-%Change-Post: 7 %
FEF2575-%Pred-Post: 51 %
FEF2575-%Pred-Pre: 47 %
FEV1-%Change-Post: 2 %
FEV1-%PRED-POST: 73 %
FEV1-%PRED-PRE: 72 %
FEV1-PRE: 2.01 L
FEV1-Post: 2.06 L
FEV1FVC-%Change-Post: 1 %
FEV1FVC-%Pred-Pre: 88 %
FEV6-%CHANGE-POST: 0 %
FEV6-%PRED-POST: 83 %
FEV6-%PRED-PRE: 82 %
FEV6-POST: 2.97 L
FEV6-PRE: 2.95 L
FEV6FVC-%CHANGE-POST: 0 %
FEV6FVC-%PRED-POST: 104 %
FEV6FVC-%PRED-PRE: 103 %
FVC-%CHANGE-POST: 0 %
FVC-%Pred-Post: 79 %
FVC-%Pred-Pre: 79 %
FVC-Post: 3.01 L
FVC-Pre: 2.99 L
POST FEV1/FVC RATIO: 68 %
POST FEV6/FVC RATIO: 99 %
Pre FEV1/FVC ratio: 67 %
Pre FEV6/FVC Ratio: 99 %
RV % PRED: 92 %
RV: 2.34 L
TLC % PRED: 74 %
TLC: 5.22 L

## 2016-02-24 LAB — BASIC METABOLIC PANEL
BUN: 36 mg/dL — ABNORMAL HIGH (ref 6–23)
CALCIUM: 10.6 mg/dL — AB (ref 8.4–10.5)
CO2: 31 mEq/L (ref 19–32)
CREATININE: 1.76 mg/dL — AB (ref 0.40–1.50)
Chloride: 102 mEq/L (ref 96–112)
GFR: 48.88 mL/min — AB (ref >60.00)
Glucose, Bld: 88 mg/dL (ref 70–99)
Potassium: 4 mEq/L (ref 3.5–5.1)
SODIUM: 143 meq/L (ref 135–145)

## 2016-02-24 LAB — HEPATIC FUNCTION PANEL
ALBUMIN: 4.4 g/dL (ref 3.5–5.2)
ALK PHOS: 76 U/L (ref 39–117)
ALT: 15 U/L (ref 0–53)
AST: 18 U/L (ref 0–37)
BILIRUBIN DIRECT: 0.6 mg/dL — AB (ref 0.0–0.3)
TOTAL PROTEIN: 7.5 g/dL (ref 6.0–8.3)
Total Bilirubin: 1.3 mg/dL — ABNORMAL HIGH (ref 0.2–1.2)

## 2016-02-24 NOTE — Telephone Encounter (Signed)
Sure. I will set up.

## 2016-02-24 NOTE — Patient Instructions (Addendum)
ICD-9-CM ICD-10-CM   1. Chronic hypoxemic respiratory failure (HCC) 518.83 J96.11    799.02    2. IPF (idiopathic pulmonary fibrosis) (HCC) 516.31 J84.112   3. Cor pulmonale, chronic (HCC) 416.9 I27.81   4. Encounter for therapeutic drug monitoring V58.83 Z51.81     IPF is worse 5 to 20% range compared to a year ago Some of this can be worsening pulmonary hypertension called cor pulmonale due to stress on right hear  Plan = dc spiriva  - ok for flovent per your choice - Check chemistry renal function and liver function - Continue Pirfenidone (Esbriet) 2 tablets 3 times daily; there is a change in the lab and we will let you know and adjust the dose accordingly - Ensure sunscreen at all times - Recommend right heart catheterization - will send message to Dr. Pernell Dupre - Meet with the research coordinator for your IPF-pro-registry study -  flu shot in the fall   Follow up  - 2 months or sooner if needed

## 2016-02-24 NOTE — Progress Notes (Signed)
Subjective:     Patient ID: Patrick Weiss, male   DOB: Aug 30, 1940, 75 y.o.   MRN: [id number]  HPI   OV 09/07/2015  Chief Complaint  Patient presents with  . Follow-up    Pt does not feel that Esbriet is helping much. Pt uses 2.5-4Liters O2 per/min.    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 This is associated with cor pulmonale and also chronic systolic heart failure ejection fraction 45% in February 2016. He is on anti-fibrotic therapy Esbriet since approximately November 2016.   He presents for follow-up with his wife. Overall he feels that the drug might not be helping him. He understands that the anti-fibrotic therapies for prevention and is not necessarily designed to make him feel better. Wife reports that he had a low appetite initially after starting the drug but since then his appetite is picked back up to normal. However he is having fatigue that is significantly worse after starting the drug. It is moderate in intensity. His oxygen level uses around the same. He is also reporting significant bilateral pedal edema for which she has seen Dr. Pernell Dupre. Most recent chemistries reveal a BNP of 750 and a creatinine of 1.62 mg percent. The creatinine is slightly higher than December 2016 when it was only 1.5 mg percent. His last liver function test was in December 2016 and it was normal. He is due for repeat liver function test today. Off note, he is not ideal candidate for Ofev due to anticoagulation  He is also reporting changes in his sleep pattern and is wondering if it's because of IPF anti-fibrotic therapy   OV 12/03/2015  Chief Complaint  Patient presents with  . Follow-up    Pt c/o continued SOB. He is on 1L O2 with POC when ambulating and 4L O2 via POC when at home. Pt's O2 in room was 67 on POC @ 1L. Pt also c/o cough with mostly clear and occasional wheeze/chest tightness.  Pt denies CP.      Follow-up idiopathic pulmonary fibrosis on Pirfenidone (Esbriet) 2 tablets 3 times a day  He had issues with higher dose of Pirfenidone (Esbriet) but he is tolerating the lower dose 2 tablets 3 times daily without problems. He did get admitted in April 2017 with congestive heart failure according to his history. He did have a chest x-ray with that I reviewed the results. He also had blood work that showed mild renal insufficiency but normal liver function tests. I visualize these results myself. At this point in time he feels overall he is stable. He is trying to get into an exercise program at [facility]. He is more sedentary. He is using 1 L of oxygen at rest with 4 L at exertion. Today at rest on room air for 20 minutes his pulse ox of 82%. When he walks from the waiting area to our office room on 1 L oxygen he dropped a 67%. He says his most recent admission he had significant pedal edema and he was diuresed. He still has significant pedal edema.f note he is wondering why he should apply sunscreen with IPF. He says that he never told him that. Apparently the booklets from [place] tell him that.   OV 02/24/2016  Chief Complaint  Patient presents with  . Follow-up    Pt states his breathing is unchanged since last OV. Pt c/o prod cough with clear to yellow mucus in morning.  FU IPF/chronic hypoxemic resp failure - on Pirfenidone (Esbriet) 2 tablets 3 times a day. He had lung function test today. His FVC is 2.99 L in approximately 5% on compared to one year ago. His DLCO is 5.77/17% and is approximately 25% on compared to one year ago. He does not feel his difference. He uses between 4 and 5 L oxygen and gets around and does all his activities of daily living is fine. He has worsening pedal edema. He saw a dermatologist yesterday and has some skin excoriation in his shin. His dermatologist as advised him that this is not due to Pirfenidone (Esbriet). In review of his oxygenation 4L  [place] at rest but  5 min after rooming in 85% -> after stoppoing talking -> 90% and I think this is worse from before  Labs shows rising creat 1.46 in may 2017 -> 1.46m% July 2017. He is noted to be on Lasix and Aldactone  Echocardiogram February 2016 shows elevated pulmonary artery systolic pressure of 60 mmHg  Off note he is being followed as a research but this event in the IPF-pro Registry noninterventional trial (only intervention being questionnaire and lab draws]. This is standard of care visit'  Also of note is noticed to be on Flovent and Spiriva. Unclear to me why. Last visit with Dr. MLegrand Comowert he was asked to stop both inhalers but he says the Flovent helps him. The Spiriva does not and therefore he is requesting this to be stopped   has a past medical history of A-fib ([facility]; CHF (congestive heart failure) ([facility]; Hypertension; Pulmonary fibrosis ([facility]; Shortness of breath dyspnea; and Stroke ([facility].   reports that he quit smoking about 24 years ago. His smoking use included Cigarettes. He has a 30.00 pack-year smoking history. He has never used smokeless tobacco.  Past Surgical History:  Procedure Laterality Date  . BACK SURGERY  2001  . CARDIAC CATHETERIZATION N/A 04/28/2015   Procedure: Right/Left Heart Cath and Coronary Angiography;  Surgeon: HBelva Crome MD;  [facility];  Service: Cardiovascular;  Laterality: N/A;  . TIBIA FRACTURE SURGERY  1975    No Known Allergies  Immunization History  Administered Date(s) Administered  . Influenza Split 03/11/2014  . Influenza,inj,Quad PF,36+ Mos 03/16/2015    Family History  Problem Relation Age of Onset  . Cancer Father      Current Outpatient Prescriptions:  .  allopurinol (ZYLOPRIM) 100 MG tablet, Take 100 mg by mouth daily., Disp: , Rfl:  .  atorvastatin (LIPITOR) 10 MG tablet, Take 1 tablet (10 mg total) by mouth daily., Disp: 90 tablet, Rfl: 3 .  Calcium Carbonate-Vitamin D (CALCIUM 600+D) 600-200  MG-UNIT TABS, Take 1 tablet by mouth 2 (two) times daily., Disp: , Rfl:  .  Carboxymethylcellulose Sodium (REFRESH TEARS OP), Place 1 drop into both eyes daily as needed (dry eyes). , Disp: , Rfl:  .  fexofenadine (ALLEGRA) 180 MG tablet, Take 180 mg by mouth daily as needed for allergies. , Disp: , Rfl:  .  fluticasone (FLOVENT HFA) 44 MCG/ACT inhaler, Inhale 2 puffs into the lungs 2 (two) times daily., Disp: 1 Inhaler, Rfl: 12 .  furosemide (LASIX) 40 MG tablet, Take 1 tablet by mouth at bedtime., Disp: , Rfl: 3 .  furosemide (LASIX) 80 MG tablet, Take 80 mg by mouth daily., Disp: , Rfl:  .  NON FORMULARY, Place 4 L into the nose continuous. , Disp: , Rfl:  .  Pirfenidone 267 MG CAPS, Take  2 capsules by mouth 3 (three) times daily. , Disp: , Rfl:  .  Spacer/Aero-Holding Chambers (AEROCHAMBER MV) inhaler, Use as instructed, Disp: 1 each, Rfl: 0 .  spironolactone (ALDACTONE) 25 MG tablet, Take 1 tablet (25 mg total) by mouth daily., Disp: 30 tablet, Rfl: 11 .  triamcinolone cream (KENALOG) 0.1 %, Apply 1 application topically 2 (two) times daily., Disp: , Rfl:  .  valsartan (DIOVAN) 160 MG tablet, Take 160 mg by mouth daily., Disp: , Rfl:  .  warfarin (COUMADIN) 5 MG tablet, TAKE AS DIRECTED, Disp: 120 tablet, Rfl: 1 .  SPIRIVA RESPIMAT 2.5 MCG/ACT AERS, INHALE 2 PUFFS INTO THE LUNGS DAILY (Patient not taking: Reported on 02/24/2016), Disp: 4 g, Rfl: 5   Review of Systems     Objective:   Physical Exam  Constitutional: He is oriented to person, place, and time. He appears well-developed and well-nourished. No distress.  HENT:  Head: Normocephalic and atraumatic.  Right Ear: External ear normal.  Left Ear: External ear normal.  Mouth/Throat: Oropharynx is clear and moist. No oropharyngeal exudate.  OXYGEN on  Eyes: Conjunctivae and EOM are normal. Pupils are equal, round, and reactive to light. Right eye exhibits no discharge. Left eye exhibits no discharge. No scleral icterus.  Neck:  Normal range of motion. Neck supple. No JVD present. No tracheal deviation present. No thyromegaly present.  Cardiovascular: Normal rate, regular rhythm and intact distal pulses.  Exam reveals no gallop and no friction rub.   No murmur heard. Pulmonary/Chest: Effort normal. No respiratory distress. He has no wheezes. He has rales. He exhibits no tenderness.  Abdominal: Soft. Bowel sounds are normal. He exhibits no distension and no mass. There is no tenderness. There is no rebound and no guarding.  Musculoskeletal: Normal range of motion. He exhibits edema. He exhibits no tenderness.  ++ edema  Lymphadenopathy:    He has no cervical adenopathy.  Neurological: He is alert and oriented to person, place, and time. He has normal reflexes. No cranial nerve deficit. Coordination normal.  Skin: Skin is warm and dry. No rash noted. He is not diaphoretic. No erythema. No pallor.  Psychiatric: He has a normal mood and affect. His behavior is normal. Judgment and thought content normal.  Nursing note and vitals reviewed.   Vitals:   02/24/16 1000 02/24/16 1006  BP:  (!) 102/58  Pulse: 97 89  SpO2: (!) 85% 90%  Weight:  180 lb (81.6 kg)  Height:  _0  (1.778 m)        Assessment:       ICD-9-CM ICD-10-CM   1. Chronic hypoxemic respiratory failure (HCC) 518.83 J96.11    799.02    2. IPF (idiopathic pulmonary fibrosis) (HCC) 516.31 J84.112   3. Cor pulmonale, chronic (HCC) 416.9 I27.81   4. Encounter for therapeutic drug monitoring V58.83 Z51.81        Plan:       IPF is worse 5 to 20% range compared to a year ago Some of this can be worsening pulmonary hypertension called cor pulmonale due to stress on right hear  Plan = dc spiriva  - ok for flovent per your choice - Check chemistry renal function and liver function - Continue Pirfenidone (Esbriet) 2 tablets 3 times daily; there is a change in the lab and we will let you know and adjust the dose accordingly - Ensure sunscreen at  all times - Recommend right heart catheterization - will send message to Dr. Pernell Dupre -  Meet with the research coordinator for your IPF-pro-registry study -  flu shot in the fall   Follow up  - 2 months or sooner if needed      > 50% of this > 25 min visit spent in face to face counseling or coordination of care    Dr. Brand Males, M.D., [facility].C.P Pulmonary and Critical Care Medicine Staff Physician [facility] Pager: [phone], If no answer or between  15:00h - 7:00h: call [phone]  02/24/2016 5:45 PM

## 2016-02-24 NOTE — Telephone Encounter (Signed)
Hi Patrick Weiss  AH BOTT has worsening hypoxemia particularly significant drop in diffusion compared to a year ago. I think his pulmonary hypertension might be worse. We be able to please consider right heart catheterization so we can evaluate him for advanced pH therapies?   Thanks  Dr. Brand Males, M.D., [facility].C.P Pulmonary and Critical Care Medicine Staff Physician [place] [facility] Pager: [phone], If no answer or between  15:00h - 7:00h: call [phone]  02/24/2016 5:48 PM

## 2016-02-24 NOTE — Progress Notes (Signed)
Subject was due for his 6 month follow-up visit for the IPF-Pro Registry study, he was already on site for a standard of care follow-up with Dr. Chase Caller. Required questionnaires and blood work per protocol were performed. Original questionnaires are stored in the subject study binder. Subject stated his desire to continue participating in the registry. I thanked him for his time and contribution to research.

## 2016-02-26 ENCOUNTER — Telehealth: Payer: Self-pay | Admitting: Internal Medicine

## 2016-02-26 NOTE — Telephone Encounter (Signed)
Hi [name]: This the patient Patrick Weiss who needs right heart cath  Dr. Brand Males, M.D., [facility].C.P Pulmonary and Critical Care Medicine Staff Physician [place] [facility] Pager: [phone], If no answer or between  15:00h - 7:00h: call [phone]  02/26/2016 12:36 AM

## 2016-02-26 NOTE — Telephone Encounter (Signed)
  Patrick Weiss  Creat rising since hospital dc in April. From 1.5 now to 1.7. Please ask him  A) what dose of lasix he is taking? B) and if this was increased at discharge from hospital in apirl?  Thanks  Dr. Brand Males, M.D., [facility].C.P Pulmonary and Critical Care Medicine Staff Physician [place] [facility] Critical [facility] Pager: [phone], If no answer or between  15:00h - 7:00h: call [phone]  02/26/2016 5:20 AM      Recent Labs Lab 02/24/16 1106  NA 143  K 4.0  CL 102  CO2 31  GLUCOSE 88  BUN 36*  CREATININE 1.76*  CALCIUM 10.6*

## 2016-02-26 NOTE — Telephone Encounter (Signed)
ATC pt. No option for VM. WCB.

## 2016-02-27 NOTE — Telephone Encounter (Signed)
Please schedule this patient for right heart cath via right antecubital vein.

## 2016-02-27 NOTE — Telephone Encounter (Signed)
Please schedule this patient for right heart cath via the right antecubital vein.

## 2016-02-27 NOTE — Progress Notes (Signed)
pft visit  Dr. Brand Males, M.D., [facility].C.P Pulmonary and Critical Care Medicine Staff Physician [place] [facility] Critical [facility] Pager: [phone], If no answer or between  15:00h - 7:00h: call [phone]  02/27/2016 3:07 AM

## 2016-02-29 ENCOUNTER — Telehealth: Payer: Self-pay | Admitting: Internal Medicine

## 2016-02-29 DIAGNOSIS — I10 Essential (primary) hypertension: Secondary | ICD-10-CM

## 2016-02-29 DIAGNOSIS — J849 Interstitial pulmonary disease, unspecified: Secondary | ICD-10-CM

## 2016-02-29 NOTE — Telephone Encounter (Signed)
Called and spoke with pt and he is taking:  Lasix 80 mg  In the morning  spironlactone 25 mg  Daily Lasix 40 mg  In the evening.   He stated that if anything needs to be changed to call him back on his cell number. MR please advise. Thanks  No Known Allergies

## 2016-02-29 NOTE — Telephone Encounter (Signed)
See phone note 02/29/16

## 2016-02-29 NOTE — Telephone Encounter (Signed)
[phone], pt cb

## 2016-02-29 NOTE — Telephone Encounter (Signed)
lmtcb for pt.  

## 2016-02-29 NOTE — Telephone Encounter (Signed)
Daneil Dan  Creat rising since hospital dc in April. From 1.5 now to 1.7. Please ask him  A) what dose of lasix he is taking? B) and if this was increased at discharge from hospital in apirl?  Thanks ---  [facility] x1 for pt

## 2016-03-02 NOTE — Telephone Encounter (Signed)
Called pt to go over dates to schedule his cardiac cath with Dr.Smith. lmtcb

## 2016-03-03 ENCOUNTER — Telehealth: Payer: Self-pay | Admitting: Interventional Cardiology

## 2016-03-03 ENCOUNTER — Ambulatory Visit (INDEPENDENT_AMBULATORY_CARE_PROVIDER_SITE_OTHER): Payer: Medicare Other | Admitting: *Deleted

## 2016-03-03 DIAGNOSIS — I6350 Cerebral infarction due to unspecified occlusion or stenosis of unspecified cerebral artery: Secondary | ICD-10-CM | POA: Diagnosis not present

## 2016-03-03 DIAGNOSIS — I4891 Unspecified atrial fibrillation: Secondary | ICD-10-CM | POA: Diagnosis not present

## 2016-03-03 DIAGNOSIS — Z7901 Long term (current) use of anticoagulants: Secondary | ICD-10-CM

## 2016-03-03 DIAGNOSIS — Z01812 Encounter for preprocedural laboratory examination: Secondary | ICD-10-CM

## 2016-03-03 LAB — POCT INR: INR: 2.4

## 2016-03-03 NOTE — Telephone Encounter (Signed)
MR please advise thanks  

## 2016-03-03 NOTE — Telephone Encounter (Signed)
Follow Up:; ° ° °Returning your call. °

## 2016-03-04 NOTE — Telephone Encounter (Signed)
Called spoke with pt. Informed him that we are awaiting MR's recs. He voiced understanding and had no further questions.   MR please advise

## 2016-03-04 NOTE — Telephone Encounter (Signed)
Patient called stating he was returning call from today.  Advised from the notes it looks like our nurse is still waiting to hear from Dr. Chase Caller about the Lasix.  CB is [phone].

## 2016-03-04 NOTE — Telephone Encounter (Signed)
Spoke with pt. Pt request we send him a mychart message with the available dates in September to have his cardiac cath scheduled with Dr.Smith. He is also awaiting a call from [facility] office regarding the need for the procedure.  Pt sts that he will discuss it with his family and call back when he is ready to schedule.

## 2016-03-06 NOTE — Telephone Encounter (Signed)
Attempting to schedule right heart cath, but he is reluctant. He has put [place] on hold. Says he will speak with you.

## 2016-03-07 NOTE — Telephone Encounter (Signed)
tht is a good dose of lasix. My rec  1. Continue lasix/aldactone without change ->  Repeat bmet next week 2. I recommended right heart cath lastv visit and he agreed but Dr Daneen Schick his cardiologist got back to me saying he is rluctant? Please ask him why he is reluctant.   Thanks  Dr. Brand Males, M.D., [facility].C.P Pulmonary and Critical Care Medicine Staff Physician [place] [facility] and Critical Care Pager: [phone], If no answer or between  15:00h - 7:00h: call [phone]  03/07/2016 11:06 AM

## 2016-03-07 NOTE — Telephone Encounter (Signed)
Spoke with pt and advised to continue current dose of Lasix/Aldactone per MR.  Pt verbalized understanding.  Also FYI for MR, pt is scheduled for right heart cath with Dr Tamala Julian on 03/16/16.

## 2016-03-07 NOTE — Telephone Encounter (Signed)
Ok thanks  Dr. Brand Males, M.D., [facility].C.P Pulmonary and Critical Care Medicine Staff Physician [place] [facility] Critical [facility] Pager: [phone], If no answer or between  15:00h - 7:00h: call [phone]  03/07/2016 11:41 AM

## 2016-03-07 NOTE — Telephone Encounter (Signed)
Follow up  Pt voiced he's returning a nurses call about 9.6.17.  Please follow up with pt. Thanks!

## 2016-03-09 ENCOUNTER — Telehealth: Payer: Self-pay | Admitting: Pharmacist

## 2016-03-09 ENCOUNTER — Other Ambulatory Visit: Payer: Self-pay | Admitting: Interventional Cardiology

## 2016-03-09 ENCOUNTER — Other Ambulatory Visit (INDEPENDENT_AMBULATORY_CARE_PROVIDER_SITE_OTHER): Payer: Medicare Other

## 2016-03-09 DIAGNOSIS — J849 Interstitial pulmonary disease, unspecified: Secondary | ICD-10-CM

## 2016-03-09 DIAGNOSIS — I10 Essential (primary) hypertension: Secondary | ICD-10-CM

## 2016-03-09 DIAGNOSIS — I272 Pulmonary hypertension, unspecified: Secondary | ICD-10-CM

## 2016-03-09 LAB — BASIC METABOLIC PANEL
BUN: 32 mg/dL — AB (ref 6–23)
CALCIUM: 9.8 mg/dL (ref 8.4–10.5)
CHLORIDE: 104 meq/L (ref 96–112)
CO2: 30 meq/L (ref 19–32)
CREATININE: 1.65 mg/dL — AB (ref 0.40–1.50)
GFR: 52.65 mL/min — ABNORMAL LOW (ref >60.00)
Glucose, Bld: 79 mg/dL (ref 70–99)
Potassium: 4.3 mEq/L (ref 3.5–5.1)
Sodium: 142 mEq/L (ref 135–145)

## 2016-03-09 NOTE — Telephone Encounter (Signed)
Spoke with pt. Pt aware that his cardiac cath is scheduled with Dr.Smith for 9/6 @ 10am. Pt is to arrive at [facility] @ 8am. Pt pulmonologist ordered a Bmet today. Pt will also need a Cbc and Pt/Inr. Pt will come to our office on 9/1 to have labs drawn.  Pt is on Coumadin and has a hx of CVA listed on his dx list. Pt sts that he has been bridged with Lovenox in the past when Coumadin was held.  Adv pt that I will discuss with our Pharmacist/CVRR to determine if bridging is required and if so to coordinate bridging. Adv pt that either I or the coumadin clinic will call him back with further instruction on coumadin. Pt verbalized understanding.

## 2016-03-09 NOTE — Telephone Encounter (Signed)
Pt aware of Megan Supple, Pharm-D instructions. Pt will require bridging. Pt last dose of Coumadin should be on Thurs 8/31. Pt will need a coumadin appt on 9/1 to coordinate Lovenox bridging.  Pt aware Coumadin appt scheduled on 9/1 @ 8:15am. Pt will get preprocedure labs on the same day, and will also pick up written instructions on the sameday.

## 2016-03-09 NOTE — Telephone Encounter (Signed)
Pt scheduled for right heart cath on 03/16/16. Takes Coumadin for afib with CHADS2 score of 4 (HTN, HF, CVA). Pt will need to hold Coumadin for 5 days. Has been Lovenox bridged multiple times previously d/t hx of CVA. Pt will take last dose of Coumadin on 8/31 and come in Friday 9/1 for Lovenox bridging instructions.

## 2016-03-11 ENCOUNTER — Other Ambulatory Visit: Payer: Medicare Other | Admitting: *Deleted

## 2016-03-11 ENCOUNTER — Ambulatory Visit (INDEPENDENT_AMBULATORY_CARE_PROVIDER_SITE_OTHER): Payer: Medicare Other | Admitting: *Deleted

## 2016-03-11 DIAGNOSIS — Z7901 Long term (current) use of anticoagulants: Secondary | ICD-10-CM

## 2016-03-11 DIAGNOSIS — Z01812 Encounter for preprocedural laboratory examination: Secondary | ICD-10-CM

## 2016-03-11 DIAGNOSIS — I4891 Unspecified atrial fibrillation: Secondary | ICD-10-CM | POA: Diagnosis not present

## 2016-03-11 DIAGNOSIS — I6350 Cerebral infarction due to unspecified occlusion or stenosis of unspecified cerebral artery: Secondary | ICD-10-CM | POA: Diagnosis not present

## 2016-03-11 LAB — CBC WITH DIFFERENTIAL/PLATELET
BASOS PCT: 1 %
Basophils Absolute: 41 cells/uL (ref 0–200)
EOS PCT: 4 %
Eosinophils Absolute: 164 cells/uL (ref 15–500)
HEMATOCRIT: 47.5 % (ref 38.5–50.0)
Hemoglobin: 15.6 g/dL (ref 13.2–17.1)
LYMPHS PCT: 41 %
Lymphs Abs: 1681 cells/uL (ref 850–3900)
MCH: 29.6 pg (ref 27.0–33.0)
MCHC: 32.8 g/dL (ref 32.0–36.0)
MCV: 90.1 fL (ref 80.0–100.0)
MONOS PCT: 12 %
Monocytes Absolute: 492 cells/uL (ref 200–950)
NEUTROS PCT: 42 %
Neutro Abs: 1722 cells/uL (ref 1500–7800)
PLATELETS: 138 10*3/uL — AB (ref 140–400)
RBC: 5.27 MIL/uL (ref 4.20–5.80)
RDW: 19.9 % — AB (ref 11.0–15.0)
WBC: 4.1 10*3/uL (ref 3.8–10.8)

## 2016-03-11 LAB — POCT INR: INR: 2.6

## 2016-03-11 LAB — PROTIME-INR
INR: 2.4 — AB
PROTHROMBIN TIME: 24.5 s — AB (ref 9.0–11.5)

## 2016-03-11 MED ORDER — ENOXAPARIN SODIUM 80 MG/0.8ML ~~LOC~~ SOLN: 80.0000 mg | Freq: Two times a day (BID) | SUBCUTANEOUS | 1 refills | Status: DC

## 2016-03-11 NOTE — Patient Instructions (Addendum)
03/10/16: Last dose of Coumadin.  03/11/16: No Coumadin or Lovenox.  03/12/16: Inject Lovenox 61m in the fatty abdominal tissue at least 2 inches from the belly button twice a day about 12 hours apart, 8am and 8pm rotate sites. No Coumadin.  03/13/16: Inject Lovenox 873min the fatty tissue every 12 hours, 8am and 8pm. No Coumadin.  03/14/16: Inject Lovenox 8018mn the fatty tissue every 12 hours, 8am and 8pm. No Coumadin.  03/15/16: Inject Lovenox 20m58m the fatty tissue in the morning at 8 am (No PM dose). No Coumadin.  03/16/16: Procedure Day - No Lovenox - Resume Coumadin in the evening or as directed by doctor (take an extra half tablet with usual dose for 2 days then resume normal dose).  03/17/16: Resume Lovenox 20mg25mect in the fatty tissue every 12 hours and take Coumadin.  03/18/16: Inject Lovenox 20mg 39mhe fatty tissue every 12 hours and take Coumadin.  03/19/16: Inject Lovenox 20mg i58me fatty tissue every 12 hours and take Coumadin.  03/20/16: Inject Lovenox 20mg in66m fatty tissue every 12 hours and take Coumadin.  03/21/16: Inject Lovenox 20mg in 36mfatty tissue every 12 hours and take Coumadin.  03/22/16: Take morning dose of Lovenox & Coumadin appt to check INR.

## 2016-03-16 ENCOUNTER — Encounter (HOSPITAL_COMMUNITY): Payer: Self-pay | Admitting: Interventional Cardiology

## 2016-03-16 ENCOUNTER — Ambulatory Visit (HOSPITAL_COMMUNITY)
Admission: RE | Admit: 2016-03-16 | Discharge: 2016-03-16 | Disposition: A | Discharge status: Home / Self Care | Payer: Medicare Other | Source: Ambulatory Visit | Attending: Interventional Cardiology | Admitting: Interventional Cardiology

## 2016-03-16 ENCOUNTER — Encounter (HOSPITAL_COMMUNITY)
Admission: RE | Disposition: A | Discharge status: Home / Self Care | Payer: Self-pay | Source: Ambulatory Visit | Attending: Interventional Cardiology

## 2016-03-16 DIAGNOSIS — J84112 Idiopathic pulmonary fibrosis: Secondary | ICD-10-CM | POA: Insufficient documentation

## 2016-03-16 DIAGNOSIS — I110 Hypertensive heart disease with heart failure: Secondary | ICD-10-CM | POA: Diagnosis not present

## 2016-03-16 DIAGNOSIS — J9611 Chronic respiratory failure with hypoxia: Secondary | ICD-10-CM | POA: Diagnosis not present

## 2016-03-16 DIAGNOSIS — I4891 Unspecified atrial fibrillation: Secondary | ICD-10-CM | POA: Insufficient documentation

## 2016-03-16 DIAGNOSIS — Z7901 Long term (current) use of anticoagulants: Secondary | ICD-10-CM | POA: Insufficient documentation

## 2016-03-16 DIAGNOSIS — I2781 Cor pulmonale (chronic): Secondary | ICD-10-CM | POA: Diagnosis not present

## 2016-03-16 DIAGNOSIS — I272 Other secondary pulmonary hypertension: Secondary | ICD-10-CM | POA: Insufficient documentation

## 2016-03-16 DIAGNOSIS — J849 Interstitial pulmonary disease, unspecified: Secondary | ICD-10-CM | POA: Diagnosis present

## 2016-03-16 DIAGNOSIS — J449 Chronic obstructive pulmonary disease, unspecified: Secondary | ICD-10-CM | POA: Diagnosis not present

## 2016-03-16 DIAGNOSIS — Z8673 Personal history of transient ischemic attack (TIA), and cerebral infarction without residual deficits: Secondary | ICD-10-CM | POA: Diagnosis not present

## 2016-03-16 DIAGNOSIS — I5022 Chronic systolic (congestive) heart failure: Secondary | ICD-10-CM | POA: Diagnosis not present

## 2016-03-16 DIAGNOSIS — I2723 Pulmonary hypertension due to lung diseases and hypoxia: Secondary | ICD-10-CM

## 2016-03-16 DIAGNOSIS — Z87891 Personal history of nicotine dependence: Secondary | ICD-10-CM | POA: Insufficient documentation

## 2016-03-16 DIAGNOSIS — I2510 Atherosclerotic heart disease of native coronary artery without angina pectoris: Secondary | ICD-10-CM | POA: Diagnosis present

## 2016-03-16 DIAGNOSIS — I509 Heart failure, unspecified: Secondary | ICD-10-CM

## 2016-03-16 DIAGNOSIS — Z9981 Dependence on supplemental oxygen: Secondary | ICD-10-CM | POA: Diagnosis not present

## 2016-03-16 HISTORY — PX: CARDIAC CATHETERIZATION: SHX172

## 2016-03-16 LAB — POCT I-STAT 3, VENOUS BLOOD GAS (G3P V)
BICARBONATE: 26.8 mmol/L (ref 20.0–28.0)
Bicarbonate: 26.4 mmol/L (ref 20.0–28.0)
O2 SAT: 54 %
O2 Saturation: 62 %
PCO2 VEN: 49.8 mmHg (ref 44.0–60.0)
PH VEN: 7.337 (ref 7.250–7.430)
PO2 VEN: 31 mmHg — AB (ref 32.0–45.0)
PO2 VEN: 35 mmHg (ref 32.0–45.0)
TCO2: 28 mmol/L (ref 0–100)
TCO2: 28 mmol/L (ref 0–100)
pCO2, Ven: 50 mmHg (ref 44.0–60.0)
pH, Ven: 7.333 (ref 7.250–7.430)

## 2016-03-16 LAB — PROTIME-INR
INR: 1.05
Prothrombin Time: 13.8 seconds (ref 11.4–15.2)

## 2016-03-16 SURGERY — RIGHT HEART CATH
Anesthesia: LOCAL

## 2016-03-16 MED ORDER — ASPIRIN 81 MG PO CHEW
81.0000 mg | CHEWABLE_TABLET | ORAL | Status: AC
  Administered 2016-03-16: 81 mg via ORAL

## 2016-03-16 MED ORDER — FENTANYL CITRATE (PF) 100 MCG/2ML IJ SOLN
INTRAMUSCULAR | Status: DC | PRN
  Administered 2016-03-16: 50 ug via INTRAVENOUS

## 2016-03-16 MED ORDER — SODIUM CHLORIDE 0.9% FLUSH: 3.0000 mL | Freq: Two times a day (BID) | INTRAVENOUS | Status: DC

## 2016-03-16 MED ORDER — ASPIRIN 81 MG PO CHEW
CHEWABLE_TABLET | ORAL | Status: AC
  Filled 2016-03-16: qty 1

## 2016-03-16 MED ORDER — HEPARIN (PORCINE) IN NACL 2-0.9 UNIT/ML-% IJ SOLN
INTRAMUSCULAR | Status: DC | PRN
  Administered 2016-03-16: 500 mL

## 2016-03-16 MED ORDER — LIDOCAINE HCL (PF) 1 % IJ SOLN
INTRAMUSCULAR | Status: DC | PRN
  Administered 2016-03-16: 3 mL via INTRADERMAL

## 2016-03-16 MED ORDER — LIDOCAINE HCL (PF) 1 % IJ SOLN
INTRAMUSCULAR | Status: AC
  Filled 2016-03-16: qty 30

## 2016-03-16 MED ORDER — SODIUM CHLORIDE 0.9 % IV SOLN: 250.0000 mL | INTRAVENOUS | Status: DC | PRN

## 2016-03-16 MED ORDER — SODIUM CHLORIDE 0.9% FLUSH: 3.0000 mL | INTRAVENOUS | Status: DC | PRN

## 2016-03-16 MED ORDER — SODIUM CHLORIDE 0.9 % IV SOLN
INTRAVENOUS | Status: DC
  Administered 2016-03-16: 08:00:00 via INTRAVENOUS

## 2016-03-16 MED ORDER — HEPARIN (PORCINE) IN NACL 2-0.9 UNIT/ML-% IJ SOLN
INTRAMUSCULAR | Status: AC
Start: 2016-03-16 — End: 2016-03-16
  Filled 2016-03-16: qty 1000

## 2016-03-16 MED ORDER — FENTANYL CITRATE (PF) 100 MCG/2ML IJ SOLN
INTRAMUSCULAR | Status: AC
  Filled 2016-03-16: qty 2

## 2016-03-16 SURGICAL SUPPLY — 8 items
CATH BALLN WEDGE 5F 110CM (CATHETERS) ×1 IMPLANT
PACK CARDIAC CATHETERIZATION (CUSTOM PROCEDURE TRAY) ×1 IMPLANT
PROTECTION STATION PRESSURIZED (MISCELLANEOUS) ×2
SHEATH FAST CATH BRACH 5F 5CM (SHEATH) ×1 IMPLANT
STATION PROTECTION PRESSURIZED (MISCELLANEOUS) IMPLANT
TRANSDUCER W/STOPCOCK (MISCELLANEOUS) ×1 IMPLANT
TUBING ART PRESS 72  MALE/FEM (TUBING) ×1
TUBING ART PRESS 72 MALE/FEM (TUBING) IMPLANT

## 2016-03-16 NOTE — Discharge Instructions (Signed)
CALL DR OFFICE IF ANY PROBLEMS,QUESTIONS, OR CONCERNS; CALL IF ANY REDNESS,DRAINAGE,FEVER,PAIN,OR SWELLING AT RIGHT ARM SITE

## 2016-03-16 NOTE — H&P (View-Only) (Signed)
Subjective:     Patient ID: Patrick Weiss, male   DOB: Aug 30, 1940, 75 y.o.   MRN: [id number]  HPI   OV 09/07/2015  Chief Complaint  Patient presents with  . Follow-up    Pt does not feel that Esbriet is helping much. Pt uses 2.5-4Liters O2 per/min.    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 This is associated with cor pulmonale and also chronic systolic heart failure ejection fraction 45% in February 2016. He is on anti-fibrotic therapy Esbriet since approximately November 2016.   He presents for follow-up with his wife. Overall he feels that the drug might not be helping him. He understands that the anti-fibrotic therapies for prevention and is not necessarily designed to make him feel better. Wife reports that he had a low appetite initially after starting the drug but since then his appetite is picked back up to normal. However he is having fatigue that is significantly worse after starting the drug. It is moderate in intensity. His oxygen level uses around the same. He is also reporting significant bilateral pedal edema for which she has seen Dr. Pernell Dupre. Most recent chemistries reveal a BNP of 750 and a creatinine of 1.62 mg percent. The creatinine is slightly higher than December 2016 when it was only 1.5 mg percent. His last liver function test was in December 2016 and it was normal. He is due for repeat liver function test today. Off note, he is not ideal candidate for Ofev due to anticoagulation  He is also reporting changes in his sleep pattern and is wondering if it's because of IPF anti-fibrotic therapy   OV 12/03/2015  Chief Complaint  Patient presents with  . Follow-up    Pt c/o continued SOB. He is on 1L O2 with POC when ambulating and 4L O2 via POC when at home. Pt's O2 in room was 67 on POC @ 1L. Pt also c/o cough with mostly clear and occasional wheeze/chest tightness.  Pt denies CP.      Follow-up idiopathic pulmonary fibrosis on Pirfenidone (Esbriet) 2 tablets 3 times a day  He had issues with higher dose of Pirfenidone (Esbriet) but he is tolerating the lower dose 2 tablets 3 times daily without problems. He did get admitted in April 2017 with congestive heart failure according to his history. He did have a chest x-ray with that I reviewed the results. He also had blood work that showed mild renal insufficiency but normal liver function tests. I visualize these results myself. At this point in time he feels overall he is stable. He is trying to get into an exercise program at [facility]. He is more sedentary. He is using 1 L of oxygen at rest with 4 L at exertion. Today at rest on room air for 20 minutes his pulse ox of 82%. When he walks from the waiting area to our office room on 1 L oxygen he dropped a 67%. He says his most recent admission he had significant pedal edema and he was diuresed. He still has significant pedal edema.f note he is wondering why he should apply sunscreen with IPF. He says that he never told him that. Apparently the booklets from [place] tell him that.   OV 02/24/2016  Chief Complaint  Patient presents with  . Follow-up    Pt states his breathing is unchanged since last OV. Pt c/o prod cough with clear to yellow mucus in morning.  FU IPF/chronic hypoxemic resp failure - on Pirfenidone (Esbriet) 2 tablets 3 times a day. He had lung function test today. His FVC is 2.99 L in approximately 5% on compared to one year ago. His DLCO is 5.77/17% and is approximately 25% on compared to one year ago. He does not feel his difference. He uses between 4 and 5 L oxygen and gets around and does all his activities of daily living is fine. He has worsening pedal edema. He saw a dermatologist yesterday and has some skin excoriation in his shin. His dermatologist as advised him that this is not due to Pirfenidone (Esbriet). In review of his oxygenation 4L  [place] at rest but  5 min after rooming in 85% -> after stoppoing talking -> 90% and I think this is worse from before  Labs shows rising creat 1.46 in may 2017 -> 1.46m% July 2017. He is noted to be on Lasix and Aldactone  Echocardiogram February 2016 shows elevated pulmonary artery systolic pressure of 60 mmHg  Off note he is being followed as a research but this event in the IPF-pro Registry noninterventional trial (only intervention being questionnaire and lab draws]. This is standard of care visit'  Also of note is noticed to be on Flovent and Spiriva. Unclear to me why. Last visit with Dr. MLegrand Comowert he was asked to stop both inhalers but he says the Flovent helps him. The Spiriva does not and therefore he is requesting this to be stopped   has a past medical history of A-fib ([facility]; CHF (congestive heart failure) ([place]; Hypertension; Pulmonary fibrosis ([facility]; Shortness of breath dyspnea; and Stroke ([place].   reports that he quit smoking about 24 years ago. His smoking use included Cigarettes. He has a 30.00 pack-year smoking history. He has never used smokeless tobacco.  Past Surgical History:  Procedure Laterality Date  . BACK SURGERY  2001  . CARDIAC CATHETERIZATION N/A 04/28/2015   Procedure: Right/Left Heart Cath and Coronary Angiography;  Surgeon: HBelva Crome MD;  [facility];  Service: Cardiovascular;  Laterality: N/A;  . TIBIA FRACTURE SURGERY  1975    No Known Allergies  Immunization History  Administered Date(s) Administered  . Influenza Split 03/11/2014  . Influenza,inj,Quad PF,36+ Mos 03/16/2015    Family History  Problem Relation Age of Onset  . Cancer Father      Current Outpatient Prescriptions:  .  allopurinol (ZYLOPRIM) 100 MG tablet, Take 100 mg by mouth daily., Disp: , Rfl:  .  atorvastatin (LIPITOR) 10 MG tablet, Take 1 tablet (10 mg total) by mouth daily., Disp: 90 tablet, Rfl: 3 .  Calcium Carbonate-Vitamin D (CALCIUM 600+D) 600-200  MG-UNIT TABS, Take 1 tablet by mouth 2 (two) times daily., Disp: , Rfl:  .  Carboxymethylcellulose Sodium (REFRESH TEARS OP), Place 1 drop into both eyes daily as needed (dry eyes). , Disp: , Rfl:  .  fexofenadine (ALLEGRA) 180 MG tablet, Take 180 mg by mouth daily as needed for allergies. , Disp: , Rfl:  .  fluticasone (FLOVENT HFA) 44 MCG/ACT inhaler, Inhale 2 puffs into the lungs 2 (two) times daily., Disp: 1 Inhaler, Rfl: 12 .  furosemide (LASIX) 40 MG tablet, Take 1 tablet by mouth at bedtime., Disp: , Rfl: 3 .  furosemide (LASIX) 80 MG tablet, Take 80 mg by mouth daily., Disp: , Rfl:  .  NON FORMULARY, Place 4 L into the nose continuous. , Disp: , Rfl:  .  Pirfenidone 267 MG CAPS, Take  2 capsules by mouth 3 (three) times daily. , Disp: , Rfl:  .  Spacer/Aero-Holding Chambers (AEROCHAMBER MV) inhaler, Use as instructed, Disp: 1 each, Rfl: 0 .  spironolactone (ALDACTONE) 25 MG tablet, Take 1 tablet (25 mg total) by mouth daily., Disp: 30 tablet, Rfl: 11 .  triamcinolone cream (KENALOG) 0.1 %, Apply 1 application topically 2 (two) times daily., Disp: , Rfl:  .  valsartan (DIOVAN) 160 MG tablet, Take 160 mg by mouth daily., Disp: , Rfl:  .  warfarin (COUMADIN) 5 MG tablet, TAKE AS DIRECTED, Disp: 120 tablet, Rfl: 1 .  SPIRIVA RESPIMAT 2.5 MCG/ACT AERS, INHALE 2 PUFFS INTO THE LUNGS DAILY (Patient not taking: Reported on 02/24/2016), Disp: 4 g, Rfl: 5   Review of Systems     Objective:   Physical Exam  Constitutional: He is oriented to person, place, and time. He appears well-developed and well-nourished. No distress.  HENT:  Head: Normocephalic and atraumatic.  Right Ear: External ear normal.  Left Ear: External ear normal.  Mouth/Throat: Oropharynx is clear and moist. No oropharyngeal exudate.  OXYGEN on  Eyes: Conjunctivae and EOM are normal. Pupils are equal, round, and reactive to light. Right eye exhibits no discharge. Left eye exhibits no discharge. No scleral icterus.  Neck:  Normal range of motion. Neck supple. No JVD present. No tracheal deviation present. No thyromegaly present.  Cardiovascular: Normal rate, regular rhythm and intact distal pulses.  Exam reveals no gallop and no friction rub.   No murmur heard. Pulmonary/Chest: Effort normal. No respiratory distress. He has no wheezes. He has rales. He exhibits no tenderness.  Abdominal: Soft. Bowel sounds are normal. He exhibits no distension and no mass. There is no tenderness. There is no rebound and no guarding.  Musculoskeletal: Normal range of motion. He exhibits edema. He exhibits no tenderness.  ++ edema  Lymphadenopathy:    He has no cervical adenopathy.  Neurological: He is alert and oriented to person, place, and time. He has normal reflexes. No cranial nerve deficit. Coordination normal.  Skin: Skin is warm and dry. No rash noted. He is not diaphoretic. No erythema. No pallor.  Psychiatric: He has a normal mood and affect. His behavior is normal. Judgment and thought content normal.  Nursing note and vitals reviewed.   Vitals:   02/24/16 1000 02/24/16 1006  BP:  (!) 102/58  Pulse: 97 89  SpO2: (!) 85% 90%  Weight:  180 lb (81.6 kg)  Height:  _0  (1.778 m)        Assessment:       ICD-9-CM ICD-10-CM   1. Chronic hypoxemic respiratory failure (HCC) 518.83 J96.11    799.02    2. IPF (idiopathic pulmonary fibrosis) (HCC) 516.31 J84.112   3. Cor pulmonale, chronic (HCC) 416.9 I27.81   4. Encounter for therapeutic drug monitoring V58.83 Z51.81        Plan:       IPF is worse 5 to 20% range compared to a year ago Some of this can be worsening pulmonary hypertension called cor pulmonale due to stress on right hear  Plan = dc spiriva  - ok for flovent per your choice - Check chemistry renal function and liver function - Continue Pirfenidone (Esbriet) 2 tablets 3 times daily; there is a change in the lab and we will let you know and adjust the dose accordingly - Ensure sunscreen at  all times - Recommend right heart catheterization - will send message to Dr. Pernell Dupre -  Meet with the research coordinator for your IPF-pro-registry study -  flu shot in the fall   Follow up  - 2 months or sooner if needed      > 50% of this > 25 min visit spent in face to face counseling or coordination of care    Dr. Brand Males, M.D., [facility].C.P Pulmonary and Critical Care Medicine Staff Physician [place] [facility] Critical [facility] Pager: [phone], If no answer or between  15:00h - 7:00h: call [phone]  02/24/2016 5:45 PM

## 2016-03-16 NOTE — Interval H&P Note (Signed)
History and Physical Interval Note:  03/16/2016 8:00 AM The patient is referred by Dr. Chase Caller for documentation of pulmonary pressures. He has O2 dependent COPD/ILD. Chest is without wheezes. Breath sounds are decreased,. Moderated lower extremity edema is noted.  Planned procedure is right heart cath via brachial antecubital access.  Patrick Weiss  has presented today for surgery, with the diagnosis of dysphnea, respiratory abnormalty  The various methods of treatment have been discussed with the patient and family. After consideration of risks, benefits and other options for treatment, the patient has consented to  Procedure(s): Right Heart Cath (N/A) as a surgical intervention .  The patient's history has been reviewed, patient examined, no change in status, stable for surgery.  I have reviewed the patient's chart and labs.  Questions were answered to the patient's satisfaction.     Belva Crome III

## 2016-03-18 NOTE — Progress Notes (Signed)
lmtcb for pt.

## 2016-03-21 NOTE — Telephone Encounter (Signed)
Rec'd message from answering service- pt returning out call. He can be reached at [phone]

## 2016-03-21 NOTE — Telephone Encounter (Signed)
Spoke with pt. He has already completed the right heart cath. Pt would like the results of this procedure.  MR- please advise. Thanks.

## 2016-03-21 NOTE — Telephone Encounter (Signed)
Patient is returning call, please CB on cell at [phone]

## 2016-03-21 NOTE — Progress Notes (Signed)
lmtcb x1

## 2016-03-22 ENCOUNTER — Ambulatory Visit (INDEPENDENT_AMBULATORY_CARE_PROVIDER_SITE_OTHER): Payer: Medicare Other | Admitting: Pharmacist

## 2016-03-22 ENCOUNTER — Encounter: Payer: Self-pay | Admitting: Pulmonary Disease

## 2016-03-22 ENCOUNTER — Ambulatory Visit (INDEPENDENT_AMBULATORY_CARE_PROVIDER_SITE_OTHER): Payer: Medicare Other | Admitting: Pulmonary Disease

## 2016-03-22 DIAGNOSIS — I2723 Pulmonary hypertension due to lung diseases and hypoxia: Secondary | ICD-10-CM

## 2016-03-22 DIAGNOSIS — Z7901 Long term (current) use of anticoagulants: Secondary | ICD-10-CM

## 2016-03-22 DIAGNOSIS — J849 Interstitial pulmonary disease, unspecified: Secondary | ICD-10-CM

## 2016-03-22 DIAGNOSIS — J431 Panlobular emphysema: Secondary | ICD-10-CM | POA: Diagnosis not present

## 2016-03-22 DIAGNOSIS — I4891 Unspecified atrial fibrillation: Secondary | ICD-10-CM | POA: Diagnosis not present

## 2016-03-22 DIAGNOSIS — I6350 Cerebral infarction due to unspecified occlusion or stenosis of unspecified cerebral artery: Secondary | ICD-10-CM | POA: Diagnosis not present

## 2016-03-22 DIAGNOSIS — I272 Other secondary pulmonary hypertension: Secondary | ICD-10-CM | POA: Diagnosis not present

## 2016-03-22 LAB — POCT INR: INR: 1.9

## 2016-03-22 MED ORDER — UMECLIDINIUM BROMIDE 62.5 MCG/INH IN AEPB: 1.0000 | INHALATION_SPRAY | Freq: Every day | RESPIRATORY_TRACT | 0 refills | Status: DC

## 2016-03-22 NOTE — Telephone Encounter (Signed)
He has severe pulmonary hypertension - this is contributing to his dyspnea. Please give him appt to see Dr [place] Bells for pulm htn   Dr. Brand Males, M.D., [facility].C.P Pulmonary and Critical Care Medicine Staff Physician [place] [facility]monary and Critical Care Pager: [phone], If no answer or between  15:00h - 7:00h: call [phone]  03/22/2016 6:21 AM

## 2016-03-22 NOTE — Telephone Encounter (Signed)
Spoke with pt and gave results per MR and recommendation to see McQuaid. Pt scheduled appt today @ 4:30pm Nothing further needed.

## 2016-03-22 NOTE — Progress Notes (Signed)
Called and spoke to pt. Informed him of the results per MR. Pt verbalized understanding and denied any further questions or concerns at this time.  

## 2016-03-22 NOTE — Assessment & Plan Note (Signed)
Per Dr. Chase Caller

## 2016-03-22 NOTE — Telephone Encounter (Signed)
Patient returning our call - can be reached at [phone]

## 2016-03-22 NOTE — Progress Notes (Signed)
Subjective:    Patient ID: Patrick Weiss, male    DOB: 1940/10/19, 75 y.o.   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  HPI Chief Complaint  Patient presents with  . Follow-up    office visit requested by MR to follow up on pulm htn.  pt c/o sob with exertion, prod cough with yellow/clear mucus.     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 He has a past medical history significant for COPD and smoking. He is currently taking Esbriet for his IPF. Over the years he has also been treated with bronchodilators and other inhaled therapies for his COPD. Most recently he has experienced some cough with mucus production and he requested that his physician stop Spiriva and use Flovent alone. He still had some mucus production but he says he likes the Flovent better than other treatments.  In the last year he's not been very active. He doesn't exercise routinely. He treats this somewhat to his lung disease but also to a period of time where he was unable to get a portable oxygen concentrator. He said significant leg swelling in the last year which is been treated with diuretic therapy. This has improved significantly in the last few weeks as his diuretics have been adjusted by his cardiologist and his primary care physician. The leg swelling has persisted but he's lost about 12 pounds in knees feeling better now.  Swelling> weight now down 12 pounds post taking lasix > swellign better > this helped his exercise capacity considerably, he also noticed that his oxygen level rebounded more  Emphysema/COPD> coughs up mucus in the mornings, he didn't think that Spiriva didn't help much and made hi choke some.  Flovent seems to work better.  Doesn't take any other inahled medicines.  Likes DPI form of  inhaled medicines.    He has IPF and has bene treated with pirfenidone.   Past Medical History:  Diagnosis Date  . A-fib (HCC)    paroxysmal  . CHF (congestive heart failure) (Patrick Weiss)   . Hypertension   . Pulmonary fibrosis ([place])   . Shortness of breath dyspnea   . 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 Negative for chills and fever.  HENT: Negative for postnasal drip, rhinorrhea and sinus pressure.   Respiratory: Positive for cough and shortness of breath. Negative for wheezing.   Cardiovascular: Positive for leg swelling. Negative for chest pain and palpitations.       Objective:   Physical Exam Vitals:   03/22/16 1651  BP: 126/74  Pulse: 94  SpO2: 93%  Weight: 178 lb 3.2 oz (80.8 kg)  Height: _0  (1.778 m)  2 L [place]  Gen: chronically ill appearing, no acute distress HENT: NCAT, OP clear, neck supple without masses Eyes: PERRL, EOMi Lymph: no cervical lymphadenopathy PULM: Crackles bases, no wheezing  CV: RRR, loud P2, JVD up, massive edema legs bilaterally GI: BS+, soft, nontender, no hsm Derm: no rash or skin breakdown MSK: normal bulk and tone Neuro: A&Ox4, CN II-XII intact, strength 5/5 in all 4 extremities Psyche: normal mood and affect    August 2017 pulmonary function testing FVC 3.01  L, 79% predicted, FVC 2.06 L 73% predicted, clear airflow obstruction, total lung capacity 5.22 L 74% predicted, DLCO 6.2 19% predicted (Both FVC and DLCO have declined since 2016)  Right heart catheterization September 2017 PA pressure mean 44-47, pulmonary capillary wedge pressure 5, cardiac output 3.06 L/m, RA mean 14 mmHg  2016 CT chest images personally reviewed showing massive bullous emphysema and severe centrilobular emphysema in the upper lobes with fibrotic changes in the bases consistent with usual interstitial pneumonitis       Assessment & Plan:  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  His pulmonary hypertension is quite significant as demonstrated on his right heart catheterization recently. He is clearly a Lexicographer group 3 patient as he has advanced lung disease which explains his symptoms. It would be exceedingly unlikely for him to have Patrick Weiss group 1 disease in addition to his advanced emphysema and idiopathic pulmonary fibrosis.  The majority of the medical literature has suggested that patients with [place] group 3 disease do not benefit from pulmonary vasodilator therapy. There are some small reports and studies published here and there looking at this topic, but the consensus recommendations at this point remain that these patients not benefit from pulmonary vasodilators.   Anecdotally some patients with more connective tissue disease type presentation or a disproportionate worsening of pulmonary pressures despite relative stability of underlying lung disease seem to do a bit better when treated with pulmonary vasodilators.  In the last year we have clear evidence that his lung disease has progressed in that his DLCO and his FVC have both declined.  Plan: I like to try to maximize treatment of his COPD and improve his volume status: Add Incruise, sample given Increase p.m. dose of Lasix to 80 mg for the next several days, I have had him measure his weight on a daily basis and report back to [place] if this helps with his leg swelling After 3 days of taking the increased dose of Lasix have asked him to go back to his current regimen which includes parental lactone and twice a day Lasix Exercise regularly over the next month 6 minute walk next visit We will talk again about whether or not trying  an empiric trial of pulmonary vasodilators would be helpful on the next visit, but at this time I don't think that would be beneficial as there is no evidence in the literature to suggest that this is helpful.  > 50% of today's 50 minute visit spent face to face  ILD (interstitial lung disease) ([place]) Per Dr. Chase Caller  COPD (chronic obstructive pulmonary disease) ([place]) Ad Incruise Continue Flovent Continue oxygen Encouraged exercise    Current Outpatient Prescriptions:  .  allopurinol (ZYLOPRIM) 100 MG tablet, Take 100 mg by mouth daily., Disp: , Rfl:  .  atorvastatin (LIPITOR) 10 MG tablet, Take 1 tablet (10 mg total) by mouth daily., Disp: 90 tablet, Rfl: 3 .  Calcium Carbonate-Vitamin D (CALCIUM 600+D) 600-200 MG-UNIT TABS, Take 1 tablet by mouth 2 (two) times daily., Disp: , Rfl:  .  Carboxymethylcellulose Sodium (REFRESH TEARS OP), Place 1 drop into both eyes daily as needed (dry eyes). , Disp: , Rfl:  .  enoxaparin (LOVENOX) 80 MG/0.8ML injection, Inject 0.8 mLs (80 mg total) into the skin every 12 (twelve) hours., Disp: 20 Syringe, Rfl: 1 .  fexofenadine (ALLEGRA) 180 MG tablet, Take 180 mg by mouth daily as needed for allergies. , Disp: , Rfl:  .  fluticasone (FLOVENT HFA) 44 MCG/ACT inhaler, Inhale 2 puffs into the lungs 2 (two) times daily., Disp: 1 Inhaler, Rfl: 12 .  furosemide (LASIX) 40 MG tablet, Take 1 tablet by mouth at bedtime., Disp: , Rfl: 3 .  furosemide (LASIX) 80 MG tablet, Take 80 mg by mouth daily., Disp: , Rfl:  .  OXYGEN, Inhale 3-4 L into the lungs continuous., Disp: , Rfl:  .  Pirfenidone 267 MG CAPS, Take 2 capsules by mouth 3 (three) times daily. , Disp: , Rfl:  .  spironolactone (ALDACTONE) 25 MG tablet, Take 1 tablet (25 mg total) by mouth daily., Disp: 30 tablet, Rfl: 11 .  triamcinolone cream (KENALOG) 0.1 %, Apply 1 application topically 2 (two) times daily., Disp: , Rfl:  .  valsartan (DIOVAN) 160 MG tablet, Take 160 mg by mouth daily., Disp: , Rfl:    .  warfarin (COUMADIN) 5 MG tablet, TAKE AS DIRECTED, Disp: 120 tablet, Rfl: 1 .  umeclidinium bromide (INCRUSE ELLIPTA) 62.5 MCG/INH AEPB, Inhale 1 puff into the lungs daily., Disp: 2 each, Rfl: 0

## 2016-03-22 NOTE — Patient Instructions (Signed)
Take the Incruise daily no matter how you feel Keep taking the Flovent  For the next three days: Take spironolactone 33m and lasix 884min the morning About six hours later take another dose of lasix 8089m After three days, go back to taking your medicines as you are doing  Weigh yourself daily Exercise regularly, three days per week for the next four weeks  We will see you back in 4 weeks or sooner if needed

## 2016-03-22 NOTE — Assessment & Plan Note (Signed)
Patrick Weiss was referred to me today as a second opinion on pulmonary hypertension in the setting of advanced COPD and idiopathic pulmonary fibrosis.  His pulmonary hypertension is quite significant as demonstrated on his right heart catheterization recently. He is clearly a Lexicographer group 3 patient as he has advanced lung disease which explains his symptoms. It would be exceedingly unlikely for him to have [place] group 1 disease in addition to his advanced emphysema and idiopathic pulmonary fibrosis.  The majority of the medical literature has suggested that patients with [place] group 3 disease do not benefit from pulmonary vasodilator therapy. There are some small reports and studies published here and there looking at this topic, but the consensus recommendations at this point remain that these patients not benefit from pulmonary vasodilators.   Anecdotally some patients with more connective tissue disease type presentation or a disproportionate worsening of pulmonary pressures despite relative stability of underlying lung disease seem to do a bit better when treated with pulmonary vasodilators.  In the last year we have clear evidence that his lung disease has progressed in that his DLCO and his FVC have both declined.  Plan: I like to try to maximize treatment of his COPD and improve his volume status: Add Incruise, sample given Increase p.m. dose of Lasix to 80 mg for the next several days, I have had him measure his weight on a daily basis and report back to [place] if this helps with his leg swelling After 3 days of taking the increased dose of Lasix have asked him to go back to his current regimen which includes parental lactone and twice a day Lasix Exercise regularly over the next month 6 minute walk next visit We will talk again about whether or not trying an empiric trial of pulmonary vasodilators would be helpful on the next visit, but at  this time I don't think that would be beneficial as there is no evidence in the literature to suggest that this is helpful.  > 50% of today's 50 minute visit spent face to face

## 2016-03-22 NOTE — Assessment & Plan Note (Signed)
Ad Incruise Continue Flovent Continue oxygen Encouraged exercise

## 2016-03-22 NOTE — Telephone Encounter (Signed)
LM x 1 

## 2016-03-23 ENCOUNTER — Other Ambulatory Visit: Payer: Self-pay | Admitting: Internal Medicine

## 2016-03-25 ENCOUNTER — Other Ambulatory Visit: Payer: Self-pay | Admitting: Internal Medicine

## 2016-03-25 MED ORDER — FLUTICASONE PROPIONATE HFA 44 MCG/ACT IN AERO: 2.0000 | INHALATION_SPRAY | Freq: Two times a day (BID) | RESPIRATORY_TRACT | 5 refills | Status: AC

## 2016-03-31 ENCOUNTER — Ambulatory Visit (INDEPENDENT_AMBULATORY_CARE_PROVIDER_SITE_OTHER): Payer: Medicare Other | Admitting: Pharmacist

## 2016-03-31 DIAGNOSIS — I6350 Cerebral infarction due to unspecified occlusion or stenosis of unspecified cerebral artery: Secondary | ICD-10-CM

## 2016-03-31 DIAGNOSIS — I4891 Unspecified atrial fibrillation: Secondary | ICD-10-CM

## 2016-03-31 DIAGNOSIS — Z7901 Long term (current) use of anticoagulants: Secondary | ICD-10-CM | POA: Diagnosis not present

## 2016-03-31 LAB — POCT INR: INR: 1.9

## 2016-04-04 ENCOUNTER — Telehealth: Payer: Self-pay | Admitting: Internal Medicine

## 2016-04-04 NOTE — Telephone Encounter (Signed)
Form has been placed in MR's look at. Message will be routed to [facility] for follow up.

## 2016-04-04 NOTE — Telephone Encounter (Signed)
This has been received and once form has been completed I will update pt's chart.

## 2016-04-07 NOTE — Telephone Encounter (Signed)
Awaiting MR's return to office on 04/11/16

## 2016-04-11 NOTE — Telephone Encounter (Signed)
Form faxed to [facility] [employer]. LMTCB for pt to see if he would like to pick up form or if he would like his copy mailed.

## 2016-04-12 NOTE — Telephone Encounter (Signed)
Pt's copy placed in outgoing mail, original copy in MR's scan folder. Nothing further needed at this time.

## 2016-04-12 NOTE — Telephone Encounter (Signed)
Pt returning call.Patrick Weiss ° °

## 2016-04-12 NOTE — Telephone Encounter (Signed)
Spoke with pt. He would like a copy of this mailed to him. Will route message to Daneil Dan to make her aware.

## 2016-04-14 ENCOUNTER — Ambulatory Visit (INDEPENDENT_AMBULATORY_CARE_PROVIDER_SITE_OTHER): Payer: Medicare Other | Admitting: *Deleted

## 2016-04-14 DIAGNOSIS — Z7901 Long term (current) use of anticoagulants: Secondary | ICD-10-CM | POA: Diagnosis not present

## 2016-04-14 DIAGNOSIS — I6350 Cerebral infarction due to unspecified occlusion or stenosis of unspecified cerebral artery: Secondary | ICD-10-CM

## 2016-04-14 DIAGNOSIS — I4891 Unspecified atrial fibrillation: Secondary | ICD-10-CM | POA: Diagnosis not present

## 2016-04-14 LAB — POCT INR: INR: 2

## 2016-04-21 ENCOUNTER — Encounter: Payer: Self-pay | Admitting: Pulmonary Disease

## 2016-04-21 ENCOUNTER — Ambulatory Visit (INDEPENDENT_AMBULATORY_CARE_PROVIDER_SITE_OTHER): Payer: Medicare Other | Admitting: Pulmonary Disease

## 2016-04-21 ENCOUNTER — Other Ambulatory Visit: Payer: Medicare Other

## 2016-04-21 VITALS — BP 126/74 | HR 87 | Ht 70.0 in | Wt 174.0 lb

## 2016-04-21 DIAGNOSIS — I6350 Cerebral infarction due to unspecified occlusion or stenosis of unspecified cerebral artery: Secondary | ICD-10-CM

## 2016-04-21 DIAGNOSIS — J430 Unilateral pulmonary emphysema [MacLeod's syndrome]: Secondary | ICD-10-CM

## 2016-04-21 DIAGNOSIS — J849 Interstitial pulmonary disease, unspecified: Secondary | ICD-10-CM

## 2016-04-21 DIAGNOSIS — I2723 Pulmonary hypertension due to lung diseases and hypoxia: Secondary | ICD-10-CM | POA: Diagnosis not present

## 2016-04-21 DIAGNOSIS — J84112 Idiopathic pulmonary fibrosis: Secondary | ICD-10-CM

## 2016-04-21 DIAGNOSIS — R7989 Other specified abnormal findings of blood chemistry: Secondary | ICD-10-CM

## 2016-04-21 NOTE — Assessment & Plan Note (Signed)
He will continue follow-up with Dr. Chase Caller for his idiopathic pulmonary fibrosis. He will continue pirfenidone.

## 2016-04-21 NOTE — Progress Notes (Signed)
Subjective:    Patient ID: Patrick Weiss, male    DOB: 01-02-1941, 75 y.o.   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  HPI Chief Complaint  Patient presents with  . Follow-up    pt c/o some DOE, prod cough with yellow to clear mucus as day progresses.    Patrick Weiss is doing ok.  He took the inhaler I gave him last time and he says it really helped his breathing.  He said that it helped his breathing.  He took it in the early morning and this went well for him.  He continues to take the flovent bid.  His cough is at baseline.  He continues to produce some mucus in th emornings. His leg swelling improved as well.  His weight is hovering around 166 pounds on his home scale first thing in the morning.     His weight is down 4 pounds today compared to the last visit.     Past Medical History:  Diagnosis Date  . A-fib (HCC)    paroxysmal  . CHF (congestive heart failure) ([place])   . Hypertension   . Pulmonary fibrosis ([place])   . Shortness of breath dyspnea   . 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 Negative for chills and fever.  HENT: Negative for postnasal drip, rhinorrhea and sinus pressure.   Respiratory: Positive for cough and shortness of breath. Negative for wheezing.   Cardiovascular: Positive for leg swelling. Negative for chest pain and palpitations.       Objective:   Physical Exam Vitals:   04/21/16 1201  BP: 126/74  Pulse: 87  SpO2: 90%  Weight: 174 lb (78.9 kg)  Height: _0  (1.778 m)  2 L [place]  Gen: well appearing HENT: OP clear,, neck supple PULM: Crackles bases B, normal percussion CV: RRR, no mgr, trace edema improved GI: BS+, soft, nontender Derm: no cyanosis or rash Psyche: normal mood and affect    August 2017  pulmonary function testing FVC 3.01 L, 79% predicted, FVC 2.06 L 73% predicted, clear airflow obstruction, total lung capacity 5.22 L 74% predicted, DLCO 6.2 19% predicted (Both FVC and DLCO have declined since 2016)  Right heart catheterization September 2017 PA pressure mean 44-47, pulmonary capillary wedge pressure 5, cardiac output 3.06 L/m, RA mean 14 mmHg  2016 CT chest images personally reviewed showing massive bullous emphysema and severe centrilobular emphysema in the upper lobes with fibrotic changes in the bases consistent with usual interstitial pneumonitis     Assessment & Plan:  COPD (chronic obstructive pulmonary disease) ([place]) He has done well with the addition of Incruise. This has helped his breathing. Further, he continues to diurese and an appropriate rate as his weight is down 4 pounds today a think both of these are contributing to improvement in his shortness of breath.  Plan: Continue Incruise and Flovent Advised to rinse his mouth after using inhaled medicines Flu shot is up-to-date  ILD (interstitial lung disease) ([place]) He will continue follow-up with Dr. Chase Caller for his idiopathic pulmonary fibrosis. He will continue pirfenidone.  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 With slow diuresis he seeing improvement. There is no indication for a pulmonary  vasodilator.  Plan: Continue lasix Check pro-bnp today and next visit    Current Outpatient Prescriptions:  .  allopurinol (ZYLOPRIM) 100 MG tablet, Take 100 mg by mouth daily., Disp: , Rfl:  .  atorvastatin (LIPITOR) 10 MG tablet, Take 1 tablet (10 mg total) by mouth daily., Disp: 90 tablet, Rfl: 3 .  Calcium Carbonate-Vitamin D (CALCIUM 600+D) 600-200 MG-UNIT TABS, Take 1 tablet by mouth 2 (two) times daily., Disp: , Rfl:  .  Carboxymethylcellulose Sodium (REFRESH TEARS OP), Place 1 drop into both eyes daily as needed (dry eyes). , Disp:  , Rfl:  .  fexofenadine (ALLEGRA) 180 MG tablet, Take 180 mg by mouth daily as needed for allergies. , Disp: , Rfl:  .  fluticasone (FLOVENT HFA) 44 MCG/ACT inhaler, Inhale 2 puffs into the lungs 2 (two) times daily., Disp: 10.6 g, Rfl: 5 .  furosemide (LASIX) 40 MG tablet, Take 1 tablet by mouth at bedtime., Disp: , Rfl: 3 .  furosemide (LASIX) 80 MG tablet, Take 80 mg by mouth daily., Disp: , Rfl:  .  OXYGEN, Inhale 3-4 L into the lungs continuous., Disp: , Rfl:  .  Pirfenidone 267 MG CAPS, Take 2 capsules by mouth 3 (three) times daily. , Disp: , Rfl:  .  spironolactone (ALDACTONE) 25 MG tablet, Take 1 tablet (25 mg total) by mouth daily., Disp: 30 tablet, Rfl: 11 .  triamcinolone cream (KENALOG) 0.1 %, Apply 1 application topically 2 (two) times daily., Disp: , Rfl:  .  umeclidinium bromide (INCRUSE ELLIPTA) 62.5 MCG/INH AEPB, Inhale 1 puff into the lungs daily., Disp: 2 each, Rfl: 0 .  valsartan (DIOVAN) 160 MG tablet, Take 160 mg by mouth daily., Disp: , Rfl:  .  warfarin (COUMADIN) 5 MG tablet, TAKE AS DIRECTED, Disp: 120 tablet, Rfl: 1

## 2016-04-21 NOTE — Assessment & Plan Note (Addendum)
He has World Pharmacologist group 3 disease. With slow diuresis he seeing improvement. There is no indication for a pulmonary vasodilator.  Plan: Continue lasix Check pro-bnp today and next visit

## 2016-04-21 NOTE — Assessment & Plan Note (Signed)
He has done well with the addition of Incruise. This has helped his breathing. Further, he continues to diurese and an appropriate rate as his weight is down 4 pounds today a think both of these are contributing to improvement in his shortness of breath.  Plan: Continue Incruise and Flovent Advised to rinse his mouth after using inhaled medicines Flu shot is up-to-date

## 2016-04-21 NOTE — Patient Instructions (Signed)
Keep taking Incruise and Flovent as you're doing Follow-up with Dr. Chase Caller as previously planned We will call you with the results of today's blood work. Follow-up with me in 6 months.

## 2016-04-22 ENCOUNTER — Telehealth: Payer: Self-pay | Admitting: Pulmonary Disease

## 2016-04-22 LAB — PRO B NATRIURETIC PEPTIDE: NT-PRO BNP: 4540 pg/mL — AB (ref 0–376)

## 2016-04-22 MED ORDER — UMECLIDINIUM BROMIDE 62.5 MCG/INH IN AEPB: 1.0000 | INHALATION_SPRAY | Freq: Every day | RESPIRATORY_TRACT | 5 refills | Status: DC

## 2016-04-22 NOTE — Telephone Encounter (Signed)
Spoke with patient-states he needs Incruse Rx sent to pharmacy on file. Pt is aware that I have taken care of rx refill and nothing more needed at this time.

## 2016-05-05 ENCOUNTER — Ambulatory Visit (INDEPENDENT_AMBULATORY_CARE_PROVIDER_SITE_OTHER): Payer: Medicare Other

## 2016-05-05 DIAGNOSIS — I6350 Cerebral infarction due to unspecified occlusion or stenosis of unspecified cerebral artery: Secondary | ICD-10-CM | POA: Diagnosis not present

## 2016-05-05 DIAGNOSIS — Z7901 Long term (current) use of anticoagulants: Secondary | ICD-10-CM

## 2016-05-05 DIAGNOSIS — I4891 Unspecified atrial fibrillation: Secondary | ICD-10-CM

## 2016-05-05 LAB — POCT INR: INR: 2.6

## 2016-05-12 ENCOUNTER — Other Ambulatory Visit: Payer: Self-pay | Admitting: Nurse Practitioner

## 2016-05-16 ENCOUNTER — Telehealth: Payer: Self-pay | Admitting: Internal Medicine

## 2016-05-16 NOTE — Telephone Encounter (Signed)
Spoke with the pt  He states received a letter from his insurance about open enrollment  He states that the letter states changes may need to be made to his medications  He is unsure of name of med  I advised that he call and check with his insurance  He verbalized understanding

## 2016-05-20 ENCOUNTER — Encounter: Payer: Self-pay | Admitting: Internal Medicine

## 2016-05-23 NOTE — Telephone Encounter (Signed)
Pt called and left information:   Patrick Weiss [phone]/ [phone] Fax # [phone]) [address], [place], [place]..the patient contact # [phone].[name]

## 2016-06-06 ENCOUNTER — Ambulatory Visit (INDEPENDENT_AMBULATORY_CARE_PROVIDER_SITE_OTHER): Payer: Medicare Other | Admitting: *Deleted

## 2016-06-06 DIAGNOSIS — Z7901 Long term (current) use of anticoagulants: Secondary | ICD-10-CM

## 2016-06-06 DIAGNOSIS — I4891 Unspecified atrial fibrillation: Secondary | ICD-10-CM | POA: Diagnosis not present

## 2016-06-06 DIAGNOSIS — I6350 Cerebral infarction due to unspecified occlusion or stenosis of unspecified cerebral artery: Secondary | ICD-10-CM | POA: Diagnosis not present

## 2016-06-06 LAB — POCT INR: INR: 2.1

## 2016-06-07 ENCOUNTER — Encounter: Payer: Self-pay | Admitting: Interventional Cardiology

## 2016-06-14 ENCOUNTER — Ambulatory Visit (INDEPENDENT_AMBULATORY_CARE_PROVIDER_SITE_OTHER): Payer: Medicare Other | Admitting: Interventional Cardiology

## 2016-06-14 ENCOUNTER — Encounter: Payer: Self-pay | Admitting: Interventional Cardiology

## 2016-06-14 VITALS — BP 132/88 | HR 80 | Ht 70.0 in | Wt 200.4 lb

## 2016-06-14 DIAGNOSIS — I480 Paroxysmal atrial fibrillation: Secondary | ICD-10-CM

## 2016-06-14 DIAGNOSIS — I10 Essential (primary) hypertension: Secondary | ICD-10-CM | POA: Diagnosis not present

## 2016-06-14 DIAGNOSIS — I6350 Cerebral infarction due to unspecified occlusion or stenosis of unspecified cerebral artery: Secondary | ICD-10-CM

## 2016-06-14 DIAGNOSIS — I2781 Cor pulmonale (chronic): Secondary | ICD-10-CM

## 2016-06-14 DIAGNOSIS — Z7901 Long term (current) use of anticoagulants: Secondary | ICD-10-CM

## 2016-06-14 DIAGNOSIS — I2510 Atherosclerotic heart disease of native coronary artery without angina pectoris: Secondary | ICD-10-CM | POA: Diagnosis not present

## 2016-06-14 LAB — BASIC METABOLIC PANEL
BUN: 37 mg/dL — ABNORMAL HIGH (ref 7–25)
CALCIUM: 9.9 mg/dL (ref 8.6–10.3)
CHLORIDE: 106 mmol/L (ref 98–110)
CO2: 27 mmol/L (ref 20–31)
Creat: 1.75 mg/dL — ABNORMAL HIGH (ref 0.70–1.18)
GLUCOSE: 81 mg/dL (ref 65–99)
Potassium: 4.8 mmol/L (ref 3.5–5.3)
SODIUM: 143 mmol/L (ref 135–146)

## 2016-06-14 NOTE — Progress Notes (Signed)
Cardiology Office Note    Date:  06/14/2016   ID:  [name], DOB [birth year], MRN [id number]  PCP:  Foye Spurling, MD  Cardiologist: Sinclair Grooms, MD   Chief Complaint  Patient presents with  . Congestive Heart Failure    History of Present Illness:  Patrick Weiss is a 75 y.o. male  follow-up of atrial fibrillation (paroxysmal), hypertension, CVA, diastolic heart failure, pulmonary fibrosis with right heart failure and chronic hypoxia area  The patient is having increasing difficulty with ambulation. He feels tired. He has had significant weight gain. He has pulmonary fibrosis and is followed by Dr. Lake Bells. He denies chest pain. He is on chronic oxygen therapy. He has noticed significant increase in lower extremity swelling. When seen by Dr. Curt Jews, weight was 174 pounds. Today, the weight is 200 pounds.  N-terminal BNP performed by Dr. Lake Bells to was greater than 3000 in mid October when his weight was 174 pounds.  Past Medical History:  Diagnosis Date  . A-fib (HCC)    paroxysmal  . CHF (congestive heart failure) ([place])   . Hypertension   . Pulmonary fibrosis ([place])   . Shortness of breath dyspnea   . 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 BACK SURGERY  2001  . CARDIAC CATHETERIZATION N/A 04/28/2015   Procedure: Right/Left Heart Cath and Coronary Angiography;  Surgeon: Belva Crome, MD;  Location: [facility];  Service: Cardiovascular;  Laterality: N/A;  . CARDIAC CATHETERIZATION N/A 03/16/2016   Procedure: Right Heart Cath;  Surgeon: Belva Crome, MD;  Location: [place] [facility];  Service: Cardiovascular;  Laterality: N/A;  . TIBIA FRACTURE SURGERY  1975    Current Medications: Outpatient Medications Prior to Visit  Medication Sig Dispense Refill  . allopurinol (ZYLOPRIM) 100 MG tablet Take 100 mg by mouth daily.    Marland Kitchen  atorvastatin (LIPITOR) 10 MG tablet TAKE 1 TABLET(10 MG) BY MOUTH DAILY 90 tablet 1  . Calcium Carbonate-Vitamin D (CALCIUM 600+D) 600-200 MG-UNIT TABS Take 1 tablet by mouth 2 (two) times daily.    . Carboxymethylcellulose Sodium (REFRESH TEARS OP) Place 1 drop into both eyes daily as needed (dry eyes).     . fexofenadine (ALLEGRA) 180 MG tablet Take 180 mg by mouth daily as needed for allergies.     . fluticasone (FLOVENT HFA) 44 MCG/ACT inhaler Inhale 2 puffs into the lungs 2 (two) times daily. 10.6 g 5  . furosemide (LASIX) 40 MG tablet Take 1 tablet by mouth at bedtime.  3  . furosemide (LASIX) 80 MG tablet Take 80 mg by mouth daily.    . OXYGEN Inhale 3-4 L into the lungs continuous.    . Pirfenidone 267 MG CAPS Take 2 capsules by mouth 3 (three) times daily.     Marland Kitchen spironolactone (ALDACTONE) 25 MG tablet Take 1 tablet (25 mg total) by mouth daily. 30 tablet 11  . triamcinolone cream (KENALOG) 0.1 % Apply 1 application topically 2 (two) times daily.    Marland Kitchen umeclidinium bromide (INCRUSE ELLIPTA) 62.5 MCG/INH AEPB Inhale 1 puff into the lungs daily. 1 each 5  . valsartan (DIOVAN) 160 MG tablet Take 160 mg by mouth daily.    Marland Kitchen warfarin (COUMADIN) 5 MG tablet TAKE AS DIRECTED 120 tablet 1   No facility-administered medications prior to visit.  Allergies:   Patient has no known allergies.   Social History   Social History  . Marital status: Married    Spouse name: N/A  . Number of children: N/A  . Years of education: N/A   Occupational History  . retired    Social History Main Topics  . Smoking status: Former Smoker    Packs/day: 1.00    Years: 30.00    Types: Cigarettes    Quit date: 09/09/1991  . Smokeless tobacco: Never Used  . Alcohol use 0.0 oz/week     Comment: glass of wine with meals  . Drug use: No  . Sexual activity: Yes    Partners: Female   Other Topics Concern  . None   Social History Narrative  . None     Family History:  The patient's family history  includes Cancer in his father.   ROS:   Please see the history of present illness.    Increasing difficulty with breathing and ambulation. Appetite is been stable. He is compliant with medical therapy.  All other systems reviewed and are negative.   PHYSICAL EXAM:   VS:  BP 132/88   Pulse 80   Ht _0  (1.778 m)   Wt 200 lb 6.4 oz (90.9 kg)   SpO2 (!) 70% Comment: on continuos oxygen  BMI 28.75 kg/m    GEN: Well nourished, well developed, in no acute distress  HEENT: normal  Neck: no JVD, carotid bruits, or masses. Cardiac: RRR; no murmurs, rubs, or gallops,no edema . Respiratory:  clear to auscultation bilaterally, normal work of breathing GI: soft, nontender, nondistended, + BS MS: no deformity or atrophy  Skin: warm and dry, no rash Neuro:  Alert and Oriented x 3, Strength and sensation are intact Psych: euthymic mood, full affect  Wt Readings from Last 3 Encounters:  06/14/16 200 lb 6.4 oz (90.9 kg)  04/21/16 174 lb (78.9 kg)  03/22/16 178 lb 3.2 oz (80.8 kg)      Studies/Labs Reviewed:   EKG:  EKG  Not performed on this occasion.  Recent Labs: 09/07/2015: Magnesium 2.3 11/02/2015: B Natriuretic Peptide 805.0 02/24/2016: ALT 15 03/09/2016: BUN 32; Creatinine, Ser 1.65; Potassium 4.3; Sodium 142 03/11/2016: Hemoglobin 15.6; Platelets 138 04/21/2016: NT-Pro BNP 4,540   Lipid Panel    Component Value Date/Time   CHOL 114 (L) 06/15/2015 0828   TRIG 61 06/15/2015 0828   HDL 41 06/15/2015 0828   CHOLHDL 2.8 06/15/2015 0828   VLDL 12 06/15/2015 0828   LDLCALC 61 06/15/2015 0828    Additional studies/ records that were reviewed today include:  No new data    ASSESSMENT:    1. Coronary artery disease involving native coronary artery of native heart without angina pectoris   2. HYPERTENSION, BENIGN   3. Paroxysmal atrial fibrillation (HCC)   4. Cor pulmonale, chronic ([place])   5. Long term current use of anticoagulant therapy      PLAN:  In order of  problems listed above:  1. Not currently active. 2. Well controlled. 3. Presumed to be in sinus rhythm but need to exclude atrial flutter/fibrillation with EKG on return in one week this would be especially important if he has not has significant diuresis. 4. Marked fluid retention with anasarca. Increase furosemide 120 mg twice a day for 72 hours (total of 6 doses) and repeat basic metabolic panel in 3 days. If unsuccessful at diuresis he will need to be admitted to the hospital. Continuous O2 therapy as  he is doing. 5. No bleeding complications on anticoagulation.    Medication Adjustments/Labs and Tests Ordered: Current medicines are reviewed at length with the patient today.  Concerns regarding medicines are outlined above.  Medication changes, Labs and Tests ordered today are listed in the Patient Instructions below. Patient Instructions  Medication Instructions:  1) START taking Furosemide 1105m (1.5tabs of your 821m twice daily for 3 days.  Then, go back to your regular dose.  Labwork: BMET today  Your physician recommends that you return for lab work on Friday morning.    Testing/Procedures: None  Follow-Up: Your physician recommends that you schedule a follow-up appointment in: 1 week with a PA or NP on a day Dr. SmTamala Julians in the office.    Any Other Special Instructions Will Be Listed Below (If Applicable).     If you need a refill on your cardiac medications before your next appointment, please call your pharmacy.      Signed, HeSinclair GroomsMD  06/14/2016 10:05 AM    [email or web address]  [id number]: [phone]: [phone]

## 2016-06-14 NOTE — Patient Instructions (Signed)
Medication Instructions:  1) START taking Furosemide 173m (1.5tabs of your 85m twice daily for 3 days.  Then, go back to your regular dose.  Labwork: BMET today  Your physician recommends that you return for lab work on Friday morning.    Testing/Procedures: None  Follow-Up: Your physician recommends that you schedule a follow-up appointment in: 1 week with a PA or NP on a day Dr. SmTamala Julians in the office.    Any Other Special Instructions Will Be Listed Below (If Applicable).     If you need a refill on your cardiac medications before your next appointment, please call your pharmacy.

## 2016-06-15 NOTE — Telephone Encounter (Signed)
Pt calling to check on the status of forms that were to have been sent back in nov please advise.Patrick Weiss

## 2016-06-15 NOTE — Telephone Encounter (Signed)
Patrick Dan do you know anything about this?

## 2016-06-15 NOTE — Telephone Encounter (Signed)
I have not received anything from [employer]. Will keep an eye out for it.

## 2016-06-16 NOTE — Telephone Encounter (Signed)
Pt calling back again about this says no one still has not contacted him about this ..please advise he can be reached @ [phone], pt says that [employer] is needing a letter of necessity  The fax # is  [phone]  Opt 2 he spoke to [place].Hillery Hunter

## 2016-06-16 NOTE — Telephone Encounter (Signed)
MR  Please see pt. email below  My request for assistance for [facility] was dienied because household being too high. I requested assistance from [facility] , Patient Access Network (PAN). I am requesting a Letter of Medical Necessity to be sent to [facility]. I was advised this evening by a caseworker Minette Brine") that a request was sent to you today 05/20/2016.   [place]  [email or web address]

## 2016-06-17 ENCOUNTER — Other Ambulatory Visit: Payer: Medicare Other | Admitting: *Deleted

## 2016-06-17 DIAGNOSIS — I2510 Atherosclerotic heart disease of native coronary artery without angina pectoris: Secondary | ICD-10-CM

## 2016-06-17 DIAGNOSIS — I480 Paroxysmal atrial fibrillation: Secondary | ICD-10-CM

## 2016-06-17 LAB — BASIC METABOLIC PANEL
BUN: 44 mg/dL — ABNORMAL HIGH (ref 7–25)
CALCIUM: 9.6 mg/dL (ref 8.6–10.3)
CO2: 25 mmol/L (ref 20–31)
CREATININE: 2.05 mg/dL — AB (ref 0.70–1.18)
Chloride: 103 mmol/L (ref 98–110)
Glucose, Bld: 113 mg/dL — ABNORMAL HIGH (ref 65–99)
Potassium: 3.8 mmol/L (ref 3.5–5.3)
SODIUM: 143 mmol/L (ref 135–146)

## 2016-06-18 ENCOUNTER — Encounter: Payer: Self-pay | Admitting: Interventional Cardiology

## 2016-06-18 NOTE — Progress Notes (Signed)
Cardiology Office Note    Date:  06/22/2016   ID:  Patrick Weiss, DOB 19-May-1941, MRN [id number]  PCP:  Foye Spurling, MD  Cardiologist:  Dr. Tamala Julian   CC: CHF follow up  History of Present Illness:  Patrick Weiss is a 75 y.o. 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   2D ECHO in 08/2014 showed EF 40-45%, G1DD, mild-mod TR and PA pressure 61. He has continued to have significant dyspnea. L/RHC in 04/2015 showed chronic stable CAD with mod-severe 3V CAD (65% occl RCA, 70% occl RPLB, 50% occl oLCx, 66% occl dLCx, 45% occl LAD, 85% occl D1) as well as severe pulmonary HTN. Medical therapy was recommended. He is followed by Dr. Chase Caller in pulmonology who recommended Right heart cath, done in 03/2016 which showed severe pulm HTN with pulmonary systolic pressure of 70, normal pulm cap wedge pressure and CO 3.06L/m.  He was seen in the office by Dr Tamala Julian 06/14/16 for worsening dyspnea, LE edema and weight gain. BNP was >3K. Weight up from 174--> 200 lb. His lasix was increased from 60m in AM and 495min PM to 12027mID. However, his creat went up from 1.75--> 2.05 and his lasix was decreased back down to 26m24mD.  Today he presents to clinic for follow up. He is possibly breathing a little better. His swelling is a lot better but still having swelling in his knees. He thinks his dry weight is around 184 lbs. No chest pain. No dizziness or syncope. Feeling well otherwise. Chronic dyspnea and orthopnea. No PND.   Past Medical History:  Diagnosis Date  . A-fib (HCC)    paroxysmal  . CHF (congestive heart failure) (HCC)[place]. Hypertension   . Pulmonary fibrosis (HCC)[place]. Shortness of breath dyspnea   . 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 BACK SURGERY  2001  . CARDIAC CATHETERIZATION N/A 04/28/2015   Procedure: Right/Left Heart Cath and Coronary Angiography;  Surgeon: HenrBelva Crome;  [facility];  Service: Cardiovascular;  Laterality: N/A;  . CARDIAC CATHETERIZATION N/A 03/16/2016   Procedure: Right Heart Cath;  Surgeon: HenrBelva Crome;  [facility];  Service: Cardiovascular;  Laterality: N/A;  . TIBIA FRACTURE SURGERY  1975    Current Medications: Outpatient Medications Prior to Visit  Medication Sig Dispense Refill  . allopurinol (ZYLOPRIM) 100 MG tablet Take 100 mg by mouth daily.    . atMarland Kitchenrvastatin (LIPITOR) 10 MG tablet TAKE 1 TABLET(10 MG) BY MOUTH DAILY 90 tablet 1  . Calcium Carbonate-Vitamin D (CALCIUM 600+D) 600-200 MG-UNIT TABS Take 1 tablet by mouth 2 (two) times daily.    . Carboxymethylcellulose Sodium (REFRESH TEARS OP) Place 1 drop into both eyes daily as needed (dry eyes).     . fexofenadine (ALLEGRA) 180 MG tablet Take 180 mg by mouth daily as needed for allergies.     . fluticasone (FLOVENT HFA) 44 MCG/ACT inhaler Inhale 2 puffs into the lungs 2 (two) times daily. 10.6 g 5  . furosemide (LASIX) 40 MG tablet Take 1 tablet by mouth at bedtime.  3  .  furosemide (LASIX) 80 MG tablet Take 80 mg by mouth daily.    . OXYGEN Inhale 3-4 L into the lungs continuous.    . Pirfenidone 267 MG CAPS Take 2 capsules by mouth 3 (three) times daily.     Marland Kitchen spironolactone (ALDACTONE) 25 MG tablet Take 1 tablet (25 mg total) by mouth daily. 30 tablet 11  . triamcinolone cream (KENALOG) 0.1 % Apply 1 application topically 2 (two) times daily.    Marland Kitchen umeclidinium bromide (INCRUSE ELLIPTA) 62.5 MCG/INH AEPB Inhale 1 puff into the lungs daily. 1 each 5  . valsartan (DIOVAN) 160 MG tablet Take 160 mg by mouth daily.    Marland Kitchen warfarin (COUMADIN) 5 MG tablet TAKE AS DIRECTED 120 tablet 1   No facility-administered medications prior to visit.      Allergies:   Patient  has no known allergies.   Social History   Social History  . Marital status: Married    Spouse name: N/A  . Number of children: N/A  . Years of education: N/A   Occupational History  . retired    Social History Main Topics  . Smoking status: Former Smoker    Packs/day: 1.00    Years: 30.00    Types: Cigarettes    Quit date: 09/09/1991  . Smokeless tobacco: Never Used  . Alcohol use 0.0 oz/week     Comment: glass of wine with meals  . Drug use: No  . Sexual activity: Yes    Partners: Female   Other Topics Concern  . None   Social History Narrative  . None     Family History:  The patient's family history includes Cancer in his father.     ROS:   Please see the history of present illness.    ROS All other systems reviewed and are negative.   PHYSICAL EXAM:   VS:  BP 100/60   Pulse 97   Ht _0  (1.778 m)   Wt 190 lb 1.9 oz (86.2 kg)   BMI 27.28 kg/m    GEN: Well nourished, well developed, in no acute distress  HEENT: normal  Neck: no JVD, carotid bruits, or masses Cardiac: RRR; no murmurs, rubs, or gallops, 1+ edema to knees Respiratory:  Crackles at bases, normal work of breathing GI: soft, nontender, nondistended, + BS MS: no deformity or atrophy  Skin: warm and dry, no rash Neuro:  Alert and Oriented x 3, Strength and sensation are intact Psych: euthymic mood, full affect  Wt Readings from Last 3 Encounters:  06/22/16 190 lb 1.9 oz (86.2 kg)  06/14/16 200 lb 6.4 oz (90.9 kg)  04/21/16 174 lb (78.9 kg)      Studies/Labs Reviewed:   EKG:  EKG is ordered today.  The ekg ordered today demonstrates NSR HR 97  Recent Labs: 09/07/2015: Magnesium 2.3 11/02/2015: B Natriuretic Peptide 805.0 02/24/2016: ALT 15 03/11/2016: Hemoglobin 15.6; Platelets 138 04/21/2016: NT-Pro BNP 4,540 06/17/2016: BUN 44; Creat 2.05; Potassium 3.8; Sodium 143      Lipid Panel    Component Value Date/Time   CHOL 114 (L) 06/15/2015 0828   TRIG 61 06/15/2015 0828   HDL 41  06/15/2015 0828   CHOLHDL 2.8 06/15/2015 0828   VLDL 12 06/15/2015 0828   LDLCALC 61 06/15/2015 0828    Additional studies/ records that were reviewed today include:   03/16/16 Right Heart Cath  Conclusion     Hemodynamic findings consistent with severe pulmonary hypertension.    Severe pulmonary hypertension with  peak pulmonary systolic pressure of 70 mmHg. PA mean ranged between 44 and 47 mmHg due to respiratory variation.  Normal pulmonary capillary wedge pressure with mean of 5 mmHg.  Cardiac output 3.06 L/m    04/28/15 Procedures    Right/Left Heart Cath and Coronary Angiography    Conclusion    1. Mid RCA to Dist RCA lesion, 65% stenosed. 2. 1st RPLB lesion, 70% stenosed. 3. Ost Cx to Prox Cx lesion, 50% stenosed. 4. Mid Cx to Dist Cx lesion, 65% stenosed. 5. Prox LAD to Dist LAD lesion, 45% stenosed. 6. 1st Diag lesion, 85% stenosed.   Chronic stable coronary artery disease with lesions as noted above. There is moderate to moderately severe involvement in all 3 territories.  Chronic systolic heart failure with normal left ventricular end-diastolic pressure. EF 40%.  Severe pulmonary hypertension. Recommendations:  Medical therapy per cardiology and pulmonary consultants.   Myoview Impression from 08/2014 Exercise Capacity: There is decreased exercise capacity with extreme shortness of breath early in stress. There was a decrease in O2 saturation with walking as low as 81% BP Response: Normal blood pressure response. Clinical Symptoms: Severe shortness of breath with walking ECG Impression: No significant ST segment change suggestive of ischemia. Comparison with Prior Nuclear Study: No images to compare  Overall Impression: The study is abnormal. It is a low risk scan. There is no definite scar or ischemia. There is mild left ventricular dysfunction. The patient had limited exercise tolerance. There was significant decrease in O2 saturation and  marked dyspnea with exercise. LV Ejection Fraction: 41%. LV Wall Motion: There is mild global hypokinesis.   Echo Study Conclusions from 08/2014 - Left ventricle: The cavity size was normal. Wall thickness was increased in a pattern of mild LVH. Systolic function was mildly to moderately reduced. The estimated ejection fraction was in the range of 40% to 45%. Diffuse hypokinesis with no identifiable regional variations. Doppler parameters are consistent with abnormal left ventricular relaxation (grade 1 diastolic dysfunction). Indeterminate ventricular filling pressure by Doppler parameters. - Aortic valve: There was trivial regurgitation. - Tricuspid valve: There was mild-moderate regurgitation directed centrally. - Pulmonary arteries: Systolic pressure was moderately increased. PA peak pressure: 61 mm Hg (S).    ASSESSMENT & PLAN:   Patrick Weiss is a 75 y.o. 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  Cor Pulmonale/acute on chronic systolic CHF: volume status is much better but sill not back to baseline. Continue lasix 72m BID for now and will see him back next week.  -- Continue ARB and spiro. No BB 2/2 severe lung disease.  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 On pirfenidone and home 02  Pulm HTN: felt to be WHO group 2. Not a candidate for pulmonary vasodilators. Continue diuretics for CHF.   HTN: BP well controlled currently.   PAF: ECG today with NSR. Continue coumadin for CHADSVASC of at least 6 (CHF, HTN, age, vasc dz, CVA).  COPD: continue home 02  CAD: continue ASA, statin. No BB given severe lung disease  Medication Adjustments/Labs and Tests Ordered: Current medicines are reviewed at length with the patient today.  Concerns regarding medicines are outlined above.   Medication changes, Labs and Tests ordered today are listed in the Patient Instructions below. Patient Instructions  Medication Instructions:  Your physician  recommends that you continue on your current medications as directed. Please refer to the Current Medication list given to you today.   Labwork: TODAY:  BMET  Testing/Procedures: None ordered  Follow-Up: Your physician recommends that you schedule a follow-up appointment in: 06/30/16 ARRIVE AT 8:45 FOR A 9:00 APPT WITH KATIE Rubert Frediani, PA-C   Any Other Special Instructions Will Be Listed Below (If Applicable).     If you need a refill on your cardiac medications before your next appointment, please call your pharmacy.      Signed, Angelena Form, PA-C  06/22/2016 8:34 AM    [facility] [address], [place], [place]  [address] Phone: [phone]; Fax: [phone]

## 2016-06-20 ENCOUNTER — Telehealth: Payer: Self-pay | Admitting: Interventional Cardiology

## 2016-06-20 NOTE — Telephone Encounter (Signed)
New message  Pt is calling to report results of new medication  Lasix 44m, Dr. STamala Juliancalled to chg to 864m2xday  Please call back

## 2016-06-20 NOTE — Telephone Encounter (Signed)
Daneil Dan: can you take care of this please?  Thanks  Dr. Brand Males, M.D., [facility].C.P Pulmonary and Critical Care Medicine Staff Physician [place] [facility] Pager: [phone], If no answer or between  15:00h - 7:00h: call [phone]  06/20/2016 12:16 PM

## 2016-06-20 NOTE — Telephone Encounter (Signed)
I spoke with pt. He reports he received message from Dr. Tamala Julian with lasix instruction change. He initially misunderstood message and on 12/9 he took 2 of the 80 mg tablets (160 mg) twice daily.  He read my chart message and on 12/10 made change to 80 mg furosemide twice daily. He is aware to continue this dose until office visit on 12/13.   Pt reports swelling has improved some.  Weight currently 188 lbs.   He is asking if he should continue other medications as ordered and I told him all other medications remain the same.

## 2016-06-20 NOTE — Telephone Encounter (Signed)
Reviewed with Truitt Merle, NP and pt should continue Lasix 80 mg twice daily and have lab work checked at office visit on 12/13

## 2016-06-21 NOTE — Telephone Encounter (Signed)
MR - this looks as though it has already been taken care of by cardiology. Nothing further needed.

## 2016-06-22 ENCOUNTER — Encounter: Payer: Self-pay | Admitting: Physician Assistant

## 2016-06-22 ENCOUNTER — Ambulatory Visit (INDEPENDENT_AMBULATORY_CARE_PROVIDER_SITE_OTHER): Payer: Medicare Other | Admitting: Physician Assistant

## 2016-06-22 VITALS — BP 100/60 | HR 97 | Ht 70.0 in | Wt 190.1 lb

## 2016-06-22 DIAGNOSIS — I5023 Acute on chronic systolic (congestive) heart failure: Secondary | ICD-10-CM | POA: Diagnosis not present

## 2016-06-22 DIAGNOSIS — I2781 Cor pulmonale (chronic): Secondary | ICD-10-CM

## 2016-06-22 DIAGNOSIS — I2510 Atherosclerotic heart disease of native coronary artery without angina pectoris: Secondary | ICD-10-CM | POA: Diagnosis not present

## 2016-06-22 DIAGNOSIS — I10 Essential (primary) hypertension: Secondary | ICD-10-CM | POA: Diagnosis not present

## 2016-06-22 DIAGNOSIS — I6350 Cerebral infarction due to unspecified occlusion or stenosis of unspecified cerebral artery: Secondary | ICD-10-CM

## 2016-06-22 DIAGNOSIS — I272 Pulmonary hypertension, unspecified: Secondary | ICD-10-CM

## 2016-06-22 DIAGNOSIS — I480 Paroxysmal atrial fibrillation: Secondary | ICD-10-CM

## 2016-06-22 DIAGNOSIS — J849 Interstitial pulmonary disease, unspecified: Secondary | ICD-10-CM

## 2016-06-22 DIAGNOSIS — J449 Chronic obstructive pulmonary disease, unspecified: Secondary | ICD-10-CM

## 2016-06-22 LAB — BASIC METABOLIC PANEL
BUN: 52 mg/dL — ABNORMAL HIGH (ref 7–25)
CO2: 30 mmol/L (ref 20–31)
Calcium: 10.1 mg/dL (ref 8.6–10.3)
Chloride: 100 mmol/L (ref 98–110)
Creat: 2.21 mg/dL — ABNORMAL HIGH (ref 0.70–1.18)
GLUCOSE: 82 mg/dL (ref 65–99)
POTASSIUM: 4.4 mmol/L (ref 3.5–5.3)
SODIUM: 140 mmol/L (ref 135–146)

## 2016-06-22 NOTE — Patient Instructions (Addendum)
Medication Instructions:  Your physician recommends that you continue on your current medications as directed. Please refer to the Current Medication list given to you today.   Labwork: TODAY:  BMET  Testing/Procedures: None ordered  Follow-Up: Your physician recommends that you schedule a follow-up appointment in: 06/30/16 ARRIVE AT 8:45 FOR A 9:00 APPT WITH KATIE THOMPSON, PA-C   Any Other Special Instructions Will Be Listed Below (If Applicable).     If you need a refill on your cardiac medications before your next appointment, please call your pharmacy.

## 2016-06-23 ENCOUNTER — Telehealth: Payer: Self-pay | Admitting: *Deleted

## 2016-06-23 MED ORDER — FUROSEMIDE 80 MG PO TABS: 80.0000 mg | ORAL_TABLET | Freq: Two times a day (BID) | ORAL | 1 refills | Status: AC

## 2016-06-23 NOTE — Telephone Encounter (Signed)
-----  Message from Eileen Stanford, PA-C sent at 06/23/2016  8:03 AM EST ----- Creat continue to rise a little. Discussed results with Dr. Tamala Julian. We will continue him on 47m lasix BID and continue to follow him closely. Will recheck labs at office visit next week

## 2016-06-27 NOTE — Progress Notes (Signed)
Cardiology Office Note    Date:  06/30/2016   ID:  Patrick Weiss, DOB 06-12-1941, MRN [id number]  PCP:  Foye Spurling, MD  Cardiologist:  Dr. Tamala Julian   CC: CHF follow up  History of Present Illness:  Patrick Weiss is a 75 y.o. 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   2D ECHO in 08/2014 showed EF 40-45%, G1DD, mild-mod TR and PA pressure 61. He has continued to have significant dyspnea. L/RHC in 04/2015 showed chronic stable CAD with mod-severe 3V CAD (65% occl RCA, 70% occl RPLB, 50% occl oLCx, 66% occl dLCx, 45% occl LAD, 85% occl D1) as well as severe pulmonary HTN. Medical therapy was recommended. He is followed by Dr. Chase Caller in pulmonology who recommended right heart cath, done in 03/2016 which showed severe pulm HTN with pulmonary systolic pressure of 70, normal pulm cap wedge pressure and CO 3.06L/m.  He was seen in the office by Dr Tamala Julian 06/14/16 for worsening dyspnea, LE edema and weight gain. BNP was >3K. Weight up from 174--> 200 lb. His lasix was increased from 68m in AM and 427min PM to 12049mID. However, his creat went up from 1.75--> 2.05 and his lasix was decreased back down to 70m54mD.  I saw him in the office on 06/22/16 for follow up.  His swelling was a lot better but still having swelling in his knees. I continue his lasix at 70mg57m. Repeat labs showed creat 2.05--> 2.20. Discussed with Dr. SmithTamala Julianwe both felt like we needed to cotninue lasix and accept a high creat.   Today he presents to clinic for follow up. His LE edema continues to get better slowly but still not quite to baseline. Having issues getting his IPF meds (experimental drug that get gets financial assistance for) and asks me to contact Dr. RamasChase Callerried to explain how I cannot really help this move along. No CP. No orthopnea or PND.  No dizziness or syncope. No blood in stool or urine. Otherwise feeling pretty good and weight continuing to decrease.    Past Medical History:  Diagnosis Date  . A-fib (HCC)    paroxysmal  . CHF (congestive heart failure) (HCC) [place] Hypertension   . Pulmonary fibrosis (HCC) [place] Shortness of breath dyspnea   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 BACK SURGERY  2001  . CARDIAC CATHETERIZATION N/A 04/28/2015   Procedure: Right/Left Heart Cath and Coronary Angiography;  Surgeon: HenryBelva Weiss  [facility];  Service: Cardiovascular;  Laterality: N/A;  . CARDIAC CATHETERIZATION N/A 03/16/2016   Procedure: Right Heart Cath;  Surgeon: HenryBelva Weiss  [facility];  Service: Cardiovascular;  Laterality: N/A;  . TIBIA FRACTURE SURGERY  1975    Current Medications: Outpatient Medications Prior to Visit  Medication Sig Dispense Refill  . allopurinol (ZYLOPRIM) 100 MG tablet Take 100 mg by mouth daily.    . atoMarland Kitchenvastatin (LIPITOR) 10 MG tablet TAKE 1 TABLET(10 MG) BY MOUTH DAILY 90 tablet 1  . Calcium Carbonate-Vitamin D (CALCIUM 600+D) 600-200 MG-UNIT TABS Take 1 tablet by  mouth 2 (two) times daily.    . Carboxymethylcellulose Sodium (REFRESH TEARS OP) Place 1 drop into both eyes daily as needed (dry eyes).     . fexofenadine (ALLEGRA) 180 MG tablet Take 180 mg by mouth daily as needed for allergies.     . fluticasone (FLOVENT HFA) 44 MCG/ACT inhaler Inhale 2 puffs into the lungs 2 (two) times daily. 10.6 g 5  . furosemide (LASIX) 80 MG tablet Take 1 tablet (80 mg total) by mouth 2 (two) times daily. 180 tablet 1  . OXYGEN Inhale 3-4 L into the lungs continuous.    . Pirfenidone 267 MG CAPS Take 2 capsules by mouth 3 (three) times daily.     Marland Kitchen spironolactone (ALDACTONE) 25 MG tablet Take 1 tablet (25 mg total) by mouth daily. 30  tablet 11  . triamcinolone cream (KENALOG) 0.1 % Apply 1 application topically 2 (two) times daily.    Marland Kitchen umeclidinium bromide (INCRUSE ELLIPTA) 62.5 MCG/INH AEPB Inhale 1 puff into the lungs daily. 1 each 5  . valsartan (DIOVAN) 160 MG tablet Take 160 mg by mouth daily.    Marland Kitchen warfarin (COUMADIN) 5 MG tablet TAKE AS DIRECTED 120 tablet 1   No facility-administered medications prior to visit.      Allergies:   Patient has no known allergies.   Social History   Social History  . Marital status: Married    Spouse name: N/A  . Number of children: N/A  . Years of education: N/A   Occupational History  . retired    Social History Main Topics  . Smoking status: Former Smoker    Packs/day: 1.00    Years: 30.00    Types: Cigarettes    Quit date: 09/09/1991  . Smokeless tobacco: Never Used  . Alcohol use 0.0 oz/week     Comment: glass of wine with meals  . Drug use: No  . Sexual activity: Yes    Partners: Female   Other Topics Concern  . None   Social History Narrative  . None     Family History:  The patient's family history includes Cancer in his father.     ROS:   Please see the history of present illness.    ROS All other systems reviewed and are negative.   PHYSICAL EXAM:   VS:  BP 138/74   Pulse 66   Ht _0  (1.778 m)   Wt 188 lb 1.9 oz (85.3 kg)   SpO2 99%   BMI 26.99 kg/m    GEN: Well nourished, well developed, in no acute distress  HEENT: normal  Neck: no JVD, carotid bruits, or masses Cardiac: RRR; no murmurs, rubs, or gallops, 1+ LE edema  Respiratory:  Crackles at bases, normal work of breathing GI: soft, nontender, nondistended, + BS MS: no deformity or atrophy  Skin: warm and dry, no rash Neuro:  Alert and Oriented x 3, Strength and sensation are intact Psych: euthymic mood, full affect  Wt Readings from Last 3 Encounters:  06/30/16 188 lb 1.9 oz (85.3 kg)  06/22/16 190 lb 1.9 oz (86.2 kg)  06/14/16 200 lb 6.4 oz (90.9 kg)      Studies/Labs  Reviewed:   EKG:  EKG is NOT ordered today.    Recent Labs: 09/07/2015: Magnesium 2.3 11/02/2015: B Natriuretic Peptide 805.0 02/24/2016: ALT 15 03/11/2016: Hemoglobin 15.6; Platelets 138 04/21/2016: NT-Pro BNP 4,540 06/22/2016: BUN 52; Creat 2.21; Potassium 4.4; Sodium 140      Lipid Panel  Component Value Date/Time   CHOL 114 (L) 06/15/2015 0828   TRIG 61 06/15/2015 0828   HDL 41 06/15/2015 0828   CHOLHDL 2.8 06/15/2015 0828   VLDL 12 06/15/2015 0828   LDLCALC 61 06/15/2015 0828    Additional studies/ records that were reviewed today include:   03/16/16 Right Heart Cath  Conclusion     Hemodynamic findings consistent with severe pulmonary hypertension.    Severe pulmonary hypertension with peak pulmonary systolic pressure of 70 mmHg. PA mean ranged between 44 and 47 mmHg due to respiratory variation.  Normal pulmonary capillary wedge pressure with mean of 5 mmHg.  Cardiac output 3.06 L/m    04/28/15 Procedures    Right/Left Heart Cath and Coronary Angiography    Conclusion    1. Mid RCA to Dist RCA lesion, 65% stenosed. 2. 1st RPLB lesion, 70% stenosed. 3. Ost Cx to Prox Cx lesion, 50% stenosed. 4. Mid Cx to Dist Cx lesion, 65% stenosed. 5. Prox LAD to Dist LAD lesion, 45% stenosed. 6. 1st Diag lesion, 85% stenosed.   Chronic stable coronary artery disease with lesions as noted above. There is moderate to moderately severe involvement in all 3 territories.  Chronic systolic heart failure with normal left ventricular end-diastolic pressure. EF 40%.  Severe pulmonary hypertension. Recommendations:  Medical therapy per cardiology and pulmonary consultants.   Myoview Impression from 08/2014 Exercise Capacity: There is decreased exercise capacity with extreme shortness of breath early in stress. There was a decrease in O2 saturation with walking as low as 81% BP Response: Normal blood pressure response. Clinical Symptoms: Severe shortness of  breath with walking ECG Impression: No significant ST segment change suggestive of ischemia. Comparison with Prior Nuclear Study: No images to compare  Overall Impression: The study is abnormal. It is a low risk scan. There is no definite scar or ischemia. There is mild left ventricular dysfunction. The patient had limited exercise tolerance. There was significant decrease in O2 saturation and marked dyspnea with exercise. LV Ejection Fraction: 41%. LV Wall Motion: There is mild global hypokinesis.   Echo Study Conclusions from 08/2014 - Left ventricle: The cavity size was normal. Wall thickness was increased in a pattern of mild LVH. Systolic function was mildly to moderately reduced. The estimated ejection fraction was in the range of 40% to 45%. Diffuse hypokinesis with no identifiable regional variations. Doppler parameters are consistent with abnormal left ventricular relaxation (grade 1 diastolic dysfunction). Indeterminate ventricular filling pressure by Doppler parameters. - Aortic valve: There was trivial regurgitation. - Tricuspid valve: There was mild-moderate regurgitation directed centrally. - Pulmonary arteries: Systolic pressure was moderately increased. PA peak pressure: 61 mm Hg (S).    ASSESSMENT & PLAN:   Patrick Weiss is a 75 y.o. 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  Cor Pulmonale/acute on chronic systolic CHF: volume status better on lasix 27m BID but still not quite to baseline. Continue same dosing and weight continues to slowly decrease and LE edema improving.  -- Continue ARB and spiro. No BB 2/2 severe lung disease.  -- Check BMET today  CKD: creat up to 2.21 on 06/22/16. 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 On pirfenidone and home 02. Having trouble  getting this mediation. Asks me to message Dr. RChase Callerabout this.   Pulm HTN: felt  to be WHO group 2. Not a candidate for pulmonary vasodilators. Continue diuretics for CHF.   HTN: BP well controlled currently.   PAF: sounds regular on exam today. Continue coumadin for CHADSVASC of at least 6 (CHF, HTN, age, vasc dz, CVA).  COPD: continue home 02  CAD: continue ASA, statin. No BB given severe lung disease  Medication Adjustments/Labs and Tests Ordered: Current medicines are reviewed at length with the patient today.  Concerns regarding medicines are outlined above.  Medication changes, Labs and Tests ordered today are listed in the Patient Instructions below. Patient Instructions  Medication Instructions:  Your physician recommends that you continue on your current medications as directed. Please refer to the Current Medication list given to you today.   Labwork: TODAY:  BMET  Testing/Procedures: None ordered  Follow-Up: Your physician recommends that you schedule a follow-up appointment in: [place]   Any Other Special Instructions Will Be Listed Below (If Applicable).     If you need a refill on your cardiac medications before your next appointment, please call your pharmacy.      Signed, Angelena Form, PA-C  06/30/2016 9:20 AM    [facility] [address], [place], [place]  [address] Phone: [phone]; Fax: [phone]

## 2016-06-30 ENCOUNTER — Encounter: Payer: Self-pay | Admitting: Physician Assistant

## 2016-06-30 ENCOUNTER — Other Ambulatory Visit: Payer: Self-pay | Admitting: Physician Assistant

## 2016-06-30 ENCOUNTER — Ambulatory Visit (INDEPENDENT_AMBULATORY_CARE_PROVIDER_SITE_OTHER): Payer: Medicare Other | Admitting: Physician Assistant

## 2016-06-30 VITALS — BP 138/74 | HR 66 | Ht 70.0 in | Wt 188.1 lb

## 2016-06-30 DIAGNOSIS — I480 Paroxysmal atrial fibrillation: Secondary | ICD-10-CM | POA: Diagnosis not present

## 2016-06-30 DIAGNOSIS — J849 Interstitial pulmonary disease, unspecified: Secondary | ICD-10-CM

## 2016-06-30 DIAGNOSIS — E785 Hyperlipidemia, unspecified: Secondary | ICD-10-CM

## 2016-06-30 DIAGNOSIS — I5023 Acute on chronic systolic (congestive) heart failure: Secondary | ICD-10-CM

## 2016-06-30 DIAGNOSIS — I2781 Cor pulmonale (chronic): Secondary | ICD-10-CM

## 2016-06-30 DIAGNOSIS — I6350 Cerebral infarction due to unspecified occlusion or stenosis of unspecified cerebral artery: Secondary | ICD-10-CM

## 2016-06-30 DIAGNOSIS — I2510 Atherosclerotic heart disease of native coronary artery without angina pectoris: Secondary | ICD-10-CM

## 2016-06-30 DIAGNOSIS — I10 Essential (primary) hypertension: Secondary | ICD-10-CM

## 2016-06-30 DIAGNOSIS — I272 Pulmonary hypertension, unspecified: Secondary | ICD-10-CM

## 2016-06-30 DIAGNOSIS — Z7901 Long term (current) use of anticoagulants: Secondary | ICD-10-CM

## 2016-06-30 DIAGNOSIS — Z5181 Encounter for therapeutic drug level monitoring: Secondary | ICD-10-CM

## 2016-06-30 LAB — BASIC METABOLIC PANEL
BUN: 45 mg/dL — AB (ref 7–25)
CALCIUM: 9.9 mg/dL (ref 8.6–10.3)
CO2: 29 mmol/L (ref 20–31)
CREATININE: 2.16 mg/dL — AB (ref 0.70–1.18)
Chloride: 103 mmol/L (ref 98–110)
Glucose, Bld: 105 mg/dL — ABNORMAL HIGH (ref 65–99)
Potassium: 4.5 mmol/L (ref 3.5–5.3)
Sodium: 141 mmol/L (ref 135–146)

## 2016-06-30 NOTE — Addendum Note (Signed)
Addended by: Eulis Foster on: 06/30/2016 09:29 AM   Modules accepted: Orders

## 2016-06-30 NOTE — Patient Instructions (Signed)
Medication Instructions:  Your physician recommends that you continue on your current medications as directed. Please refer to the Current Medication list given to you today.   Labwork: TODAY:  BMET  Testing/Procedures: None ordered  Follow-Up: Your physician recommends that you schedule a follow-up appointment in: [place]   Any Other Special Instructions Will Be Listed Below (If Applicable).     If you need a refill on your cardiac medications before your next appointment, please call your pharmacy.

## 2016-07-06 ENCOUNTER — Ambulatory Visit (INDEPENDENT_AMBULATORY_CARE_PROVIDER_SITE_OTHER): Payer: Medicare Other | Admitting: *Deleted

## 2016-07-06 DIAGNOSIS — Z7901 Long term (current) use of anticoagulants: Secondary | ICD-10-CM

## 2016-07-06 DIAGNOSIS — I4891 Unspecified atrial fibrillation: Secondary | ICD-10-CM

## 2016-07-06 DIAGNOSIS — I6350 Cerebral infarction due to unspecified occlusion or stenosis of unspecified cerebral artery: Secondary | ICD-10-CM

## 2016-07-06 LAB — POCT INR: INR: 2.9

## 2016-07-12 ENCOUNTER — Encounter (HOSPITAL_COMMUNITY): Payer: Self-pay

## 2016-07-12 ENCOUNTER — Emergency Department (HOSPITAL_COMMUNITY)
Admission: EM | Admit: 2016-07-12 | Discharge: 2016-07-12 | Disposition: A | Discharge status: Home / Self Care | Payer: Medicare Other | Attending: Emergency Medicine | Admitting: Emergency Medicine

## 2016-07-12 ENCOUNTER — Telehealth: Payer: Self-pay | Admitting: Interventional Cardiology

## 2016-07-12 ENCOUNTER — Emergency Department (HOSPITAL_COMMUNITY): Payer: Medicare Other

## 2016-07-12 DIAGNOSIS — S0990XA Unspecified injury of head, initial encounter: Secondary | ICD-10-CM | POA: Diagnosis present

## 2016-07-12 DIAGNOSIS — H1132 Conjunctival hemorrhage, left eye: Secondary | ICD-10-CM | POA: Diagnosis not present

## 2016-07-12 DIAGNOSIS — Y939 Activity, unspecified: Secondary | ICD-10-CM | POA: Diagnosis not present

## 2016-07-12 DIAGNOSIS — Z8673 Personal history of transient ischemic attack (TIA), and cerebral infarction without residual deficits: Secondary | ICD-10-CM | POA: Diagnosis not present

## 2016-07-12 DIAGNOSIS — I5032 Chronic diastolic (congestive) heart failure: Secondary | ICD-10-CM | POA: Diagnosis not present

## 2016-07-12 DIAGNOSIS — Y929 Unspecified place or not applicable: Secondary | ICD-10-CM | POA: Diagnosis not present

## 2016-07-12 DIAGNOSIS — Y999 Unspecified external cause status: Secondary | ICD-10-CM | POA: Insufficient documentation

## 2016-07-12 DIAGNOSIS — Z87891 Personal history of nicotine dependence: Secondary | ICD-10-CM | POA: Diagnosis not present

## 2016-07-12 DIAGNOSIS — Z79899 Other long term (current) drug therapy: Secondary | ICD-10-CM | POA: Insufficient documentation

## 2016-07-12 DIAGNOSIS — W228XXA Striking against or struck by other objects, initial encounter: Secondary | ICD-10-CM | POA: Insufficient documentation

## 2016-07-12 DIAGNOSIS — H5440 Blindness, one eye, unspecified eye: Secondary | ICD-10-CM

## 2016-07-12 DIAGNOSIS — I2510 Atherosclerotic heart disease of native coronary artery without angina pectoris: Secondary | ICD-10-CM | POA: Insufficient documentation

## 2016-07-12 DIAGNOSIS — J449 Chronic obstructive pulmonary disease, unspecified: Secondary | ICD-10-CM | POA: Insufficient documentation

## 2016-07-12 DIAGNOSIS — H548 Legal blindness, as defined in USA: Secondary | ICD-10-CM | POA: Diagnosis not present

## 2016-07-12 DIAGNOSIS — I110 Hypertensive heart disease with heart failure: Secondary | ICD-10-CM | POA: Diagnosis not present

## 2016-07-12 DIAGNOSIS — Z7901 Long term (current) use of anticoagulants: Secondary | ICD-10-CM | POA: Insufficient documentation

## 2016-07-12 DIAGNOSIS — W19XXXA Unspecified fall, initial encounter: Secondary | ICD-10-CM

## 2016-07-12 LAB — BASIC METABOLIC PANEL
Anion gap: 7 (ref 5–15)
BUN: 43 mg/dL — ABNORMAL HIGH (ref 6–20)
CALCIUM: 9.6 mg/dL (ref 8.9–10.3)
CO2: 28 mmol/L (ref 22–32)
CREATININE: 1.91 mg/dL — AB (ref 0.61–1.24)
Chloride: 107 mmol/L (ref 101–111)
GFR calc non Af Amer: 33 mL/min — ABNORMAL LOW (ref >60)
GFR, EST AFRICAN AMERICAN: 38 mL/min — AB (ref >60)
Glucose, Bld: 82 mg/dL (ref 65–99)
Potassium: 4.4 mmol/L (ref 3.5–5.1)
SODIUM: 142 mmol/L (ref 135–145)

## 2016-07-12 LAB — CBC WITH DIFFERENTIAL/PLATELET
BASOS ABS: 0 10*3/uL (ref 0.0–0.1)
Basophils Relative: 0 %
EOS PCT: 3 %
Eosinophils Absolute: 0.1 10*3/uL (ref 0.0–0.7)
HEMATOCRIT: 45.1 % (ref 39.0–52.0)
HEMOGLOBIN: 14.9 g/dL (ref 13.0–17.0)
LYMPHS ABS: 1 10*3/uL (ref 0.7–4.0)
LYMPHS PCT: 21 %
MCH: 30.2 pg (ref 26.0–34.0)
MCHC: 33 g/dL (ref 30.0–36.0)
MCV: 91.3 fL (ref 78.0–100.0)
MONOS PCT: 8 %
Monocytes Absolute: 0.4 10*3/uL (ref 0.1–1.0)
Neutro Abs: 3.4 10*3/uL (ref 1.7–7.7)
Neutrophils Relative %: 68 %
Platelets: 123 10*3/uL — ABNORMAL LOW (ref 150–400)
RBC: 4.94 MIL/uL (ref 4.22–5.81)
RDW: 18.4 % — ABNORMAL HIGH (ref 11.5–15.5)
WBC: 4.9 10*3/uL (ref 4.0–10.5)

## 2016-07-12 LAB — PROTIME-INR
INR: 2.73
PROTHROMBIN TIME: 29.5 s — AB (ref 11.4–15.2)

## 2016-07-12 NOTE — ED Triage Notes (Signed)
Pt presents for evaluation of dizziness and fall last night. Pt reports he was sitting on side of tub leaning over when he became dizzy and fell forward, hitting L temple area. Pt reports taking coumadin. Pt reports L eye has become increasingly blood shot with visual changes. Pt reports hx of implants to eyes.

## 2016-07-12 NOTE — ED Provider Notes (Signed)
[facility] DEPT Provider Note   CSN: [id number] Arrival date & time: 07/12/16  1118     History   Chief Complaint Chief Complaint  Patient presents with  . Fall    HPI Patrick Weiss is a 76 y.o. male.  Pt presents to the ED today due to fall and dizziness.  The pt said he was sitting on the tub last night, felt dizzy, and fell over.  He said that he did hit the left temple area.  Since the fall, he has been unable to see out of his left eye.  The pt said that he does have corneal implants in both eyes.  The pt is on coumadin due to a.fib and stroke (chadvasc 6).  Pt is followed by Dr. Kathlen Mody (ophthalmology) at [facility].      Past Medical History:  Diagnosis Date  . A-fib (HCC)    paroxysmal  . CHF (congestive heart failure) ([place])   . Hypertension   . Pulmonary fibrosis ([place])   . Shortness of breath dyspnea   . 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 Encounter for therapeutic drug monitoring 02/24/2016  . Cor pulmonale, chronic ([place]) 02/24/2016  . Other fatigue 09/07/2015  . Pedal edema 09/07/2015  . Chronic diastolic heart failure, NYHA class 3 ([place]) 06/24/2015  . Coronary artery disease involving native coronary artery of native heart without angina pectoris 04/28/2015  . Emphysema of lung ([place]) 03/21/2015  . IPF (idiopathic pulmonary fibrosis) ([place]) 03/21/2015  . Chronic hypoxemic respiratory failure ([place]) 02/27/2015  . CVA (cerebral infarction) 01/26/2015  . COPD (chronic obstructive pulmonary disease) ([place]) 11/18/2014  . Smoking history 10/20/2014  . Dysphagia 10/20/2014  . Long term current use of anticoagulant therapy 10/20/2010  . HYPERLIPIDEMIA-MIXED 02/02/2010  . HYPERTENSION, BENIGN 02/02/2010  . ATRIAL FIBRILLATION 02/02/2010     Past Surgical History:  Procedure Laterality Date  . BACK SURGERY  2001  . CARDIAC CATHETERIZATION N/A 04/28/2015   Procedure: Right/Left Heart Cath and Coronary Angiography;  Surgeon: Belva Crome, MD;  Location: [facility];  Service: Cardiovascular;  Laterality: N/A;  . CARDIAC CATHETERIZATION N/A 03/16/2016   Procedure: Right Heart Cath;  Surgeon: Belva Crome, MD;  Location: [facility];  Service: Cardiovascular;  Laterality: N/A;  . [place] Medications    Prior to Admission medications   Medication Sig Start Date End Date Taking? Authorizing Provider  allopurinol (ZYLOPRIM) 100 MG tablet Take 100 mg by mouth daily.   Yes Historical Provider, MD  atorvastatin (LIPITOR) 10 MG tablet TAKE 1 TABLET(10 MG) BY MOUTH DAILY 05/12/16  Yes Burtis Junes, NP  Calcium Carbonate-Vitamin D (CALCIUM 600+D) 600-200 MG-UNIT TABS Take 1 tablet by mouth 2 (two) times daily.   Yes Historical Provider, MD  Carboxymethylcellulose Sodium (REFRESH TEARS OP) Place 1 drop into both eyes daily as needed (dry eyes).    Yes Historical Provider, MD  fexofenadine (ALLEGRA) 180 MG tablet Take 180 mg by mouth daily as needed for allergies.    Yes Historical Provider, MD  fluticasone (FLOVENT HFA) 44 MCG/ACT inhaler Inhale 2 puffs into the lungs 2 (two) times daily. 03/25/16  Yes Brand Males, MD  furosemide (LASIX)  80 MG tablet Take 1 tablet (80 mg total) by mouth 2 (two) times daily. 06/23/16  Yes Eileen Stanford, PA-C  OXYGEN Inhale 3-4 L into the lungs continuous.   Yes Historical Provider, MD  Pirfenidone 267 MG CAPS Take 2 capsules by mouth 3 (three) times daily.    Yes Historical Provider, MD  spironolactone (ALDACTONE) 25 MG tablet Take 1 tablet (25 mg total) by mouth daily. 12/10/15  Yes Belva Crome, MD  umeclidinium bromide (INCRUSE ELLIPTA) 62.5 MCG/INH AEPB Inhale 1 puff into the lungs daily. 04/22/16  Yes Juanito Doom, MD  valsartan  (DIOVAN) 160 MG tablet Take 160 mg by mouth daily.   Yes Historical Provider, MD  warfarin (COUMADIN) 5 MG tablet TAKE AS DIRECTED Patient taking differently: pt takes 7.33m on Sunday and Wednesday, takes 523mall other days 12/21/15  Yes JaThompson GrayerMD    Family History Family History  Problem Relation Age of Onset  . Cancer Father     Social History Social History  Substance Use Topics  . Smoking status: Former Smoker    Packs/day: 1.00    Years: 30.00    Types: Cigarettes    Quit date: 09/09/1991  . Smokeless tobacco: Never Used  . Alcohol use 0.0 oz/week     Comment: glass of wine with meals     Allergies   Patient has no known allergies.   Review of Systems Review of Systems  Eyes:       Left eye blindness  All other systems reviewed and are negative.    Physical Exam Updated Vital Signs BP 121/82 (BP Location: Right Arm)   Pulse 87   Temp 97.5 F (36.4 C) (Oral)   Resp 17   Ht _0  (1.778 m)   Wt 189 lb (85.7 kg)   SpO2 90%   BMI 27.12 kg/m   Physical Exam  Constitutional: He is oriented to person, place, and time. He appears well-developed and well-nourished.  HENT:  Head: Normocephalic and atraumatic.  Right Ear: External ear normal.  Left Ear: External ear normal.  Nose: Nose normal.  Mouth/Throat: Oropharynx is clear and moist.  Eyes: EOM and lids are normal. Lids are everted and swept, no foreign bodies found. Left conjunctiva has a hemorrhage.  Neck: Normal range of motion. Neck supple.  Cardiovascular: Normal rate, regular rhythm, normal heart sounds and intact distal pulses.   Pulmonary/Chest: Effort normal and breath sounds normal.  Abdominal: Soft. Bowel sounds are normal.  Musculoskeletal: Normal range of motion.  Neurological: He is alert and oriented to person, place, and time.  Skin: Skin is warm.  Psychiatric: He has a normal mood and affect. His behavior is normal. Judgment and thought content normal.  Nursing note and vitals  reviewed.    ED Treatments / Results  Labs (all labs ordered are listed, but only abnormal results are displayed) Labs Reviewed  CBC WITH DIFFERENTIAL/PLATELET - Abnormal; Notable for the following:       Result Value   RDW 18.4 (*)    Platelets 123 (*)    All other components within normal limits  PROTIME-INR - Abnormal; Notable for the following:    Prothrombin Time 29.5 (*)    All other components within normal limits  BASIC METABOLIC PANEL - Abnormal; Notable for the following:    BUN 43 (*)    Creatinine, Ser 1.91 (*)    GFR calc non Af Amer 33 (*)    GFR calc Af AmWyvonnia Lora  38 (*)    All other components within normal limits    EKG  EKG Interpretation None       Radiology Ct Head Wo Contrast  Result Date: 07/12/2016 CLINICAL DATA:  Pain following fall.  Blurred vision EXAM: CT HEAD WITHOUT CONTRAST TECHNIQUE: Contiguous axial images were obtained from the base of the skull through the vertex without intravenous contrast. COMPARISON:  Head CT September 23, 2005 and brain MRI September 23, 2005 FINDINGS: Brain: There is mild diffuse atrophy. There is no intracranial mass, hemorrhage, extra-axial fluid collection, or midline shift. There is evidence of an old infarct in the medial right frontal lobe, stable. There is patchy small vessel disease throughout the centra semiovale bilaterally. There is evidence of a prior small lacunar infarct in the lateral right cerebellum. No acute appearing infarct is evident on this study. Vascular: There is no hyperdense vessel. There is calcification in each carotid siphon region. There is calcification in the distal left vertebral artery. Skull: The bony calvarium or appears intact. There are old left and right nasal bone fractures, healed. Sinuses/Orbits: There is opacification in several ethmoid air cells bilaterally. There is extensive mucosal thickening in the left maxillary antrum with an air-fluid level. There is minimal mucosal thickening in the  posterior right maxillary antrum. There is mucosal thickening in the lateral right sphenoid sinus. There is mild mucosal thickening in the medial inferior left frontal sinus region. Orbits appear symmetric bilaterally. Other: Mastoids bilaterally are clear. IMPRESSION: Atrophy with extensive small vessel disease. Prior infarct medial right frontal lobe, stable. Small lacunar type infarct lateral right cerebellum, stable. No intracranial mass, hemorrhage, or extra-axial fluid collection. No acute appearing infarct. There are multiple foci of arterial vascular calcification. There is multifocal paranasal sinus disease. Electronically Signed   By: Lowella Grip III M.D.   On: 07/12/2016 13:51    Procedures Procedures (including critical care time)  Medications Ordered in ED Medications - No data to display   Initial Impression / Assessment and Plan / ED Course  I have reviewed the triage vital signs and the nursing notes.  Pertinent labs & imaging results that were available during my care of the patient were reviewed by me and considered in my medical decision making (see chart for details).  Clinical Course     Pt d/w Dr. Kathlen Mody who wants pt over at his office now.  Pt will be d/c via cab to take him there.  Final Clinical Impressions(s) / ED Diagnoses   Final diagnoses:  Blindness of left eye  Fall, initial encounter  Subconjunctival hemorrhage of left eye    New Prescriptions New Prescriptions   No medications on file     Isla Pence, MD 07/12/16 1500

## 2016-07-12 NOTE — ED Notes (Signed)
Dr. Gilford Raid reports pt needs to be transported to Dr. Isidore Moos office urgently before 3:30. Taxi called for patient. Pt A&OX4 and discharged in wheelchair and sent off in taxi.

## 2016-07-12 NOTE — Telephone Encounter (Signed)
Spoke with pt and he states that he fell yesterday getting our of the bath tub.  Pt states he bent over to dry his lower legs, became dizzy and fell, hit his head on the floor.  States he is unable to see clearly out of his left eye, it is swollen and very red.  Pt then asked that I speak with his wife.  She stated that pt's eye was very swollen and looked like there was a lot of "fluid" behind it.  Denies HA and dizziness at this time.  Advised wife that pt needs to report to ED for evaluation as pt is on Coumadin and concerned about bleed.  Wife and pt verbalized understanding and was in agreement with this plan.  Spoke with Gabriel Cirri, [facility] charge nurse and advised her of pt coming and situation.  Will forward to Dr. Tamala Julian to make him aware.

## 2016-07-12 NOTE — Telephone Encounter (Signed)
Patrick Weiss is calling because he fell yesterday and currently cannot see out of his left eye, states that it is blurry. Patient has not contacted PCP. Patrick Weiss stated that he is currently taking Lasix, 80 mg and stated that he was advised to call if he was dizzy or if he fell. Please call [phone]. Thanks.

## 2016-07-19 ENCOUNTER — Other Ambulatory Visit: Payer: Self-pay | Admitting: Internal Medicine

## 2016-07-19 DIAGNOSIS — H547 Unspecified visual loss: Secondary | ICD-10-CM

## 2016-07-20 ENCOUNTER — Telehealth: Payer: Self-pay

## 2016-07-20 ENCOUNTER — Ambulatory Visit (HOSPITAL_COMMUNITY)
Admission: RE | Admit: 2016-07-20 | Discharge: 2016-07-20 | Disposition: A | Discharge status: Home / Self Care | Payer: Medicare Other | Source: Ambulatory Visit | Attending: Internal Medicine | Admitting: Internal Medicine

## 2016-07-20 DIAGNOSIS — I6521 Occlusion and stenosis of right carotid artery: Secondary | ICD-10-CM | POA: Insufficient documentation

## 2016-07-20 DIAGNOSIS — H547 Unspecified visual loss: Secondary | ICD-10-CM | POA: Diagnosis not present

## 2016-07-20 LAB — VAS US CAROTID
LCCADDIAS: 22 cm/s
LCCADSYS: 58 cm/s
LCCAPDIAS: 25 cm/s
LCCAPSYS: 76 cm/s
LEFT ECA DIAS: -19 cm/s
LEFT VERTEBRAL DIAS: -14 cm/s
LICADSYS: -82 cm/s
LICAPSYS: -40 cm/s
Left ICA dist dias: -32 cm/s
Left ICA prox dias: -18 cm/s
RCCADSYS: 0 cm/s
RIGHT ECA DIAS: -13 cm/s
Right CCA prox dias: 7 cm/s
Right CCA prox sys: 36 cm/s

## 2016-07-20 NOTE — Telephone Encounter (Signed)
I attempted to call the number, but unfortunately, office was closed at the time. I reviewed the chart: Patient did not have a recent brain MRI but had one 10 years ago which showed an acute right frontal stroke. He has a history of right internal carotid artery occlusion which is not new, was seen 10 years ago. He was recently seen at the emergency room after a fall. He was apparently seen by his ophthalmologist for recent onset of blindness. I cannot see the ophthalmology records. I will try to call back and recommend that patient can be referred to our vascular doctors or stroke doctors, preferably Dr. Leonie Man, as he was previously seen by Dr. Leonie Man for stroke.  Beverlee Nims: would you call the office of Dr. Carlis Abbott with my recommendations to his nurse first thing in AM too?

## 2016-07-20 NOTE — Progress Notes (Signed)
Preliminary results by tech - Carotid Duplex Completed. Right ICA is occluded. The left ICA demonstrated mild plaque without significant stenosis. The right vertebral artery appeared occluded. The left vertebral artery is patent with antegrade flow. Results given to Iona Beard, at Dr. Anell Barr' office with my return number. Oda Cogan, BS, RDMS, RVT

## 2016-07-20 NOTE — Telephone Encounter (Signed)
Received a call from Dr. Jeanann Lewandowsky on this pt with sudden onset unilateral blindness. Dr. Carlis Abbott reports that pt has presented to ER, then opthalmology, and then back to Dr. Carlis Abbott for sudden eye blindness. Carotid doppler was performed. Dr. Carlis Abbott asking for advice on how to proceed by one our neurologists. Number to call back is [phone].

## 2016-07-21 ENCOUNTER — Other Ambulatory Visit: Payer: Self-pay | Admitting: Ophthalmology

## 2016-07-21 DIAGNOSIS — H34232 Retinal artery branch occlusion, left eye: Secondary | ICD-10-CM

## 2016-07-21 DIAGNOSIS — H53132 Sudden visual loss, left eye: Secondary | ICD-10-CM

## 2016-07-21 NOTE — Telephone Encounter (Signed)
I tried calling, I think office is still closed? I only got a recording that said not to leave a message.

## 2016-07-21 NOTE — Telephone Encounter (Signed)
I spoke to Dr. Ainsley Spinner office, he will have his referral team send [place] a referral to see Dr. Leonie Man.

## 2016-07-27 ENCOUNTER — Ambulatory Visit: Payer: Self-pay | Admitting: Neurology

## 2016-07-29 ENCOUNTER — Telehealth: Payer: Self-pay | Admitting: Internal Medicine

## 2016-07-29 ENCOUNTER — Telehealth: Payer: Self-pay | Admitting: Neurology

## 2016-07-29 NOTE — Telephone Encounter (Signed)
Patrick Weiss  Please look into patient concern  Also, he needs followup  Thanks  Dr. Brand Males, M.D., [facility].C.P Pulmonary and Critical Care Medicine Staff Physician [facility] Pager: [phone], If no answer or between  15:00h - 7:00h: call [phone]  07/29/2016 8:31 PM

## 2016-07-29 NOTE — Telephone Encounter (Signed)
Patient is scheduled to be seen on 08/25/16 appointment was rescheduled due to weather.  Patient requesting if he is able to get a sooner appointment please.

## 2016-07-30 ENCOUNTER — Other Ambulatory Visit: Payer: Self-pay | Admitting: Internal Medicine

## 2016-08-01 ENCOUNTER — Telehealth: Payer: Self-pay | Admitting: Internal Medicine

## 2016-08-01 NOTE — Telephone Encounter (Signed)
Spoke with pt, who states he has esbriet [employer] is needing a letter from [facility]. I have spoke with Remo Lipps with [employer], who states a [employer] form is needed. Remo Lipps states he will fax over this form, I have verified fax number with Remo Lipps. I have made pt aware of this.  Will route to [place] to f/u on.

## 2016-08-01 NOTE — Telephone Encounter (Signed)
Pt was last seen by Dr.Sethi in 2007. He is consider a new patient at [facility]. Dr. Leonie Man schedule is full. PT can always call back to r/s if anyone cancel. His appti is the earliest we have for Dr. Leonie Man schedule.

## 2016-08-02 NOTE — Telephone Encounter (Signed)
Pt returning call to elise wanting  Her to know that he has began the process and is waiting on forms.me

## 2016-08-02 NOTE — Telephone Encounter (Signed)
Patrick Weiss per [place] the pt called back and has started the process with [employer].  Pt is scheduled to see [facility] on 08/25/16

## 2016-08-02 NOTE — Telephone Encounter (Signed)
Please see phone note from 1.22.18. Will sign off.

## 2016-08-02 NOTE — Telephone Encounter (Signed)
Called [facility] and spoke to Numidia and states nothing is needed and the pt just needs to call [place] and re-enroll. Pt states he will call to re-enroll and will also call [employer] to see if they say something is needed. When pt calls back will need to schedule OV with [facility].    ------------------------------------------------------ Patrick Weiss  Please look into patient concern  Also, he needs followup  Thanks  Dr. Brand Males, M.D., [facility].C.P Pulmonary and Critical Care Medicine Staff Physician [place] [facility] Pager: [phone], If no answer or between  15:00h - 7:00h: call [phone]  07/29/2016 8:31 PM

## 2016-08-03 ENCOUNTER — Ambulatory Visit (INDEPENDENT_AMBULATORY_CARE_PROVIDER_SITE_OTHER): Payer: Medicare Other | Admitting: *Deleted

## 2016-08-03 DIAGNOSIS — I6350 Cerebral infarction due to unspecified occlusion or stenosis of unspecified cerebral artery: Secondary | ICD-10-CM

## 2016-08-03 DIAGNOSIS — Z7901 Long term (current) use of anticoagulants: Secondary | ICD-10-CM

## 2016-08-03 DIAGNOSIS — I4891 Unspecified atrial fibrillation: Secondary | ICD-10-CM

## 2016-08-03 LAB — POCT INR: INR: 7.4

## 2016-08-03 LAB — PROTIME-INR
INR: 6.6 (ref 0.8–1.2)
Prothrombin Time: 65.5 s — ABNORMAL HIGH (ref 9.1–12.0)

## 2016-08-04 NOTE — Telephone Encounter (Signed)
They are sending him a form - was to be mailed to him yesterday and returned via fax.  Company has started the process for his Rx, but he had them hold off on this until he knows if he was approved for the patient assistance.  Pt mentioned something about if the assistance would affect his taxes - advised patient that we are unsure on this and would speak with Daneil Dan and if she has any information to offer I will call him back. Pt also mentioned a letter from [employer] discussed medical necessity but stated that he was informed a letter is not needed by [employer] and stated he will continue to wait for the forms from [employer] and "wait and see."  Discussed verbally with Daneil Dan Will sign off

## 2016-08-08 ENCOUNTER — Other Ambulatory Visit: Payer: Self-pay

## 2016-08-08 ENCOUNTER — Ambulatory Visit (INDEPENDENT_AMBULATORY_CARE_PROVIDER_SITE_OTHER): Payer: Medicare Other

## 2016-08-08 ENCOUNTER — Ambulatory Visit (HOSPITAL_COMMUNITY): Payer: Medicare Other | Attending: Cardiology

## 2016-08-08 DIAGNOSIS — I110 Hypertensive heart disease with heart failure: Secondary | ICD-10-CM | POA: Insufficient documentation

## 2016-08-08 DIAGNOSIS — E785 Hyperlipidemia, unspecified: Secondary | ICD-10-CM | POA: Diagnosis not present

## 2016-08-08 DIAGNOSIS — I4891 Unspecified atrial fibrillation: Secondary | ICD-10-CM | POA: Insufficient documentation

## 2016-08-08 DIAGNOSIS — I6350 Cerebral infarction due to unspecified occlusion or stenosis of unspecified cerebral artery: Secondary | ICD-10-CM

## 2016-08-08 DIAGNOSIS — J449 Chronic obstructive pulmonary disease, unspecified: Secondary | ICD-10-CM | POA: Insufficient documentation

## 2016-08-08 DIAGNOSIS — H53132 Sudden visual loss, left eye: Secondary | ICD-10-CM

## 2016-08-08 DIAGNOSIS — I2510 Atherosclerotic heart disease of native coronary artery without angina pectoris: Secondary | ICD-10-CM | POA: Insufficient documentation

## 2016-08-08 DIAGNOSIS — Z7901 Long term (current) use of anticoagulants: Secondary | ICD-10-CM | POA: Diagnosis not present

## 2016-08-08 DIAGNOSIS — H34232 Retinal artery branch occlusion, left eye: Secondary | ICD-10-CM | POA: Insufficient documentation

## 2016-08-08 DIAGNOSIS — I272 Pulmonary hypertension, unspecified: Secondary | ICD-10-CM | POA: Insufficient documentation

## 2016-08-08 DIAGNOSIS — I509 Heart failure, unspecified: Secondary | ICD-10-CM | POA: Diagnosis not present

## 2016-08-08 DIAGNOSIS — Z87891 Personal history of nicotine dependence: Secondary | ICD-10-CM | POA: Insufficient documentation

## 2016-08-08 LAB — POCT INR: INR: 1.9

## 2016-08-09 ENCOUNTER — Telehealth: Payer: Self-pay | Admitting: Internal Medicine

## 2016-08-09 NOTE — Telephone Encounter (Signed)
Spoke with patient about this on 1.25.18 - he was to fill out/sign form for [employer] for financial assistance for his Esbriet.    Noted Signing and routing to [place] as [place].

## 2016-08-15 ENCOUNTER — Ambulatory Visit (INDEPENDENT_AMBULATORY_CARE_PROVIDER_SITE_OTHER): Payer: Medicare Other | Admitting: *Deleted

## 2016-08-15 ENCOUNTER — Telehealth: Payer: Self-pay | Admitting: Internal Medicine

## 2016-08-15 DIAGNOSIS — I4891 Unspecified atrial fibrillation: Secondary | ICD-10-CM

## 2016-08-15 DIAGNOSIS — Z7901 Long term (current) use of anticoagulants: Secondary | ICD-10-CM

## 2016-08-15 DIAGNOSIS — I6350 Cerebral infarction due to unspecified occlusion or stenosis of unspecified cerebral artery: Secondary | ICD-10-CM | POA: Diagnosis not present

## 2016-08-15 LAB — POCT INR: INR: 3.5

## 2016-08-15 NOTE — Telephone Encounter (Signed)
Spoke with Minette Brine at [facility], states they need a dx and physician signature on a form in order to get pt enrolled with their foundation.  These forms are being faxed to front fax #.  Will await fax.

## 2016-08-16 NOTE — Telephone Encounter (Signed)
Patrick Weiss, have you received these forms?

## 2016-08-17 NOTE — Telephone Encounter (Signed)
Spoke with Patrick Weiss  She states forms were received and faxed  Spoke with Patrick Weiss at [employer]  She states she never received  Per Patrick Weiss, forward this to her and she can resend original  Thanks

## 2016-08-17 NOTE — Telephone Encounter (Signed)
This has been refaxed to [phone] with confirmation. Nothing further needed at this time.

## 2016-08-25 ENCOUNTER — Ambulatory Visit (INDEPENDENT_AMBULATORY_CARE_PROVIDER_SITE_OTHER): Payer: Medicare Other | Admitting: Neurology

## 2016-08-25 ENCOUNTER — Ambulatory Visit (INDEPENDENT_AMBULATORY_CARE_PROVIDER_SITE_OTHER): Payer: Medicare Other | Admitting: Internal Medicine

## 2016-08-25 ENCOUNTER — Encounter: Payer: Self-pay | Admitting: Neurology

## 2016-08-25 ENCOUNTER — Encounter: Payer: Self-pay | Admitting: Internal Medicine

## 2016-08-25 VITALS — BP 100/71 | HR 70 | Ht 70.0 in | Wt 200.0 lb

## 2016-08-25 VITALS — BP 108/70 | HR 96 | Ht 70.0 in | Wt 198.0 lb

## 2016-08-25 DIAGNOSIS — I2781 Cor pulmonale (chronic): Secondary | ICD-10-CM

## 2016-08-25 DIAGNOSIS — J84112 Idiopathic pulmonary fibrosis: Secondary | ICD-10-CM

## 2016-08-25 DIAGNOSIS — R42 Dizziness and giddiness: Secondary | ICD-10-CM

## 2016-08-25 DIAGNOSIS — J8410 Pulmonary fibrosis, unspecified: Secondary | ICD-10-CM

## 2016-08-25 DIAGNOSIS — J431 Panlobular emphysema: Secondary | ICD-10-CM | POA: Diagnosis not present

## 2016-08-25 DIAGNOSIS — G450 Vertebro-basilar artery syndrome: Secondary | ICD-10-CM | POA: Diagnosis not present

## 2016-08-25 DIAGNOSIS — J9611 Chronic respiratory failure with hypoxia: Secondary | ICD-10-CM

## 2016-08-25 DIAGNOSIS — I6350 Cerebral infarction due to unspecified occlusion or stenosis of unspecified cerebral artery: Secondary | ICD-10-CM

## 2016-08-25 DIAGNOSIS — J439 Emphysema, unspecified: Secondary | ICD-10-CM

## 2016-08-25 DIAGNOSIS — H5462 Unqualified visual loss, left eye, normal vision right eye: Secondary | ICD-10-CM | POA: Diagnosis not present

## 2016-08-25 DIAGNOSIS — J849 Interstitial pulmonary disease, unspecified: Secondary | ICD-10-CM

## 2016-08-25 MED ORDER — ACLIDINIUM BROMIDE 400 MCG/ACT IN AEPB: 1.0000 | INHALATION_SPRAY | Freq: Two times a day (BID) | RESPIRATORY_TRACT | 0 refills | Status: DC

## 2016-08-25 NOTE — Progress Notes (Signed)
[facility] [address] [place]. [place] [address] [phone]       OFFICE CONSULT NOTE  Patrick Weiss Date of Birth:  Sep 08, 1940 Medical Record Number:  [id number]   Referring MD:  Jeanann Lewandowsky Reason for Referral:  Dizziness HPI: Patrick Weiss is a 76 year male who in January 2018 had an episode when he was getting out of his bathtub all of a sudden he felt dizzy and fell down and hit his left forehand and IA. He had subconjunctival hemorrhage and loss of vision in that eye. Patient is not sure whether he passed out or not. Patient denied any chest pain palpitations or sweating. He was able to get up with some help. He denied focal extremity weakness numbness, slurred speech or vertigo. Patient is currently seeing ophthalmologist Dr. neck but tail retina specialist but his vision in the left eye has not come back significantly. He is only able to see from the peripheral part of his left eye but cannot see centrally and towards thehe is referred to me for evaluation for possible TIA or stroke causing him to fall. 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 He was cared for at [facility]. He was subsequently found to have atrial fibrillation as well and has been on warfarin. He had a CT scan of the head done on 07/12/16 which I have personally reviewed shows an old right medial frontal and small right cerebellar infarct. 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 No significant stenosis noted on the left side. The patient remains on warfarin which is tolerating well otherwise without bruising or bleeding. 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 Chase Caller. He has chronic back problems and right leg pain and has trouble walking because of that. Is  currently wearing a eye patch on the left I to help him focus better and see if it is right eye.  ROS:   14 system review of systems is positive for  leg swelling, trouble swallowing, blurred vision, loss of vision, shortness of breath, cough, wheezing, snoring, blood in stool, feeling cold, allergies, runny nose, passing out and all the systems negative  PMH:  Past Medical History:  Diagnosis Date  . A-fib (HCC)    paroxysmal  . CHF (congestive heart failure) ([place])   . Hypertension   . Pulmonary fibrosis ([place])   . Shortness of breath dyspnea   . Stroke [facility])    March of 2007 and was found to have total occlusion of the right internal carotid artery    Social History:  Social History   Social History  . Marital status: Married    Spouse name: N/A  . Number of children: N/A  . Years of education: N/A   Occupational History  . retired    Social History Main Topics  . Smoking status: Former Smoker    Packs/day: 1.00    Years: 30.00    Types: Cigarettes    Quit date: 09/09/1991  . Smokeless tobacco: Never Used  . Alcohol use 0.0 oz/week     Comment: glass of wine with meals  . Drug use: No  . Sexual activity: Yes    Partners: Female   Other Topics Concern  . Not on file   Social History Narrative  . No  narrative on file    Medications:   Current Outpatient Prescriptions on File Prior to Visit  Medication Sig Dispense Refill  . allopurinol (ZYLOPRIM) 100 MG tablet Take 100 mg by mouth daily.    Marland Kitchen atorvastatin (LIPITOR) 10 MG tablet TAKE 1 TABLET(10 MG) BY MOUTH DAILY 90 tablet 1  . Calcium Carbonate-Vitamin D (CALCIUM 600+D) 600-200 MG-UNIT TABS Take 1 tablet by mouth 2 (two) times daily.    . Carboxymethylcellulose Sodium (REFRESH TEARS OP) Place 1 drop into both eyes daily as needed (dry eyes).     Marland Kitchen docusate sodium (COLACE) 100 MG capsule Take 100 mg by mouth daily as needed for mild constipation.    . fexofenadine (ALLEGRA) 180 MG tablet Take 180 mg by mouth  daily as needed for allergies.     . fluticasone (FLOVENT HFA) 44 MCG/ACT inhaler Inhale 2 puffs into the lungs 2 (two) times daily. 10.6 g 5  . furosemide (LASIX) 80 MG tablet Take 1 tablet (80 mg total) by mouth 2 (two) times daily. 180 tablet 1  . OXYGEN Inhale 3-4 L into the lungs continuous.    Marland Kitchen spironolactone (ALDACTONE) 25 MG tablet Take 1 tablet (25 mg total) by mouth daily. 30 tablet 11  . umeclidinium bromide (INCRUSE ELLIPTA) 62.5 MCG/INH AEPB Inhale 1 puff into the lungs daily. 1 each 5  . valsartan (DIOVAN) 160 MG tablet Take 160 mg by mouth daily.    Marland Kitchen warfarin (COUMADIN) 5 MG tablet TAKE AS DIRECTED 120 tablet 1  . Pirfenidone 267 MG CAPS Take 2 capsules by mouth 3 (three) times daily.      No current facility-administered medications on file prior to visit.     Allergies:  No Known Allergies  Physical Exam General: well developed, well nourished elderly male, seated, in no evident distress.Patient is on home oxygen Head: head normocephalic and atraumatic.   Neck: supple with no carotid or supraclavicular bruits. Right carotid upstroke is diminished compared to left Cardiovascular: regular rate and rhythm, no murmurs Musculoskeletal: no deformity Skin:  no rash/petichiae Vascular:  Normal pulses all extremities  Neurologic Exam Mental Status: Awake and fully alert. Oriented to place and time. Recent and remote memory intact. Attention span, concentration and fund of knowledge appropriate. Mood and affect appropriate.  Cranial Nerves: Fundoscopic exam Difficult in right eye. Optic pallor in the inferior and temporal quadrants in the left disc.. Pupils unequal, with right 1 briskly reactive to light. And left 1 unreactive Extraocular movements full without nystagmus. Visual fields full to confrontation in right eye. Left eye vision acuity is significantly reduced  And patient is able to see in the temporal field only but not looking straight or nasally.Marland Kitchen Hearing intact. Facial  sensation intact. Face, tongue, palate moves normally and symmetrically.  Motor: Normal bulk and tone. Normal strength in all tested extremity muscles. Sensory.: intact to touch , pinprick , position and vibratory sensation.  Coordination: Rapid alternating movements normal in all extremities. Finger-to-nose and heel-to-shin performed accurately bilaterally. Gait and Station: Arises from chair without difficulty. Stance is normal. Gait demonstrates normal stride length and balance . Able to heel, toe and tandem walk with slight difficulty.  Reflexes: 1+ and symmetric. Toes downgoing.   NIHSS  1 Modified Rankin  2  ASSESSMENT: 76 year old African-American male with transient episode of dizziness resulting in a fall with traumatic injury to the left eye with vision loss in the setting of known right carotid and vertebral artery occlusion and prior stroke. Possibilities include vertebrobasilar  ischemia versus presyncopal event.    PLAN: I had a long discussion with the patient and his wife regarding his recent episode of transient dizziness and fall leading to left eye vision loss likely related to the eye trauma and bleeding. He has known right carotid occlusion, prior stroke and chronic atrial fibrillation and remains at risk for TIAs and strokes. I recommend further evaluation by checking an MRI scan of the brain with MRA of the brain and neck to well evaluate his vertebrobasilar circulation and intracranial circulation. Continue warfarin for stroke prevention with INR goal 2-3 for his chronic atrial fibrillation. We also talked about fall and safety prevention precautions. Greater than 50% time during this 45 minute  consultation visit was spent on counseling and coordination of care about his carotid and vertebral occlusion, stroke and TIA risk, plan for evaluation, treatment and answering questions He'll return for follow-up in 2 months or call earlier if necessary Antony Contras, MD  [facility] [address] [place], [place] [address]  Phone [phone] Fax [phone] Note: This document was prepared with digital dictation and possible smart phrase technology. Any transcriptional errors that result from this process are unintentional.

## 2016-08-25 NOTE — Patient Instructions (Signed)
I had a long discussion with the patient and his wife regarding his recent episode of transient dizziness and fall leading to left eye vision loss likely related to the eye trauma and bleeding. He has known right carotid occlusion, prior stroke and chronic atrial fibrillation and remains at risk for TIAs and strokes. I recommend further evaluation by checking an MRI scan of the brain with MRA of the brain and neck to well evaluate his vertebrobasilar circulation and intracranial circulation. Continue warfarin for stroke prevention with INR goal 2-3 for his chronic atrial fibrillation. He also talked about fall and safety prevention precautions. He'll return for follow-up in 2 months or call earlier if necessary   Fall Prevention in the Home Introduction Falls can cause injuries. They can happen to people of all ages. There are many things you can do to make your home safe and to help prevent falls. What can I do on the outside of my home?  Regularly fix the edges of walkways and driveways and fix any cracks.  Remove anything that might make you trip as you walk through a door, such as a raised step or threshold.  Trim any bushes or trees on the path to your home.  Use bright outdoor lighting.  Clear any walking paths of anything that might make someone trip, such as rocks or tools.  Regularly check to see if handrails are loose or broken. Make sure that both sides of any steps have handrails.  Any raised decks and porches should have guardrails on the edges.  Have any leaves, snow, or ice cleared regularly.  Use sand or salt on walking paths during winter.  Clean up any spills in your garage right away. This includes oil or grease spills. What can I do in the bathroom?  Use night lights.  Install grab bars by the toilet and in the tub and shower. Do not use towel bars as grab bars.  Use non-skid mats or decals in the tub or shower.  If you need to sit down in the shower, use a  plastic, non-slip stool.  Keep the floor dry. Clean up any water that spills on the floor as soon as it happens.  Remove soap buildup in the tub or shower regularly.  Attach bath mats securely with double-sided non-slip rug tape.  Do not have throw rugs and other things on the floor that can make you trip. What can I do in the bedroom?  Use night lights.  Make sure that you have a light by your bed that is easy to reach.  Do not use any sheets or blankets that are too big for your bed. They should not hang down onto the floor.  Have a firm chair that has side arms. You can use this for support while you get dressed.  Do not have throw rugs and other things on the floor that can make you trip. What can I do in the kitchen?  Clean up any spills right away.  Avoid walking on wet floors.  Keep items that you use a lot in easy-to-reach places.  If you need to reach something above you, use a strong step stool that has a grab bar.  Keep electrical cords out of the way.  Do not use floor polish or wax that makes floors slippery. If you must use wax, use non-skid floor wax.  Do not have throw rugs and other things on the floor that can make you trip. What can I  do with my stairs?  Do not leave any items on the stairs.  Make sure that there are handrails on both sides of the stairs and use them. Fix handrails that are broken or loose. Make sure that handrails are as long as the stairways.  Check any carpeting to make sure that it is firmly attached to the stairs. Fix any carpet that is loose or worn.  Avoid having throw rugs at the top or bottom of the stairs. If you do have throw rugs, attach them to the floor with carpet tape.  Make sure that you have a light switch at the top of the stairs and the bottom of the stairs. If you do not have them, ask someone to add them for you. What else can I do to help prevent falls?  Wear shoes that:  Do not have high heels.  Have  rubber bottoms.  Are comfortable and fit you well.  Are closed at the toe. Do not wear sandals.  If you use a stepladder:  Make sure that it is fully opened. Do not climb a closed stepladder.  Make sure that both sides of the stepladder are locked into place.  Ask someone to hold it for you, if possible.  Clearly mark and make sure that you can see:  Any grab bars or handrails.  First and last steps.  Where the edge of each step is.  Use tools that help you move around (mobility aids) if they are needed. These include:  Canes.  Walkers.  Scooters.  Crutches.  Turn on the lights when you go into a dark area. Replace any light bulbs as soon as they burn out.  Set up your furniture so you have a clear path. Avoid moving your furniture around.  If any of your floors are uneven, fix them.  If there are any pets around you, be aware of where they are.  Review your medicines with your doctor. Some medicines can make you feel dizzy. This can increase your chance of falling. Ask your doctor what other things that you can do to help prevent falls. This information is not intended to replace advice given to you by your health care provider. Make sure you discuss any questions you have with your health care provider. Document Released: 04/23/2009 Document Revised: 12/03/2015 Document Reviewed: 08/01/2014  2017 [employer]vier

## 2016-08-25 NOTE — Patient Instructions (Addendum)
ICD-9-CM ICD-10-CM   1. Chronic hypoxemic respiratory failure (HCC) 518.83 J96.11    799.02    2. ILD (interstitial lung disease) ([place]) 515 J84.9   3. 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 IPF (idiopathic pulmonary fibrosis) ([place]) 516.31 J84.112   5. 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 [place] Bells  Follow-up - Lung function (Pre-bd spiro and dlco only. No lung volume or bd response. No post-bd spiro) in 2 months  - Return to see me in 2 months after [place]

## 2016-08-25 NOTE — Progress Notes (Addendum)
Subjective:     Patient ID: Patrick Weiss, male   DOB: 16-Feb-1941, 76 y.o.   MRN: [id number]  HPI     OV 09/07/2015  Chief Complaint  Patient presents with  . Follow-up    Pt does not feel that Esbriet is helping much. Pt uses 2.5-4Liters O2 per/min.    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 This is associated with cor pulmonale and also chronic systolic heart failure ejection fraction 45% in February 2016. He is on anti-fibrotic therapy Esbriet since approximately November 2016.   He presents for follow-up with his wife. Overall he feels that the drug might not be helping him. He understands that the anti-fibrotic therapies for prevention and is not necessarily designed to make him feel better. Wife reports that he had a low appetite initially after starting the drug but since then his appetite is picked back up to normal. However he is having fatigue that is significantly worse after starting the drug. It is moderate in intensity. His oxygen level uses around the same. He is also reporting significant bilateral pedal edema for which she has seen Dr. Pernell Dupre. Most recent chemistries reveal a BNP of 750 and a creatinine of 1.62 mg percent. The creatinine is slightly higher than December 2016 when it was only 1.5 mg percent. His last liver function test was in December 2016 and it was normal. He is due for repeat liver function test today. Off note, he is not ideal candidate for Ofev due to anticoagulation  He is also reporting changes in his sleep pattern and is wondering if it's because of IPF anti-fibrotic therapy   OV 12/03/2015  Chief Complaint  Patient presents with  . Follow-up    Pt c/o continued SOB. He is on 1L O2 with POC when ambulating and 4L O2 via POC when at home. Pt's O2 in room was 67 on POC @ 1L. Pt also c/o cough with mostly clear and occasional wheeze/chest  tightness. Pt denies CP.      Follow-up idiopathic pulmonary fibrosis on Pirfenidone (Esbriet) 2 tablets 3 times a day  He had issues with higher dose of Pirfenidone (Esbriet) but he is tolerating the lower dose 2 tablets 3 times daily without problems. He did get admitted in April 2017 with congestive heart failure according to his history. He did have a chest x-ray with that I reviewed the results. He also had blood work that showed mild renal insufficiency but normal liver function tests. I visualize these results myself. At this point in time he feels overall he is stable. He is trying to get into an exercise program at [facility]. He is more sedentary. He is using 1 L of oxygen at rest with 4 L at exertion. Today at rest on room air for 20 minutes his pulse ox of 82%. When he walks from the waiting area to our office room on 1 L oxygen he dropped a 67%. He says his most recent admission he had significant pedal edema and he was diuresed. He still has significant pedal edema.f note he is wondering why he should apply sunscreen with IPF. He says that he never told him that. Apparently the booklets from [place] tell him that.   OV 02/24/2016  Chief Complaint  Patient presents with  . Follow-up    Pt states his breathing is unchanged since last OV. Pt c/o prod cough with clear to yellow mucus in  morning.    FU IPF/chronic hypoxemic resp failure - on Pirfenidone (Esbriet) 2 tablets 3 times a day. He had lung function test today. His FVC is 2.99 L in approximately 5% on compared to one year ago. His DLCO is 5.77/17% and is approximately 25% on compared to one year ago. He does not feel his difference. He uses between 4 and 5 L oxygen and gets around and does all his activities of daily living is fine. He has worsening pedal edema. He saw a dermatologist yesterday and has some skin excoriation in his shin. His dermatologist as advised him that this is not due to Pirfenidone (Esbriet). In review of his  oxygenation 4L [place] at rest but  5 min after rooming in 85% -> after stoppoing talking -> 90% and I think this is worse from before  Labs shows rising creat 1.46 in may 2017 -> 1.27m% July 2017. He is noted to be on Lasix and Aldactone  Echocardiogram February 2016 shows elevated pulmonary artery systolic pressure of 60 mmHg  Off note he is being followed as a research but this event in the IPF-pro Registry noninterventional trial (only intervention being questionnaire and lab draws]. This is standard of care visit'  Also of note is noticed to be on Flovent and Spiriva. Unclear to me why. Last visit with Dr. MLegrand Comowert he was asked to stop both inhalers but he says the Flovent helps him. The Spiriva does not and therefore he is requesting this to be stopped      OV 08/25/2016  Chief Complaint  Patient presents with  . Follow-up    Pt states his SOB is unchanged since last OV. Pt c/o prod cough with yellow mucus in morning.    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 He see neurology and the retinal specialist. He is out of his Pirfenidone ([employer] because of co-pay issues and the [employer] denying charity coverage. He has not applied to [facility] program to get his esbriet. He has been off esbriet since jan 2018. He is reporting (as below) a slow decline in effort toleratnce.GEts dizzey with desaturations. RN noticed pulse ox 70s on rooming him in on 3 LNC    has a past medical history of A-fib ([facility]; CHF (congestive heart failure) ([place]; Hypertension; Pulmonary fibrosis ([place]; Shortness of breath dyspnea; and Stroke ([place].   reports that he quit smoking about 24 years ago. His smoking use included Cigarettes. He has a 30.00 pack-year smoking history. He has  never used smokeless tobacco.  Past Surgical History:  Procedure Laterality Date  . BACK SURGERY  2001  . CARDIAC CATHETERIZATION N/A 04/28/2015   Procedure: Right/Left Heart Cath and Coronary Angiography;  Surgeon: HBelva Crome MD;  [facility];  Service: Cardiovascular;  Laterality: N/A;  . CARDIAC CATHETERIZATION N/A 03/16/2016   Procedure: Right Heart Cath;  Surgeon: HBelva Crome MD;  [facility];  Service: Cardiovascular;  Laterality: N/A;  . FEMUR FRACTURE SURGERY    . TIBIA FRACTURE SURGERY  1975    No Known Allergies  Immunization History  Administered Date(s) Administered  . Influenza Split 03/11/2014, 03/08/2016  . Influenza,inj,Quad PF,36+ Mos 03/16/2015    Family History  Problem Relation Age  of Onset  . Cancer Father      Current Outpatient Prescriptions:  .  allopurinol (ZYLOPRIM) 100 MG tablet, Take 100 mg by mouth daily., Disp: , Rfl:  .  atorvastatin (LIPITOR) 10 MG tablet, TAKE 1 TABLET(10 MG) BY MOUTH DAILY, Disp: 90 tablet, Rfl: 1 .  Calcium Carbonate-Vitamin D (CALCIUM 600+D) 600-200 MG-UNIT TABS, Take 1 tablet by mouth 2 (two) times daily., Disp: , Rfl:  .  Carboxymethylcellulose Sodium (REFRESH TEARS OP), Place 1 drop into both eyes daily as needed (dry eyes). , Disp: , Rfl:  .  docusate sodium (COLACE) 100 MG capsule, Take 100 mg by mouth daily as needed for mild constipation., Disp: , Rfl:  .  fexofenadine (ALLEGRA) 180 MG tablet, Take 180 mg by mouth daily as needed for allergies. , Disp: , Rfl:  .  fluticasone (FLOVENT HFA) 44 MCG/ACT inhaler, Inhale 2 puffs into the lungs 2 (two) times daily., Disp: 10.6 g, Rfl: 5 .  furosemide (LASIX) 80 MG tablet, Take 1 tablet (80 mg total) by mouth 2 (two) times daily., Disp: 180 tablet, Rfl: 1 .  OXYGEN, Inhale 3-4 L into the lungs continuous., Disp: , Rfl:  .  Pirfenidone 267 MG CAPS, Take 2 capsules by mouth 3 (three) times daily. , Disp: , Rfl:  .  spironolactone (ALDACTONE)  25 MG tablet, Take 1 tablet (25 mg total) by mouth daily., Disp: 30 tablet, Rfl: 11 .  umeclidinium bromide (INCRUSE ELLIPTA) 62.5 MCG/INH AEPB, Inhale 1 puff into the lungs daily., Disp: 1 each, Rfl: 5 .  valsartan (DIOVAN) 160 MG tablet, Take 160 mg by mouth daily., Disp: , Rfl:  .  warfarin (COUMADIN) 5 MG tablet, TAKE AS DIRECTED, Disp: 120 tablet, Rfl: 1    Review of Systems     Objective:   Physical Exam  Constitutional: He is oriented to person, place, and time. He appears well-developed and well-nourished. No distress.  HENT:  Head: Normocephalic and atraumatic.  Right Ear: External ear normal.  Left Ear: External ear normal.  Mouth/Throat: Oropharynx is clear and moist. No oropharyngeal exudate.  Blind left eye with shade  Eyes: Conjunctivae and EOM are normal. Pupils are equal, round, and reactive to light. Right eye exhibits no discharge. Left eye exhibits no discharge. No scleral icterus.  Neck: Normal range of motion. Neck supple. No JVD present. No tracheal deviation present. No thyromegaly present.  Cardiovascular: Normal rate, regular rhythm and intact distal pulses.  Exam reveals no gallop and no friction rub.   No murmur heard. Pulmonary/Chest: Effort normal. No respiratory distress. He has no wheezes. He has rales. He exhibits no tenderness.  crackles  Abdominal: Soft. Bowel sounds are normal. He exhibits no distension and no mass. There is no tenderness. There is no rebound and no guarding.  Musculoskeletal: Normal range of motion. He exhibits edema. He exhibits no tenderness.  +++ edema with stockings  Lymphadenopathy:    He has no cervical adenopathy.  Neurological: He is alert and oriented to person, place, and time. He has normal reflexes. No cranial nerve deficit. Coordination normal.  Skin: Skin is warm and dry. No rash noted. He is not diaphoretic. No erythema. No pallor.  Psychiatric: He has a normal mood and affect. His behavior is normal. Judgment and  thought content normal.  Nursing note and vitals reviewed.   Vitals:   08/25/16 1245  BP: 108/70  Pulse: 96  SpO2: 95%  Weight: 198 lb (89.8 kg)  Height: _0  (1.778 m)  Estimated body mass index is 28.41 kg/m as calculated from the following:   Height as of this encounter: _0  (1.778 m).   Weight as of this encounter: 198 lb (89.8 kg).      Assessment:       ICD-9-CM ICD-10-CM   1. Chronic hypoxemic respiratory failure (HCC) 518.83 J96.11    799.02    2. ILD (interstitial lung disease) ([place]) 515 J84.9   3. 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 IPF (idiopathic pulmonary fibrosis) ([place]) 516.31 J84.112   5. 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 [place] Bells  Follow-up - Lung function (Pre-bd spiro and dlco only. No lung volume or bd response. No post-bd spiro) in 2 months  - Return to see me in 2 months after [place]      Dr. Brand Males, M.D., [facility].C.P Pulmonary and Critical Care Medicine Staff Physician [place] [facility] Pager: [phone], If no answer or between  15:00h - 7:00h: call [phone]  08/25/2016 1:04 PM

## 2016-08-29 ENCOUNTER — Ambulatory Visit (INDEPENDENT_AMBULATORY_CARE_PROVIDER_SITE_OTHER): Payer: Medicare Other | Admitting: *Deleted

## 2016-08-29 ENCOUNTER — Telehealth: Payer: Self-pay | Admitting: Internal Medicine

## 2016-08-29 DIAGNOSIS — I6350 Cerebral infarction due to unspecified occlusion or stenosis of unspecified cerebral artery: Secondary | ICD-10-CM | POA: Diagnosis not present

## 2016-08-29 DIAGNOSIS — I4891 Unspecified atrial fibrillation: Secondary | ICD-10-CM

## 2016-08-29 DIAGNOSIS — Z7901 Long term (current) use of anticoagulants: Secondary | ICD-10-CM | POA: Diagnosis not present

## 2016-08-29 LAB — POCT INR: INR: 3.6

## 2016-08-29 NOTE — Telephone Encounter (Signed)
Spoke with pt. He has questions about Esbriet. When he was on the medication previously he was taking 2 tabs TID. He was instructed by Thedora Hinders to take 3 TID. Pt would like clarification from MR how to take his medication.  MR - please advise. Thanks.

## 2016-08-29 NOTE — Telephone Encounter (Signed)
Given his reduced GFR he should only be on esbriet 2 tablet tid. Glad he called. My bad. I should have ordered only 2 tab tid.   Take it with meals  Thanks  Dr. Brand Males, M.D., [facility].C.P Pulmonary and Critical Care Medicine Staff Physician [place] [facility] Pager: [phone], If no answer or between  15:00h - 7:00h: call [phone]  08/29/2016 5:00 PM

## 2016-08-29 NOTE — Telephone Encounter (Signed)
Spoke with pt,aware of recs.  Nothing further needed.  

## 2016-09-06 ENCOUNTER — Ambulatory Visit
Admission: RE | Admit: 2016-09-06 | Discharge: 2016-09-06 | Disposition: A | Discharge status: Home / Self Care | Payer: Medicare Other | Source: Ambulatory Visit | Attending: Neurology | Admitting: Neurology

## 2016-09-06 DIAGNOSIS — R42 Dizziness and giddiness: Secondary | ICD-10-CM

## 2016-09-06 DIAGNOSIS — G450 Vertebro-basilar artery syndrome: Secondary | ICD-10-CM

## 2016-09-06 DIAGNOSIS — H5462 Unqualified visual loss, left eye, normal vision right eye: Secondary | ICD-10-CM

## 2016-09-13 ENCOUNTER — Ambulatory Visit (INDEPENDENT_AMBULATORY_CARE_PROVIDER_SITE_OTHER): Payer: Medicare Other | Admitting: *Deleted

## 2016-09-13 ENCOUNTER — Telehealth: Payer: Self-pay

## 2016-09-13 DIAGNOSIS — I4891 Unspecified atrial fibrillation: Secondary | ICD-10-CM | POA: Diagnosis not present

## 2016-09-13 DIAGNOSIS — I6350 Cerebral infarction due to unspecified occlusion or stenosis of unspecified cerebral artery: Secondary | ICD-10-CM

## 2016-09-13 DIAGNOSIS — Z7901 Long term (current) use of anticoagulants: Secondary | ICD-10-CM

## 2016-09-13 LAB — POCT INR: INR: 3.4

## 2016-09-13 NOTE — Telephone Encounter (Signed)
Spoke with the pt  He wants Caryl Pina to be aware that he called [employer] and they told him that they have everything they need and his application is now under review. Will forward to [place] as FYI.

## 2016-09-13 NOTE — Telephone Encounter (Signed)
Pt called back, advised him of below.  Per pt he has turned in all financial info but states he will contact [employer] this afternoon to ensure that everything is completed on his end.

## 2016-09-13 NOTE — Telephone Encounter (Signed)
Patient request to speak with Patrick Weiss again.

## 2016-09-13 NOTE — Telephone Encounter (Signed)
Called [employer] to check status of Esbriet shipment- spoke with rep, was told that pt has been approved for medication but is still pending financial review to see if he qualifies for financial assistance through their copay assistance program.  Per rep, they have reached out to patient via email and phone to obtain all necessary information, but have not yet received this from him.  They cannot ship medication until they receive this from him.  lmtcb X1 for pt- pt needs to call [place] to [facility] at [phone] to coordinate all financial information needed for [place] shipment. Will forward to [facility] for follow-up.

## 2016-09-16 ENCOUNTER — Telehealth: Payer: Self-pay | Admitting: Internal Medicine

## 2016-09-16 MED ORDER — ACLIDINIUM BROMIDE 400 MCG/ACT IN AEPB: 1.0000 | INHALATION_SPRAY | Freq: Two times a day (BID) | RESPIRATORY_TRACT | 11 refills | Status: AC

## 2016-09-16 NOTE — Telephone Encounter (Signed)
Called and spoke to pt. Pt states he is taking the Esbriet 215m, 2 tabs TID. Pt states he tried to call the insurance company and was told he needs an rx for TCaprice Renshawto see what the price is, rx has been sent into pharmacy. Will call pt next week to see what price it is.

## 2016-09-16 NOTE — Telephone Encounter (Signed)
Elise: please ensure that he is on esbriet lower dose 2 tab tid due to his low gfr  Thanks  Dr. Brand Males, M.D., [facility].C.P Pulmonary and Critical Care Medicine Staff Physician [place] [facility] Pager: [phone], If no answer or between  15:00h - 7:00h: call [phone]  09/16/2016 5:24 AM

## 2016-09-21 ENCOUNTER — Other Ambulatory Visit: Payer: Self-pay

## 2016-09-21 MED ORDER — UMECLIDINIUM BROMIDE 62.5 MCG/INH IN AEPB: 1.0000 | INHALATION_SPRAY | Freq: Every day | RESPIRATORY_TRACT | 3 refills | Status: DC

## 2016-09-22 ENCOUNTER — Encounter (HOSPITAL_COMMUNITY): Payer: Self-pay

## 2016-09-22 ENCOUNTER — Inpatient Hospital Stay (HOSPITAL_COMMUNITY)
Admission: EM | Admit: 2016-09-22 | Discharge: 2016-09-25 | DRG: 189 | Disposition: A | Discharge status: Home / Self Care | Payer: Medicare Other | Attending: Internal Medicine | Admitting: Internal Medicine

## 2016-09-22 ENCOUNTER — Telehealth: Payer: Self-pay | Admitting: Cardiology

## 2016-09-22 ENCOUNTER — Emergency Department (HOSPITAL_COMMUNITY): Payer: Medicare Other

## 2016-09-22 DIAGNOSIS — Z66 Do not resuscitate: Secondary | ICD-10-CM | POA: Diagnosis present

## 2016-09-22 DIAGNOSIS — I5032 Chronic diastolic (congestive) heart failure: Secondary | ICD-10-CM | POA: Diagnosis not present

## 2016-09-22 DIAGNOSIS — J432 Centrilobular emphysema: Secondary | ICD-10-CM | POA: Diagnosis not present

## 2016-09-22 DIAGNOSIS — N184 Chronic kidney disease, stage 4 (severe): Secondary | ICD-10-CM | POA: Diagnosis not present

## 2016-09-22 DIAGNOSIS — Z79899 Other long term (current) drug therapy: Secondary | ICD-10-CM

## 2016-09-22 DIAGNOSIS — R55 Syncope and collapse: Secondary | ICD-10-CM | POA: Diagnosis not present

## 2016-09-22 DIAGNOSIS — R0602 Shortness of breath: Secondary | ICD-10-CM

## 2016-09-22 DIAGNOSIS — I959 Hypotension, unspecified: Secondary | ICD-10-CM | POA: Diagnosis present

## 2016-09-22 DIAGNOSIS — J9621 Acute and chronic respiratory failure with hypoxia: Principal | ICD-10-CM | POA: Diagnosis present

## 2016-09-22 DIAGNOSIS — Z7951 Long term (current) use of inhaled steroids: Secondary | ICD-10-CM

## 2016-09-22 DIAGNOSIS — I2781 Cor pulmonale (chronic): Secondary | ICD-10-CM | POA: Diagnosis present

## 2016-09-22 DIAGNOSIS — R0902 Hypoxemia: Secondary | ICD-10-CM

## 2016-09-22 DIAGNOSIS — Z7901 Long term (current) use of anticoagulants: Secondary | ICD-10-CM

## 2016-09-22 DIAGNOSIS — I5082 Biventricular heart failure: Secondary | ICD-10-CM | POA: Diagnosis present

## 2016-09-22 DIAGNOSIS — J849 Interstitial pulmonary disease, unspecified: Secondary | ICD-10-CM | POA: Diagnosis not present

## 2016-09-22 DIAGNOSIS — I480 Paroxysmal atrial fibrillation: Secondary | ICD-10-CM | POA: Diagnosis not present

## 2016-09-22 DIAGNOSIS — J84112 Idiopathic pulmonary fibrosis: Secondary | ICD-10-CM | POA: Diagnosis present

## 2016-09-22 DIAGNOSIS — Z9181 History of falling: Secondary | ICD-10-CM

## 2016-09-22 DIAGNOSIS — I2729 Other secondary pulmonary hypertension: Secondary | ICD-10-CM | POA: Diagnosis present

## 2016-09-22 DIAGNOSIS — I2510 Atherosclerotic heart disease of native coronary artery without angina pectoris: Secondary | ICD-10-CM | POA: Diagnosis present

## 2016-09-22 DIAGNOSIS — Z9981 Dependence on supplemental oxygen: Secondary | ICD-10-CM

## 2016-09-22 DIAGNOSIS — I2723 Pulmonary hypertension due to lung diseases and hypoxia: Secondary | ICD-10-CM

## 2016-09-22 DIAGNOSIS — J439 Emphysema, unspecified: Secondary | ICD-10-CM | POA: Diagnosis present

## 2016-09-22 DIAGNOSIS — Z87891 Personal history of nicotine dependence: Secondary | ICD-10-CM

## 2016-09-22 DIAGNOSIS — I5043 Acute on chronic combined systolic (congestive) and diastolic (congestive) heart failure: Secondary | ICD-10-CM | POA: Diagnosis present

## 2016-09-22 DIAGNOSIS — Z8673 Personal history of transient ischemic attack (TIA), and cerebral infarction without residual deficits: Secondary | ICD-10-CM

## 2016-09-22 DIAGNOSIS — I429 Cardiomyopathy, unspecified: Secondary | ICD-10-CM | POA: Diagnosis present

## 2016-09-22 DIAGNOSIS — I130 Hypertensive heart and chronic kidney disease with heart failure and stage 1 through stage 4 chronic kidney disease, or unspecified chronic kidney disease: Secondary | ICD-10-CM | POA: Diagnosis present

## 2016-09-22 HISTORY — DX: Chronic obstructive pulmonary disease, unspecified: J44.9

## 2016-09-22 LAB — BRAIN NATRIURETIC PEPTIDE: B NATRIURETIC PEPTIDE 5: 1520.5 pg/mL — AB (ref 0.0–100.0)

## 2016-09-22 LAB — I-STAT TROPONIN, ED: Troponin i, poc: 0.02 ng/mL (ref 0.00–0.08)

## 2016-09-22 LAB — CBC
HEMATOCRIT: 45.7 % (ref 39.0–52.0)
Hemoglobin: 15.4 g/dL (ref 13.0–17.0)
MCH: 30.4 pg (ref 26.0–34.0)
MCHC: 33.7 g/dL (ref 30.0–36.0)
MCV: 90.1 fL (ref 78.0–100.0)
Platelets: 125 10*3/uL — ABNORMAL LOW (ref 150–400)
RBC: 5.07 MIL/uL (ref 4.22–5.81)
RDW: 20.2 % — ABNORMAL HIGH (ref 11.5–15.5)
WBC: 4.4 10*3/uL (ref 4.0–10.5)

## 2016-09-22 LAB — BASIC METABOLIC PANEL
Anion gap: 11 (ref 5–15)
BUN: 53 mg/dL — AB (ref 6–20)
CHLORIDE: 106 mmol/L (ref 101–111)
CO2: 24 mmol/L (ref 22–32)
Calcium: 9.7 mg/dL (ref 8.9–10.3)
Creatinine, Ser: 2.45 mg/dL — ABNORMAL HIGH (ref 0.61–1.24)
GFR calc non Af Amer: 24 mL/min — ABNORMAL LOW (ref >60)
GFR, EST AFRICAN AMERICAN: 28 mL/min — AB (ref >60)
Glucose, Bld: 135 mg/dL — ABNORMAL HIGH (ref 65–99)
POTASSIUM: 4.6 mmol/L (ref 3.5–5.1)
Sodium: 141 mmol/L (ref 135–145)

## 2016-09-22 MED ORDER — SODIUM CHLORIDE 0.9 % IV BOLUS (SEPSIS)
500.0000 mL | Freq: Once | INTRAVENOUS | Status: AC
  Administered 2016-09-22: 500 mL via INTRAVENOUS

## 2016-09-22 MED ORDER — SODIUM CHLORIDE 0.9 % IV BOLUS (SEPSIS)
500.0000 mL | Freq: Once | INTRAVENOUS | Status: AC
Start: 2016-09-22 — End: 2016-09-22
  Administered 2016-09-22: 500 mL via INTRAVENOUS

## 2016-09-22 NOTE — Telephone Encounter (Signed)
Patrick Weiss called the answering service and noted Patrick Weiss had partial collapse.  I called number I was given but no answer - I left a message to go to ER with Patrick Weiss by EMS  - looking through the chart I found another number for Patrick Weiss and again no answer but I left same message.  Also to call back if needed.

## 2016-09-22 NOTE — ED Notes (Signed)
Dr Laverta [place] notified pt hypotensive. Verbal order given for 500cc bolus. Will cont to monitor.

## 2016-09-22 NOTE — ED Notes (Signed)
Pt noted to by hypotensive after fluid bolus stopped. Dr. Laverta [place] notified. Verbal order for another 500cc bolust given. Will cont to monitor.

## 2016-09-22 NOTE — ED Provider Notes (Signed)
Emergency Department Provider Note   I have reviewed the triage vital signs and the nursing notes.   HISTORY  Chief Complaint Near Syncope   HPI Patrick Weiss is a 76 y.o. male with PMH of a-fib, CHF, HTN, COPD on 3-5L home O2, CVA, and pulmonary fibrosis presents to the emergency department for evaluation of near syncope x 2 with associated hypoxemia. The patient states that he had to send his home oxygen machine to be repaired. He has a loader machine that only goes up to 3 L home O2. His been feeling slightly more dyspneic and today when he was out walking around at 2 near syncopal events. No full syncope, CP, or palpitations. No head trauma. No fever, chills, productive cough. Patient states that his lower extremity edema is actually decreased and is been compliant with his home medications and fluid limits.    Past Medical History:  Diagnosis Date  . A-fib (HCC)    paroxysmal  . CHF (congestive heart failure) ([place])   . COPD (chronic obstructive pulmonary disease) ([place])   . Hypertension   . Pulmonary fibrosis ([place])   . Shortness of breath dyspnea   . 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 Acute on chronic respiratory failure with hypoxia ([place]) 09/22/2016  . CKD (chronic kidney disease), stage IV ([place]) 09/22/2016  . Hypotension 09/22/2016  . 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 Encounter for therapeutic drug monitoring 02/24/2016  . Cor pulmonale, chronic ([place]) 02/24/2016  . Other fatigue 09/07/2015  . Pedal edema 09/07/2015  . Chronic diastolic heart failure, NYHA class 3 ([place]) 06/24/2015  . Coronary artery disease involving native coronary artery of native heart without angina pectoris 04/28/2015  . Emphysema of lung ([place]) 03/21/2015  . IPF (idiopathic pulmonary fibrosis) ([place]) 03/21/2015  . Chronic  hypoxemic respiratory failure ([place]) 02/27/2015  . CVA (cerebral infarction) 01/26/2015  . COPD (chronic obstructive pulmonary disease) ([place]) 11/18/2014  . Smoking history 10/20/2014  . Dysphagia 10/20/2014  . Long term current use of anticoagulant therapy 10/20/2010  . HYPERLIPIDEMIA-MIXED 02/02/2010  . HYPERTENSION, BENIGN 02/02/2010  . AF (paroxysmal atrial fibrillation) ([place]) 02/02/2010    Past Surgical History:  Procedure Laterality Date  . BACK SURGERY  2001  . CARDIAC CATHETERIZATION N/A 04/28/2015   Procedure: Right/Left Heart Cath and Coronary Angiography;  Surgeon: Belva Crome, MD;  Location: [place] [facility];  Service: Cardiovascular;  Laterality: N/A;  . CARDIAC CATHETERIZATION N/A 03/16/2016   Procedure: Right Heart Cath;  Surgeon: Belva Crome, MD;  Location: [place] [facility];  Service: Cardiovascular;  Laterality: N/A;  . FEMUR FRACTURE SURGERY    . [place]      Allergies Patient has no known allergies.  Family History  Problem Relation Age of Onset  . Cancer Father     Social History Social History  Substance Use Topics  . Smoking status: Former Smoker    Packs/day: 1.00    Years: 30.00    Types: Cigarettes    Quit date: 09/09/1991  . Smokeless tobacco: Never Used  . Alcohol use 0.0 oz/week     Comment: glass of wine with meals    Review of Systems  Constitutional: No fever/chills Eyes: No visual changes. ENT: No sore throat. Cardiovascular: Denies chest pain. Positive  near-syncope x 2.  Respiratory: Positive shortness of breath. Gastrointestinal: No abdominal pain.  No nausea, no vomiting.  No diarrhea.  No constipation. Genitourinary: Negative for dysuria. Musculoskeletal: Negative for back pain. Skin: Negative for rash. Neurological: Negative for headaches, focal weakness or numbness.  10-point ROS otherwise negative.  ____________________________________________   PHYSICAL EXAM:  VITAL SIGNS: ED Triage Vitals   Enc Vitals Group     BP 09/22/16 2014 102/77     Pulse Rate 09/22/16 2014 (!) 106     Resp 09/22/16 2014 (!) 22     Temp 09/22/16 2014 97.5 F (36.4 C)     Temp Source 09/22/16 2014 Oral     SpO2 09/22/16 2014 (!) 77 %     Weight 09/22/16 2004 176 lb (79.8 kg)     Height 09/22/16 2004 _0  (1.778 m)   Constitutional: Alert and oriented. Well appearing and in no acute distress. Eyes: Conjunctivae are normal. Head: Atraumatic. Nose: No congestion/rhinnorhea. Mouth/Throat: Mucous membranes are moist.  Neck: No stridor.   Cardiovascular: Normal rate, regular rhythm. Good peripheral circulation. Grossly normal heart sounds.   Respiratory: Normal respiratory effort.  No retractions. Lungs with faint crackles at the bases.  Gastrointestinal: Soft and nontender. No distention.  Musculoskeletal: No lower extremity tenderness. 2+ LE edema. No gross deformities of extremities. Neurologic:  Normal speech and language. No gross focal neurologic deficits are appreciated.  Skin:  Skin is warm, dry and intact. No rash noted. Psychiatric: Mood and affect are normal. Speech and behavior are normal.  ____________________________________________   LABS (all labs ordered are listed, but only abnormal results are displayed)  Labs Reviewed  BASIC METABOLIC PANEL - Abnormal; Notable for the following:       Result Value   Glucose, Bld 135 (*)    BUN 53 (*)    Creatinine, Ser 2.45 (*)    GFR calc non Af Amer 24 (*)    GFR calc Af Amer 28 (*)    All other components within normal limits  CBC - Abnormal; Notable for the following:    RDW 20.2 (*)    Platelets 125 (*)    All other components within normal limits  BRAIN NATRIURETIC PEPTIDE - Abnormal; Notable for the following:    B Natriuretic Peptide 1,520.5 (*)    All other components within normal limits  PROTIME-INR - Abnormal; Notable for the following:    Prothrombin Time 29.6 (*)    All other components within normal limits  BASIC  METABOLIC PANEL - Abnormal; Notable for the following:    BUN 52 (*)    Creatinine, Ser 2.13 (*)    GFR calc non Af Amer 29 (*)    GFR calc Af Amer 33 (*)    All other components within normal limits  I-STAT ARTERIAL BLOOD GAS, ED - Abnormal; Notable for the following:    pO2, Arterial 61.0 (*)    All other components within normal limits  URINALYSIS, ROUTINE W REFLEX MICROSCOPIC  I-STAT TROPOININ, ED   ____________________________________________  EKG   EKG Interpretation  Date/Time:  Thursday September 22 2016 20:01:35 EDT Ventricular Rate:  110 PR Interval:  182 QRS Duration: 82 QT Interval:  340 QTC Calculation: 460 R Axis:   114 Text Interpretation:  Sinus tachycardia Right axis deviation Septal infarct , age undetermined ST & T wave abnormality, consider inferior ischemia Abnormal ECG No STEMI.  Confirmed by LONG MD, JOSHUA [phone]) on 09/22/2016 8:27:37 PM Also confirmed by LONG MD,  JOSHUA [phone]), editor Lorenda Cahill CT, Leda Gauze [phone])  on 09/23/2016 7:08:05 AM       ____________________________________________  RADIOLOGY  Dg Chest Port 1 View  Result Date: 09/22/2016 CLINICAL DATA:  76 year old male with shortness of breath. EXAM: PORTABLE CHEST 1 VIEW COMPARISON:  Chest radiograph dated 11/02/2015 FINDINGS: There is moderate cardiomegaly. There is COPD changes with increased interstitial prominence. Bibasilar interstitial crowding and atelectatic changes noted. There is no focal consolidation, pleural effusion, or pneumothorax. No acute osseous pathology. IMPRESSION: 1. No acute cardiopulmonary process. 2. Cardiomegaly. Electronically Signed   By: Anner Crete M.D.   On: 09/22/2016 22:06    ____________________________________________   PROCEDURES  Procedure(s) performed:   Procedures  CRITICAL CARE Performed by: Margette Fast Total critical care time: 30 minutes Critical care time was exclusive of separately billable procedures and treating other  patients. Critical care was necessary to treat or prevent imminent or life-threatening deterioration. Critical care was time spent personally by me on the following activities: development of treatment plan with patient and/or surrogate as well as nursing, discussions with consultants, evaluation of patient's response to treatment, examination of patient, obtaining history from patient or surrogate, ordering and performing treatments and interventions, ordering and review of laboratory studies, ordering and review of radiographic studies, pulse oximetry and re-evaluation of patient's condition.  Nanda Quinton, MD Emergency Medicine  ____________________________________________   INITIAL IMPRESSION / ASSESSMENT AND PLAN / ED COURSE  Pertinent labs & imaging results that were available during my care of the patient were reviewed by me and considered in my medical decision making (see chart for details).  Patient resents to the emergency department for evaluation of difficulty breathing, hypoxemia, and near syncope. He is 77% on 4 L on arrival to the emergency department. Found to be hypotensive when moved back to the acute care area. Patient is awake, alert, sitting on the edge of the bed talking to me. Here mildly dyspneic. Appears volume up with JVP to the mandible and LE edema but patient reports improved LE edema overall. He subsequently became hypotensive to the 80s with no significant change in mental status. Gave two separate 500 ml boluses for hypotension. Suspect that the patient is intravascularly depleted from over diuresis at home with new creatinine elevation. Hypoxemia is likely chronic and made worse with loaner O2 machine unable to maintain give patient's typical home O2 requirement.   10:00 PM Patient with improved BP after IVF but I am cautious given CHF history. No other clinical signs/symptoms to suggest acute on chronic heart failure. Will discuss admission with the hospitalist.    Discussed patient's case with hospitalist, Dr. Loleta Books. Patient and family (if present) updated with plan. Care transferred to Hospitalist service.  I reviewed all nursing notes, vitals, pertinent old records, EKGs, labs, imaging (as available).   ____________________________________________  FINAL CLINICAL IMPRESSION(S) / ED DIAGNOSES  Final diagnoses:  Near syncope  Hypoxemia  Hypotension, unspecified hypotension type     MEDICATIONS GIVEN DURING THIS VISIT:  Medications  tiotropium (SPIRIVA) inhalation capsule 18 mcg (18 mcg Inhalation Not Given 09/23/16 0813)  furosemide (LASIX) tablet 80 mg (80 mg Oral Given 09/23/16 0848)  atorvastatin (LIPITOR) tablet 10 mg (not administered)  budesonide (PULMICORT) nebulizer solution 0.25 mg (0.25 mg Inhalation Not Given 09/23/16 0813)  spironolactone (ALDACTONE) tablet 25 mg (not administered)  Pirfenidone CAPS 2 capsule (not administered)  warfarin (COUMADIN) tablet 2.5 mg (not administered)  warfarin (COUMADIN) tablet 5 mg (not administered)  Warfarin - Pharmacist Dosing Inpatient (not  administered)  sodium chloride 0.9 % bolus 500 mL (0 mLs Intravenous Stopped 09/22/16 2131)  sodium chloride 0.9 % bolus 500 mL (0 mLs Intravenous Stopped 09/23/16 0058)     NEW OUTPATIENT MEDICATIONS STARTED DURING THIS VISIT:  None   Note:  This document was prepared using Dragon voice recognition software and may include unintentional dictation errors.  Nanda Quinton, MD Emergency Medicine   Margette Fast, MD 09/23/16 [phone]

## 2016-09-22 NOTE — H&P (Signed)
History and Physical  Patient Name: Patrick Weiss     QPR:[id number]    DOB: [birth year]    DOA: 09/22/2016 PCP: Foye Spurling, MD   Patient coming from: Home  Chief Complaint: Syncope  HPI: Patrick Weiss is a 76 y.o. male with a past medical history significant for emphysema and IPF on [place] and 3L home O2, pHTN, CHF EF 35% and RHF, hx of stroke, Afib on warfarin, and CAD who presents with syncope today.  The patient was in his usual state of health until about a week ago when his portable oxygen device broke, had to be sent back for repairs, and he got a temp/loaner of some sort.  He normally uses 3L at rest with his home concentrator and 5LPM with activity, but the loaner only goes to 3L max.  He hasn't noticed any new dyspnea, chest pain, shortness of breath with exertion, palpitations, new fever, change in chronic sputum, or increased cough this week.  Today, however, he was in [place] with his wife, they were walking to the car when he started to get dizzy and lightheaded, lowered himself the ground, almost passed out, and just rested there until he felt able to stand, got himself up and got in the car.  His wife drove them home, and he walked from the car into the house and had another episode of dizziness, near syncope and lowered himself to the ground.  He called Dr. Thompson Caul office, who recommended he be eavluated.  ED course: -Afebrile, heart rate 106, RR 22, pulse ox 77% on 3L initially, BP 102/77 -Na 141, K 4.6, Cr 2.45 (baseline 1.9-2.2), WBC 4.4K, Hgb 15.4 -BNP 1500 -CXR without edema -Troponin negative -He was given 1L NS and TRH were asked to admit for CHF and needing diuresis     Of note: -The patient has been back down to lasix 80 mg BID lately because of worsening of his renal function, and lately plan has been to accept a higher creatinine of ~2 mg/dL.   -He reports a gradual progressive decrease in his weight from mid 180s in January to mid-170s recently and 174  today. -Also, his PCP recently decreased his valsartan from 160 to 80 mg daily because of low BP.        ROS: Review of Systems  Constitutional: Negative for chills, fever and malaise/fatigue.  Respiratory: Positive for sputum production (chronic, unchanged, not purulent). Negative for cough, hemoptysis, shortness of breath and wheezing.   Cardiovascular: Positive for leg swelling (chronic unchanged). Negative for chest pain, palpitations and PND.  Gastrointestinal: Negative for abdominal pain and vomiting.  Genitourinary: Negative for dysuria, frequency, hematuria and urgency.  Neurological: Positive for dizziness. Negative for tingling, tremors, sensory change, speech change, focal weakness, seizures, loss of consciousness, weakness and headaches.  All other systems reviewed and are negative.         Past Medical History:  Diagnosis Date  . A-fib (HCC)    paroxysmal  . CHF (congestive heart failure) ([place])   . COPD (chronic obstructive pulmonary disease) ([place])   . Hypertension   . Pulmonary fibrosis ([place])   . Shortness of breath dyspnea   . 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 BACK SURGERY  2001  . CARDIAC CATHETERIZATION N/A 04/28/2015   Procedure: Right/Left Heart Cath and Coronary Angiography;  Surgeon:  Belva Crome, MD;  Location: [place] [facility];  Service: Cardiovascular;  Laterality: N/A;  . CARDIAC CATHETERIZATION N/A 03/16/2016   Procedure: Right Heart Cath;  Surgeon: Belva Crome, MD;  Location: [place] [facility];  Service: Cardiovascular;  Laterality: N/A;  . FEMUR FRACTURE SURGERY    . TIBIA FRACTURE SURGERY  1975    Social History: Patient lives with his wife.  The patient walks unassisted.  He is from [place]. He was an Charity fundraiser for [employer].  He is a former smoker, quit many years ago.  He is independent with all ADLs, has no  dementia.    No Known Allergies  Family history: family history includes Cancer in his father.  Prior to Admission medications   Medication Sig Start Date End Date Taking? Authorizing Provider  Aclidinium Bromide 400 MCG/ACT AEPB Inhale 1 puff into the lungs 2 (two) times daily. 09/16/16  Yes Brand Males, MD  allopurinol (ZYLOPRIM) 100 MG tablet Take 100 mg by mouth daily.   Yes Historical Provider, MD  atorvastatin (LIPITOR) 10 MG tablet TAKE 1 TABLET(10 MG) BY MOUTH DAILY 05/12/16  Yes Burtis Junes, NP  Calcium Carbonate-Vitamin D (CALCIUM 600+D) 600-200 MG-UNIT TABS Take 1 tablet by mouth 2 (two) times daily.   Yes Historical Provider, MD  Carboxymethylcellulose Sodium (REFRESH TEARS OP) Place 1 drop into both eyes 3 (three) times daily as needed (for dry eyes).    Yes Historical Provider, MD  docusate sodium (COLACE) 100 MG capsule Take 100 mg by mouth daily as needed for mild constipation.   Yes Historical Provider, MD  fexofenadine (ALLEGRA) 180 MG tablet Take 180 mg by mouth daily.    Yes Historical Provider, MD  fluticasone (FLOVENT HFA) 44 MCG/ACT inhaler Inhale 2 puffs into the lungs 2 (two) times daily. 03/25/16  Yes Brand Males, MD  furosemide (LASIX) 80 MG tablet Take 1 tablet (80 mg total) by mouth 2 (two) times daily. 06/23/16  Yes Eileen Stanford, PA-C  OXYGEN Inhale 3-4 L into the lungs continuous.   Yes Historical Provider, MD  Pirfenidone 267 MG CAPS Take 2 capsules by mouth 3 (three) times daily.    Yes Historical Provider, MD  spironolactone (ALDACTONE) 25 MG tablet Take 1 tablet (25 mg total) by mouth daily. 12/10/15  Yes Belva Crome, MD  valsartan (DIOVAN) 160 MG tablet Take 80 mg by mouth daily.   Yes Historical Provider, MD  warfarin (COUMADIN) 5 MG tablet TAKE AS DIRECTED Patient taking differently: Take 7.5 mg by mouth in the morning on Sun and 5 mg on Mon/Tues/Wed/Thurs/Fri/Sat 08/01/16  Yes Thompson Grayer, MD       Physical Exam: BP 116/86   Pulse  87   Temp 97.5 F (36.4 C) (Oral)   Resp 13   Ht _0  (1.778 m)   Wt 79.8 kg (176 lb)   SpO2 96%   BMI 25.25 kg/m  General appearance: Thin elderly adult male, alert and in no acute distress.   Eyes: Anicteric, conjunctiva pink, lids and lashes normal. PERRL.    ENT: No nasal deformity, discharge, epistaxis.  Hearing normal. OP tacky dry without lesions.   Neck: No neck masses.  Trachea midline.  No thyromegaly/tenderness. Lymph: No cervical or supraclavicular lymphadenopathy. Skin: Warm and dry.  No suspicious rashes or lesions. Cardiac: RRR, nl S1-S2, S4.  Capillary refill is brisk.  JVP elevated, exerternal jug veins distended.  3+ LE edema.  Radial pulses 2+ and symmetric. Respiratory: Normal respiratory  rate and rhythm.  CTAB without rales or wheezes. Abdomen: Abdomen soft.  No TTP. No ascites, distension, hepatosplenomegaly.   MSK: No deformities or effusions.  No cyanosis or clubbing. Neuro: Cranial nerves normal.  Sensation intact to light touch. Speech is fluent.  Muscle strength normal.    Psych: Sensorium intact and responding to questions, attention normal.  Behavior appropriate.  Affect normal.  Judgment and insight appear normal.     Labs on Admission:  I have personally reviewed following labs and imaging studies: CBC:  Recent Labs Lab 09/22/16 2007  WBC 4.4  HGB 15.4  HCT 45.7  MCV 90.1  PLT 952*   Basic Metabolic Panel:  Recent Labs Lab 09/22/16 2007  NA 141  K 4.6  CL 106  CO2 24  GLUCOSE 135*  BUN 53*  CREATININE 2.45*  CALCIUM 9.7   GFR: Estimated Creatinine Clearance: 27.3 mL/min (A) (by C-G formula based on SCr of 2.45 mg/dL (H)).  Liver Function Tests: No results for input(s): AST, ALT, ALKPHOS, BILITOT, PROT, ALBUMIN in the last 168 hours. No results for input(s): LIPASE, AMYLASE in the last 168 hours. No results for input(s): AMMONIA in the last 168 hours. Coagulation Profile: No results for input(s): INR, PROTIME in the last 168  hours. Cardiac Enzymes: No results for input(s): CKTOTAL, CKMB, CKMBINDEX, TROPONINI in the last 168 hours. BNP (last 3 results)  Recent Labs  04/21/16 1246  PROBNP 4,540*   HbA1C: No results for input(s): HGBA1C in the last 72 hours. CBG: No results for input(s): GLUCAP in the last 168 hours. Lipid Profile: No results for input(s): CHOL, HDL, LDLCALC, TRIG, CHOLHDL, LDLDIRECT in the last 72 hours. Thyroid Function Tests: No results for input(s): TSH, T4TOTAL, FREET4, T3FREE, THYROIDAB in the last 72 hours. Anemia Panel: No results for input(s): VITAMINB12, FOLATE, FERRITIN, TIBC, IRON, RETICCTPCT in the last 72 hours. Sepsis Labs: Invalid input(s): PROCALCITONIN, LACTICIDVEN No results found for this or any previous visit (from the past 240 hour(s)).       Radiological Exams on Admission: Personally reviewed CXR shows cardiomegaly, no edema or pneumonia: Dg Chest Port 1 View  Result Date: 09/22/2016 CLINICAL DATA:  76 year old male with shortness of breath. EXAM: PORTABLE CHEST 1 VIEW COMPARISON:  Chest radiograph dated 11/02/2015 FINDINGS: There is moderate cardiomegaly. There is COPD changes with increased interstitial prominence. Bibasilar interstitial crowding and atelectatic changes noted. There is no focal consolidation, pleural effusion, or pneumothorax. No acute osseous pathology. IMPRESSION: 1. No acute cardiopulmonary process. 2. Cardiomegaly. Electronically Signed   By: Anner Crete M.D.   On: 09/22/2016 22:06    EKG: Independently reviewed. Rate 110, QTc 460, no ST changes.    Assessment/Plan  1. Acute on chronic hypoxic respiratory failure:  Reports his baseline is 85-90% at home, suspect with this temp O2 tank he just couldn't keep up with activity and his O2 sat dropped to something much lower like 50 or 60% and that caused his syncope.  No recent dyspnea, sputum production, fever, or cough and CXR normal, so doubt pneumonia or IPF flare.  On warfarin, doubt  PE.  Chronically fluid overloaded, but actually weight lowest it has been for a while, plus CXR clear, doubt hypoxia is from CHF. -Spot pulse ox checks, keep 88-92% roughly -Check ABG    2. 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  Last EF two months ago 35%.  Has impressive LE edema, >>BNP, and distended ext neck veins, but weight  overall down. -Will consult Cardiology re: adjust ARB? And adjust diuretics? -Has missed night dose of diuretic and gotten fluids for hypotension, so for now no more fluids and will restart Lasix/spironolactone in AM -No BB given lung disease -Hold ARB tonight -Continue statin  3. Hypotension:  Given his progressive weight decline, suspect this is really just from diuresis and his ARB.   -Hold ARB today -Hold diuretics til tomorrow  4. COPD/emphysema:  No wheezing, no change in respiratory symptoms. Do not feel this is active. -Continue Tudorza as formulary alternative -Continue Flovent  5. IPF:  -Continue pirfenidone  6. Paroxysmal atrial fibrillation:  CHADS2vasc 5.   -Continue warfarin  7. Other medications:  -Continue allopurinol     DVT prophylaxis: N/A on warfarin  Code Status: DO NOT RESUSCITATE  Family Communication: None present   Disposition Plan: Anticipate observe O2 overnight, monitor BP.  In AM, eval by Cards re: ARB and diuretics, then likely home. Consults called: Cardiology via Inbasket overnight Admission status: OBS At the point of initial evaluation, it is my clinical opinion that admission for OBSERVATION is reasonable and necessary because the patient's presenting complaints in the context of their chronic conditions represent sufficient risk of deterioration or significant morbidity to constitute reasonable grounds for close observation in the hospital setting, but that the patient may be medically stable for discharge from the hospital within 24 to 48 hours.    Medical decision  making: Patient seen at 10:45 PM on 09/22/2016.  The patient was discussed with Dr. Laverta Baltimore.  What exists of the patient's chart was reviewed in depth and summarized above.  Clinical condition: stable BP after fluids, HR stable, mentating well, appears comfortable expect that he is appropriate for tele bed.        Edwin Dada [facility] Pager [phone]

## 2016-09-22 NOTE — ED Triage Notes (Signed)
Pt comes from home after 2 near syncopal episodes. Pt was walking through parking lot and became dizzy so he sat down. THe same happened again at home. PT is on 3LPM  at home and 5LPM with activity. Pt reports his concentrator is in the shop so he is using a spare that only goes to 3LPM. In triage p o2 76% on 4 LPM. Placed on NRB at 15lpm and increased to 100%. NAD.

## 2016-09-22 NOTE — ED Notes (Signed)
Pt spo2 77% with good pleth on 3lpm [place]. Pt was changed from home concentrator to wall o2 with no change in saturation. Pt placed on 15lpm NRB and o2 increased to 100%. Pt back on 4LPM [place] at this time with o2 95%.  When o2 sat was 77% he was still speakingin complete sentences with mild dyspnea at rest

## 2016-09-23 DIAGNOSIS — N184 Chronic kidney disease, stage 4 (severe): Secondary | ICD-10-CM

## 2016-09-23 DIAGNOSIS — J9621 Acute and chronic respiratory failure with hypoxia: Secondary | ICD-10-CM | POA: Diagnosis not present

## 2016-09-23 DIAGNOSIS — Z7951 Long term (current) use of inhaled steroids: Secondary | ICD-10-CM | POA: Diagnosis not present

## 2016-09-23 DIAGNOSIS — R0602 Shortness of breath: Secondary | ICD-10-CM | POA: Diagnosis not present

## 2016-09-23 DIAGNOSIS — I2781 Cor pulmonale (chronic): Secondary | ICD-10-CM | POA: Diagnosis not present

## 2016-09-23 DIAGNOSIS — I2510 Atherosclerotic heart disease of native coronary artery without angina pectoris: Secondary | ICD-10-CM | POA: Diagnosis present

## 2016-09-23 DIAGNOSIS — I130 Hypertensive heart and chronic kidney disease with heart failure and stage 1 through stage 4 chronic kidney disease, or unspecified chronic kidney disease: Secondary | ICD-10-CM | POA: Diagnosis present

## 2016-09-23 DIAGNOSIS — I2729 Other secondary pulmonary hypertension: Secondary | ICD-10-CM | POA: Diagnosis present

## 2016-09-23 DIAGNOSIS — Z7901 Long term (current) use of anticoagulants: Secondary | ICD-10-CM | POA: Diagnosis not present

## 2016-09-23 DIAGNOSIS — Z9981 Dependence on supplemental oxygen: Secondary | ICD-10-CM | POA: Diagnosis not present

## 2016-09-23 DIAGNOSIS — I5082 Biventricular heart failure: Secondary | ICD-10-CM | POA: Diagnosis present

## 2016-09-23 DIAGNOSIS — I429 Cardiomyopathy, unspecified: Secondary | ICD-10-CM | POA: Diagnosis present

## 2016-09-23 DIAGNOSIS — Z79899 Other long term (current) drug therapy: Secondary | ICD-10-CM | POA: Diagnosis not present

## 2016-09-23 DIAGNOSIS — I959 Hypotension, unspecified: Secondary | ICD-10-CM | POA: Diagnosis present

## 2016-09-23 DIAGNOSIS — Z87891 Personal history of nicotine dependence: Secondary | ICD-10-CM | POA: Diagnosis not present

## 2016-09-23 DIAGNOSIS — Z9181 History of falling: Secondary | ICD-10-CM | POA: Diagnosis not present

## 2016-09-23 DIAGNOSIS — R55 Syncope and collapse: Secondary | ICD-10-CM

## 2016-09-23 DIAGNOSIS — I5043 Acute on chronic combined systolic (congestive) and diastolic (congestive) heart failure: Secondary | ICD-10-CM | POA: Diagnosis present

## 2016-09-23 DIAGNOSIS — I5032 Chronic diastolic (congestive) heart failure: Secondary | ICD-10-CM | POA: Diagnosis not present

## 2016-09-23 DIAGNOSIS — Z8673 Personal history of transient ischemic attack (TIA), and cerebral infarction without residual deficits: Secondary | ICD-10-CM | POA: Diagnosis not present

## 2016-09-23 DIAGNOSIS — I2723 Pulmonary hypertension due to lung diseases and hypoxia: Secondary | ICD-10-CM | POA: Diagnosis not present

## 2016-09-23 DIAGNOSIS — J849 Interstitial pulmonary disease, unspecified: Secondary | ICD-10-CM | POA: Diagnosis not present

## 2016-09-23 DIAGNOSIS — I480 Paroxysmal atrial fibrillation: Secondary | ICD-10-CM | POA: Diagnosis not present

## 2016-09-23 DIAGNOSIS — J439 Emphysema, unspecified: Secondary | ICD-10-CM | POA: Diagnosis present

## 2016-09-23 DIAGNOSIS — Z66 Do not resuscitate: Secondary | ICD-10-CM | POA: Diagnosis present

## 2016-09-23 DIAGNOSIS — J84112 Idiopathic pulmonary fibrosis: Secondary | ICD-10-CM | POA: Diagnosis present

## 2016-09-23 LAB — PROTIME-INR
INR: 2.74
PROTHROMBIN TIME: 29.6 s — AB (ref 11.4–15.2)

## 2016-09-23 LAB — I-STAT ARTERIAL BLOOD GAS, ED
Acid-base deficit: 1 mmol/L (ref 0.0–2.0)
Bicarbonate: 23.3 mmol/L (ref 20.0–28.0)
O2 SAT: 92 %
PCO2 ART: 37.5 mmHg (ref 32.0–48.0)
PH ART: 7.398 (ref 7.350–7.450)
PO2 ART: 61 mmHg — AB (ref 83.0–108.0)
Patient temperature: 97.5
TCO2: 24 mmol/L (ref 0–100)

## 2016-09-23 LAB — URINALYSIS, ROUTINE W REFLEX MICROSCOPIC
BILIRUBIN URINE: NEGATIVE
Glucose, UA: NEGATIVE mg/dL
Hgb urine dipstick: NEGATIVE
KETONES UR: NEGATIVE mg/dL
LEUKOCYTES UA: NEGATIVE
NITRITE: NEGATIVE
PH: 5 (ref 5.0–8.0)
PROTEIN: NEGATIVE mg/dL
Specific Gravity, Urine: 1.015 (ref 1.005–1.030)

## 2016-09-23 LAB — BASIC METABOLIC PANEL
ANION GAP: 9 (ref 5–15)
BUN: 52 mg/dL — AB (ref 6–20)
CHLORIDE: 106 mmol/L (ref 101–111)
CO2: 28 mmol/L (ref 22–32)
Calcium: 9.2 mg/dL (ref 8.9–10.3)
Creatinine, Ser: 2.13 mg/dL — ABNORMAL HIGH (ref 0.61–1.24)
GFR calc Af Amer: 33 mL/min — ABNORMAL LOW (ref >60)
GFR, EST NON AFRICAN AMERICAN: 29 mL/min — AB (ref >60)
GLUCOSE: 81 mg/dL (ref 65–99)
POTASSIUM: 4.6 mmol/L (ref 3.5–5.1)
Sodium: 143 mmol/L (ref 135–145)

## 2016-09-23 MED ORDER — ATORVASTATIN CALCIUM 10 MG PO TABS
10.0000 mg | ORAL_TABLET | Freq: Every day | ORAL | Status: DC
  Administered 2016-09-23 – 2016-09-24 (×2): 10 mg via ORAL
  Filled 2016-09-23 (×2): qty 1

## 2016-09-23 MED ORDER — TIOTROPIUM BROMIDE MONOHYDRATE 18 MCG IN CAPS
18.0000 ug | ORAL_CAPSULE | Freq: Every day | RESPIRATORY_TRACT | Status: DC
  Administered 2016-09-24 – 2016-09-25 (×2): 18 ug via RESPIRATORY_TRACT
  Filled 2016-09-23: qty 5

## 2016-09-23 MED ORDER — WARFARIN SODIUM 5 MG PO TABS
5.0000 mg | ORAL_TABLET | ORAL | Status: DC
  Administered 2016-09-23 – 2016-09-24 (×2): 5 mg via ORAL
  Filled 2016-09-23 (×2): qty 1

## 2016-09-23 MED ORDER — FUROSEMIDE 10 MG/ML IJ SOLN
80.0000 mg | Freq: Two times a day (BID) | INTRAMUSCULAR | Status: DC
  Administered 2016-09-23: 80 mg via INTRAVENOUS
  Filled 2016-09-23: qty 8

## 2016-09-23 MED ORDER — SPIRONOLACTONE 25 MG PO TABS
25.0000 mg | ORAL_TABLET | Freq: Every day | ORAL | Status: DC
  Administered 2016-09-23 – 2016-09-25 (×3): 25 mg via ORAL
  Filled 2016-09-23 (×3): qty 1

## 2016-09-23 MED ORDER — WARFARIN - PHARMACIST DOSING INPATIENT
Freq: Every day | Status: DC
  Administered 2016-09-23 – 2016-09-24 (×2): 5

## 2016-09-23 MED ORDER — WARFARIN SODIUM 2.5 MG PO TABS: 2.5000 mg | ORAL_TABLET | ORAL | Status: DC

## 2016-09-23 MED ORDER — PIRFENIDONE 267 MG PO CAPS
2.0000 | ORAL_CAPSULE | Freq: Three times a day (TID) | ORAL | Status: DC
  Administered 2016-09-23 (×2): 2 via ORAL
  Filled 2016-09-23 (×2): qty 30
  Filled 2016-09-23: qty 1

## 2016-09-23 MED ORDER — FUROSEMIDE 80 MG PO TABS
80.0000 mg | ORAL_TABLET | Freq: Two times a day (BID) | ORAL | Status: DC
  Administered 2016-09-23: 80 mg via ORAL
  Filled 2016-09-23: qty 1

## 2016-09-23 MED ORDER — BUDESONIDE 0.25 MG/2ML IN SUSP
0.2500 mg | Freq: Two times a day (BID) | RESPIRATORY_TRACT | Status: DC
  Administered 2016-09-23 – 2016-09-25 (×4): 0.25 mg via RESPIRATORY_TRACT
  Filled 2016-09-23 (×5): qty 2

## 2016-09-23 NOTE — Progress Notes (Signed)
Patient had a quiet day without any complaints.  No dizziness voiced this shift.  Orthostatic Bps unremarkable.  Marcille Blanco, RN

## 2016-09-23 NOTE — Progress Notes (Signed)
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 Patient apparently had 2 syncopal episodes, was found to be hypoxic on presentation. Also noted to be hypotensive and received fluid boluses.  Assessment and plan 1. Acute on chronic hypoxic respiratory failure:  Reports his baseline is 85-90% at home, suspect with this temp O2 tank he just couldn't keep up with activity and his O2 sat dropped to something much lower like 50 or 60% and that caused his syncope.  No recent dyspnea, sputum production, fever, or cough and CXR normal, so doubt pneumonia or IPF flare.  On warfarin, doubt PE.  Chronically fluid overloaded, but actually weight lowest it has been for a while, plus CXR clear, doubt hypoxia is from CHF. -Spot pulse ox checks, keep 88-92% roughly  ABG showed a pH of 7.39, PCO2 37, PO2 61. chronic hypoxemic respiratory failure due to combination of emphysema and idiopathic pulmonary fibrosis [diagnoses given 03/11/2015). This is associated with cor pulmonale and also chronic systolic heart failure ejection fraction 45% in February 2016. He is on anti-fibrotic therapy Esbriet since approximately November 2016. Also have pulmonary see the patient    2. Chronic  systolic and diastolic and right heart failure:  Challenging fluid balance.  Last EF two months ago 35% to 40%.  Has impressive LE edema, >>BNP, and distended ext neck veins, but weight overall down. Cardiology consult for congestive heart failure -Has missed night dose of diuretic and gotten fluids for hypotension, so for now no more fluids and will restart Lasix/spironolactone  -No BB given lung disease -Hold ARB tonight -Continue statin Cardiology consult for medical optimization of heart failure medications  3. Hypotension:  Given his progressive weight decline, suspect this is really just from diuresis and his ARB.   -Hold ARB today    4. COPD/emphysema:  No wheezing, no change in respiratory symptoms. Do not feel this is active. -Continue Tudorza as formulary alternative -Continue Flovent  5. IPF:  -Continue pirfenidone  6. Paroxysmal atrial fibrillation:  CHADS2vasc 5.   -Continue warfarin  7. Other medications:  -Continue allopurinol      DVT prophylaxsis Coumadin  Code Status:  DO NOT RESUSCITATE    Family Communication: Discussed in detail with the patient, all imaging results, lab results explained to the patient   Disposition Plan:  Cardiology consult      Consultants:  Cardiology  pulmonary  Procedures:  None  Antibiotics: Anti-infectives    None         HPI/Subjective: Still had DOE , no cp  Objective: Vitals:   09/23/16 0000 09/23/16 0133 09/23/16 0144 09/23/16 0629  BP: 101/85 120/85  113/80  Pulse: 85 87  73  Resp: 14  16  16  Temp:    97.8 F (36.6 C)  TempSrc:  Oral  Oral  SpO2: 100% 94% 94% 96%  Weight:  84.6 kg (186 lb 8.2 oz)    Height:  _0  (1.778 m)      Intake/Output Summary (Last 24 hours) at 09/23/16 0815 Last data filed at 09/23/16 0400  Gross per 24 hour  Intake                0 ml  Output              275 ml  Net             -275 ml    Exam:  Examination:  General exam: Appears calm  and comfortable  Respiratory system: Clear to auscultation. Respiratory effort normal. Cardiovascular system: S1 & S2 heard, RRR. No JVD, murmurs, rubs, gallops or clicks. No pedal edema. Gastrointestinal system: Abdomen is nondistended, soft and nontender. No organomegaly or masses felt. Normal bowel sounds heard. Central nervous system: Alert and oriented. No focal neurological deficits. Extremities: Symmetric 5 x 5 power. Skin: No rashes, lesions or ulcers Psychiatry: Judgement and insight appear normal. Mood & affect appropriate.     Data Reviewed: I have personally reviewed following labs and imaging studies  Micro Results No results found for this or any previous visit (from the past 240 hour(s)).  Radiology Reports Mr [name] QG Contrast  Result Date: 09/09/2016 [facility] NEUROIMAGING REPORT STUDY DATE: 09/06/16 PATIENT NAME: Patrick Weiss DOB: 23-Nov-1940 MRN: [id number] ORDERING CLINICIAN: Antony Contras, MD CLINICAL HISTORY: 76 year old male with left sided vision loss. EXAM: MRA head (without) TECHNIQUE: MR angiogram of the head was obtained utilizing 3D time of flight sequences from below the vertebrobasilar junction up to the intracranial vasculature without contrast.  Computerized reconstructions were obtained. CONTRAST: no IMAGING SITE: [facility] [address] (1.5 Tesla MRI)  FINDINGS: This study is of adequate technical quality. No signal seen with the right internal carotid artery proximally. There is distal reconstitution of the right internal carotid artery via the right posterior communicating artery. The right internal carotid artery then bifurcates into the right middle and anterior cerebral artery. There is relatively decreased flow signal within the right middle cerebral artery compared to the left side. Right A1 segment has decreased flow signal. Bilateral A2 segments appear normal. The left intracerebral artery appears to arise from the  anterior communicating artery. There is probable artifactual signal loss in the proximal A2 segments. There is normal appearing flow signal distally in the bilateral anterior cerebral arteries. Flow signal of the left internal carotid arteries have no stenosis. The left middle cerebral artery has no stenosis. The left vertebral artery is dominant and supplies the basilar artery. No significant stenosis. Bilateral posterior cerebral arteries arise from the basilar artery. Bilateral P1 segments appear normal. More distally the bilateral posterior cerebral arteries have decreased flow signal and irregular appearance consistent with diffuse intracranial atherosclerosis. No aneurysmal dilatations are seen.   Abnormal MRA head (without) demonstrating: 1. 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 2. Relatively decreased flow signal in the right middle cerebral artery compared to the left side. 3. Decreased flow signal in the proximal bilateral anterior cerebral arteries which may be artifactual. 4. Chronic occlusion of right vertebral artery. 5. Bilateral posterior cerebral artery intracranial atherosclerosis. INTERPRETING PHYSICIAN: Penni Bombard, MD Certified in Neurology, Neurophysiology and Neuroimaging [facility] [address], [place], [place] [address] [phone]   Mr Jodene Nam Neck Wo Contrast  Result Date: 09/09/2016 [facility] NEUROIMAGING REPORT STUDY DATE: 09/06/16 PATIENT NAME: DEMARQUES PILZ DOB: Aug 02, 1940 MRN: [id number] ORDERING CLINICIAN: Antony Contras, MD CLINICAL HISTORY: 76 year old male with left eye vision loss. EXAM: MRA neck (without) TECHNIQUE: MR angiogram of the neck was obtained utilizing 2D time-of-flight noncontrast views and computerized reconstructions were obtained. CONTRAST: no IMAGING SITE: [facility] [address] (1.5 Tesla MRI)  FINDINGS: This  study is of adequate technical quality.  There is anterograde flow in the bilateral vertebral and left carotid arteries on 2D-TOF views.  The left common, internal and external carotid arteries have no stenosis.  The left vertebral artery has no stenosis from its origin up to the vertebrobasilar junction.  The right common and external carotid arteries have no stenosis. The right internal carotid artery is not visualized. The right vertebral artery is hypoplastic. Limited views of the intracranial vasculature are unremarkable.   Abnormal MRA neck (without) demonstrating: 1. Chronic right internal carotid artery occlusion. 2. Hypoplastic right vertebral artery. INTERPRETING PHYSICIAN: Penni Bombard, MD Certified in Neurology, Neurophysiology and Neuroimaging [facility] [address], [place], [place] [address] [phone]   Mr Brain Wo Contrast  Result Date: 09/09/2016 [facility] NEUROIMAGING REPORT STUDY DATE: 09/06/16 PATIENT NAME: DAKWAN PRIDGEN DOB: 11-03-1940 MRN: [id number] ORDERING CLINICIAN: Antony Contras, MD CLINICAL HISTORY: 76 year old male with left eye vision loss. History of stroke. EXAM: MRI brain (without) TECHNIQUE: MRI of the brain without contrast was obtained utilizing 5 mm axial slices with T1, T2, T2 flair, SWI and diffusion weighted views.  T1 sagittal and T2 coronal views were obtained. CONTRAST: no IMAGING SITE: [facility] [address] (1.5 Tesla MRI)  FINDINGS: No abnormal lesions are seen on diffusion-weighted views to suggest acute ischemia. The cortical sulci, fissures and cisterns are notable for mild diffuse atrophy. Lateral, third and fourth ventricle are normal in size and appearance. No extra-axial fluid collections are seen. No evidence of mass effect or midline shift.  Moderate periventricular and subcortical chronic small vessel ischemic disease. Right anterior frontal encephalomalacia and gliosis related to  chronic ischemic infarction. On sagittal views the posterior fossa, pituitary gland and corpus callosum are unremarkable. Multiple left parietal and left temporal chronic cerebral microhemorrhages on SWI views, raising possibility of underlying amyloid angiopathy. The orbits and their contents, paranasal sinuses and calvarium are unremarkable.  Intracranial flow voids are present., except for abnormal T2 signal in the right internal carotid artery, likely related to slow flow or occlusion.   Abnormal MRI brain (without) demonstrating: 1. Chronic right anterior frontal ischemic infarction. 2. Moderate periventricular and subcortical chronic small vessel ischemic disease. 3. Multiple left parietal and left temporal chronic cerebral microhemorrhages on SWI views, raising possibility of underlying amyloid angiopathy. 4. Chronic right internal carotid artery occlusion. 5. No acute findings. 6. Compared to MRI on 09/23/05, there has been mild increase in chronic small vessel ischemic disease and atrophy. INTERPRETING PHYSICIAN: Penni Bombard, MD Certified in Neurology, Neurophysiology and Neuroimaging [facility] [address], [place], [place] [address] [phone]   Dg Chest [place] 1 View  Result Date: 09/22/2016 CLINICAL DATA:  76 year old male with shortness of breath. EXAM: PORTABLE CHEST 1 VIEW COMPARISON:  Chest radiograph dated 11/02/2015 FINDINGS: There is moderate cardiomegaly. There is COPD changes with increased interstitial prominence. Bibasilar  interstitial crowding and atelectatic changes noted. There is no focal consolidation, pleural effusion, or pneumothorax. No acute osseous pathology. IMPRESSION: 1. No acute cardiopulmonary process. 2. Cardiomegaly. Electronically Signed   By: Anner Crete M.D.   On: 09/22/2016 22:06     CBC  Recent Labs Lab 09/22/16 2007  WBC 4.4  HGB 15.4  HCT 45.7  PLT 125*  MCV 90.1  MCH 30.4  MCHC 33.7  RDW 20.2*     Chemistries   Recent Labs Lab 09/22/16 2007 09/23/16 0603  NA 141 143  K 4.6 4.6  CL 106 106  CO2 24 28  GLUCOSE 135* 81  BUN 53* 52*  CREATININE 2.45* 2.13*  CALCIUM 9.7 9.2   ------------------------------------------------------------------------------------------------------------------ estimated creatinine clearance is 31.4 mL/min (A) (by C-G formula based on SCr of 2.13 mg/dL (H)). ------------------------------------------------------------------------------------------------------------------ No results for input(s): HGBA1C in the last 72 hours. ------------------------------------------------------------------------------------------------------------------ No results for input(s): CHOL, HDL, LDLCALC, TRIG, CHOLHDL, LDLDIRECT in the last 72 hours. ------------------------------------------------------------------------------------------------------------------ No results for input(s): TSH, T4TOTAL, T3FREE, THYROIDAB in the last 72 hours.  Invalid input(s): FREET3 ------------------------------------------------------------------------------------------------------------------ No results for input(s): VITAMINB12, FOLATE, FERRITIN, TIBC, IRON, RETICCTPCT in the last 72 hours.  Coagulation profile  Recent Labs Lab 09/22/16 2007  INR 2.74    No results for input(s): DDIMER in the last 72 hours.  Cardiac Enzymes No results for input(s): CKMB, TROPONINI, MYOGLOBIN in the last 168 hours.  Invalid input(s): CK ------------------------------------------------------------------------------------------------------------------ Invalid input(s): POCBNP   CBG: No results for input(s): GLUCAP in the last 168 hours.     Studies: Dg Chest [place] 1 View  Result Date: 09/22/2016 CLINICAL DATA:  76 year old male with shortness of breath. EXAM: PORTABLE CHEST 1 VIEW COMPARISON:  Chest radiograph dated 11/02/2015 FINDINGS: There is moderate cardiomegaly. There is COPD  changes with increased interstitial prominence. Bibasilar interstitial crowding and atelectatic changes noted. There is no focal consolidation, pleural effusion, or pneumothorax. No acute osseous pathology. IMPRESSION: 1. No acute cardiopulmonary process. 2. Cardiomegaly. Electronically Signed   By: Anner Crete M.D.   On: 09/22/2016 22:06      No results found for: HGBA1C Lab Results  Component Value Date   LDLCALC 61 06/15/2015   CREATININE 2.13 (H) 09/23/2016       Scheduled Meds: . atorvastatin  10 mg Oral q1800  . budesonide  0.25 mg Inhalation BID  . furosemide  80 mg Oral BID  . Pirfenidone  2 capsule Oral TID  . spironolactone  25 mg Oral Daily  . tiotropium  18 mcg Inhalation Daily  . [START ON 09/25/2016] warfarin  2.5 mg Oral Once per day on Sun  . warfarin  5 mg Oral Once per day on Mon Tue Wed Thu Fri Sat  . Warfarin - Pharmacist Dosing Inpatient   Does not apply q1800   Continuous Infusions:   LOS: 0 days    Time spent: >30 MINS    [facility]  [facility] Pager [phone]. If 7PM-7AM, please contact night-coverage at [email or web address], password [facility] 09/23/2016, 8:15 AM  LOS: 0 days

## 2016-09-23 NOTE — Progress Notes (Signed)
ANTICOAGULATION CONSULT NOTE - Initial Consult  Pharmacy Consult for Warfarin  Indication: atrial fibrillation  No Known Allergies  Patient Measurements: Height: 5' 10" (177.8 cm) Weight: 186 lb 8.2 oz (84.6 kg) (bedscale due to syncope) IBW/kg (Calculated) : 73  Vital Signs: Temp: 97.5 F (36.4 C) (03/15 2014) Temp Source: Oral (03/16 0133) BP: 120/85 (03/16 0133) Pulse Rate: 87 (03/16 0133)  Labs:  Recent Labs  09/22/16 2007  HGB 15.4  HCT 45.7  PLT 125*  CREATININE 2.45*    Estimated Creatinine Clearance: 27.3 mL/min (A) (by C-G formula based on SCr of 2.45 mg/dL (H)).   Medical History: Past Medical History:  Diagnosis Date  . A-fib (HCC)    paroxysmal  . CHF (congestive heart failure) ([place])   . COPD (chronic obstructive pulmonary disease) ([place])   . Hypertension   . Pulmonary fibrosis ([place])   . Shortness of breath dyspnea   . Stroke [facility])    March of 2007 and was found to have total occlusion of the right internal carotid artery   Assessment: 76 y/o M from home with syncope, warfarin PTA for afib, INR on admit is 2.74,  Hgb 15.4, noted renal dysfunction.   PTA warfarin dosing: 2.5 mg Sun, 5 mg all other days  Goal of Therapy:  INR 2-3 Monitor platelets by anticoagulation protocol: Yes   Plan:  -Cont warfarin home dosing -Daily PT/INR -Monitor for bleeding  Narda Bonds 09/23/2016,1:46 AM

## 2016-09-23 NOTE — Care Management Note (Signed)
Case Management Note  Patient Details  Name: Patrick Weiss MRN: [id number] Date of Birth: [birth year] Action/Plan: Patient has home oxygen with [employer] since 2016; TCT [employer], talked to [facility], patient dropped his portable oxygen concentrator which was 5 L and he was given  a loaner for 3L; [facility] stated that they would bring his 5L oxygen concentrator to the hospital tomorrow.  Patient's PCP has established Palliative Care services for the patient and the pt and spouse is to meet with them on Thursday.  Expected Discharge Date:  09/25/16               Expected Discharge Plan:  [place]  Discharge planning Services  CM Consult    Status of Service:  In process, will continue to follow  Sherrilyn Rist [phone] 09/23/2016, 3:30 PM

## 2016-09-23 NOTE — ED Notes (Signed)
Per Dr. Loleta Books, pt to be admitted to tele, not stepdown.

## 2016-09-23 NOTE — Consult Note (Signed)
Cardiology Consult    Patient ID: HASSAAN CRITE MRN: [id number], DOB/AGE: [birth year]   Admit date: 09/22/2016 Date of Consult: 09/23/2016  Primary Physician: Foye Spurling, MD Reason for Consult: CHF Primary Cardiologist: Dr. Tamala Julian Requesting Provider: Dr. Allyson Sabal  History of Present Illness    Patrick Weiss is a 76 y.o. 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   Yesterday the patient was in [place] with his wife when walking to the car he developed dizziness and his leg buckled and went down on his knees. He did not lose consciousness. The dizziness resolved shortly after he became seated. He had another dizzy spell within an hour and decided to come to the ED. He had no chest pain, new dypnea or palpitations preceding or with the dizziness. He has had occasional such episodes of dizziness in the past few months although not as severe. He had a fall in January after becoming dizzy when he stood up from bending over.   He has advanced lung disease and uses continuous oxygen. He usually uses 3L with increase to 5L with activity. Recently his portable concentrator was sent for repairs and he has a loaner that does not go over 3L. He did not have his pulse ox with him during the dizzy episode to see if he had low sat.   He has been working on decreasing his fluid overload with diuretics per Dr. Tamala Julian. He takes Lasix 80 mg BID and spironolactone 25 mg daily. His weight has been gradually decreasing from the mid 180's to the mid 170's and his chronic edema has improved. His renal function has worsened over the last 6 months with baseline SCr around 2 and it was decided that this is acceptable for him.   L/RHC in 04/2015 showed chronic stable CAD with mod-severe 3V CAD (65%  occl RCA, 70% occl RPLB, 50% occl oLCx, 66% occl dLCx, 45% occl LAD, 85% occl D1) as well as severe pulmonary HTN. Medical therapy was recommended. He is followed by Dr. Chase Caller for pulmonology.  Right heart cath done in 03/2016 showed severe pulm HTN with pulmonary systolic pressure of 70, normal pulm cap wedge pressure and CO 3.06L/m.  -Na 141, K 4.6, Cr 2.45 (baseline 1.9-2.2), WBC 4.4K, Hgb 15.4 -BNP 1500 -CXR without edema -Troponin negative  Past Medical History   Past Medical History:  Diagnosis Date  . A-fib (HCC)    paroxysmal  . CHF (congestive heart failure) ([place])   . COPD (chronic obstructive pulmonary disease) ([place])   . Hypertension   . Pulmonary fibrosis ([place])   . Shortness of breath dyspnea   . 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 BACK SURGERY  2001  . CARDIAC CATHETERIZATION N/A 04/28/2015   Procedure: Right/Left Heart Cath and Coronary Angiography;  Surgeon: Belva Crome, MD;  Location: [place] [facility];  Service: Cardiovascular;  Laterality: N/A;  . CARDIAC CATHETERIZATION N/A 03/16/2016   Procedure: Right Heart Cath;  Surgeon: Belva Crome, MD;  Location: [place] [facility];  Service: Cardiovascular;  Laterality: N/A;  . FEMUR FRACTURE SURGERY    .  TIBIA FRACTURE SURGERY  1975     Allergies  No Known Allergies  Inpatient Medications    . atorvastatin  10 mg Oral q1800  . budesonide  0.25 mg Inhalation BID  . furosemide  80 mg Oral BID  . Pirfenidone  2 capsule Oral TID  . spironolactone  25 mg Oral Daily  . tiotropium  18 mcg Inhalation Daily  . [START ON 09/25/2016] warfarin  2.5 mg Oral Once per day on Sun  . warfarin  5 mg Oral Once per day on Mon Tue Wed Thu Fri Sat  . Warfarin - Pharmacist Dosing Inpatient   Does not apply q1800    Family History    Family History  Problem Relation Age of Onset  . Cancer Father      Social History    Social History   Social History  . Marital status: Married    Spouse name: N/A  . Number of children: N/A  . Years of education: N/A   Occupational History  . retired    Social History Main Topics  . Smoking status: Former Smoker    Packs/day: 1.00    Years: 30.00    Types: Cigarettes    Quit date: 09/09/1991  . Smokeless tobacco: Never Used  . Alcohol use 0.0 oz/week     Comment: glass of wine with meals  . Drug use: No  . Sexual activity: Yes    Partners: Female   Other Topics Concern  . Not on file   Social History Narrative  . No narrative on file     Review of Systems   General:  No chills, fever, night sweats or weight changes.  Cardiovascular:  No chest pain, edema, orthopnea, palpitations, paroxysmal nocturnal dyspnea. Has baseline DOE, and chronic lower leg edema Dermatological: No rash, lesions/masses Respiratory: No cough, baseline DOE Urologic: No hematuria, dysuria Abdominal:   No nausea, vomiting, diarrhea, bright red blood per rectum, melena, or hematemesis Neurologic:  No wkns, changes in mental status. Has left eye visual change from fall in 07/2016, Intermittent dizziness over the last several months All other systems reviewed and are otherwise negative except as noted above.  Physical Exam   Blood pressure 122/84, pulse 75, temperature 98.1 F (36.7 C), temperature source Oral, resp. rate 16, height _0  (1.778 m), weight 186 lb 8.2 oz (84.6 kg), SpO2 96 %.  General: Pleasant, NAD Psych: Normal affect. Neuro: Alert and oriented X 3. Moves all extremities spontaneously. HEENT: Normal except wearing eye patch on left eye  Neck: Supple without bruits, JVD present Lungs:  Resp regular and unlabored, Fibrous crackles in lower lobes Heart: RRR no s3, s4, or murmurs. Abdomen: Soft, non-tender, non-distended, BS + x 4.  Extremities: No clubbing, or cyanosis. 1+ lower leg edema. DP/PT/Radials 2+ and equal bilaterally.  Labs     Troponin [facility] Test)  Recent Labs  09/22/16 2022  TROPIPOC 0.02   No results for input(s): CKTOTAL, CKMB, TROPONINI in the last 72 hours. Lab Results  Component Value Date   WBC 4.4 09/22/2016   HGB 15.4 09/22/2016   HCT 45.7 09/22/2016   MCV 90.1 09/22/2016   PLT 125 (L) 09/22/2016    Recent Labs Lab 09/23/16 0603  NA 143  K 4.6  CL 106  CO2 28  BUN 52*  CREATININE 2.13*  CALCIUM 9.2  GLUCOSE 81   Lab Results  Component Value Date   CHOL 114 (L) 06/15/2015   HDL 41 06/15/2015  Mazie 61 06/15/2015   TRIG 61 06/15/2015   No results found for: [facility]   Radiology Studies    Mr [name] GY Contrast  Result Date: 09/09/2016 [facility] NEUROIMAGING REPORT STUDY DATE: 09/06/16 PATIENT NAME: CEBASTIAN NEIS DOB: 20-Mar-1941 MRN: [id number] ORDERING CLINICIAN: Antony Contras, MD CLINICAL HISTORY: 76 year old male with left sided vision loss. EXAM: MRA head (without) TECHNIQUE: MR angiogram of the head was obtained utilizing 3D time of flight sequences from below the vertebrobasilar junction up to the intracranial vasculature without contrast.  Computerized reconstructions were obtained. CONTRAST: no IMAGING SITE: [facility] [address] (1.5 Tesla MRI)  FINDINGS: This study is of adequate technical quality. No signal seen with the right internal carotid artery proximally. There is distal reconstitution of the right internal carotid artery via the right posterior communicating artery. The right internal carotid artery then bifurcates into the right middle and anterior cerebral artery. There is relatively decreased flow signal within the right middle cerebral artery compared to the left side. Right A1 segment has decreased flow signal. Bilateral A2 segments appear normal. The left intracerebral artery appears to arise from the anterior communicating artery. There is probable artifactual signal loss in the proximal A2 segments. There is normal  appearing flow signal distally in the bilateral anterior cerebral arteries. Flow signal of the left internal carotid arteries have no stenosis. The left middle cerebral artery has no stenosis. The left vertebral artery is dominant and supplies the basilar artery. No significant stenosis. Bilateral posterior cerebral arteries arise from the basilar artery. Bilateral P1 segments appear normal. More distally the bilateral posterior cerebral arteries have decreased flow signal and irregular appearance consistent with diffuse intracranial atherosclerosis. No aneurysmal dilatations are seen.   Abnormal MRA head (without) demonstrating: 1. 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 2. Relatively decreased flow signal in the right middle cerebral artery compared to the left side. 3. Decreased flow signal in the proximal bilateral anterior cerebral arteries which may be artifactual. 4. Chronic occlusion of right vertebral artery. 5. Bilateral posterior cerebral artery intracranial atherosclerosis. INTERPRETING PHYSICIAN: Penni Bombard, MD Certified in Neurology, Neurophysiology and Neuroimaging [facility] [address], [place], [place] [address] [phone]   Mr Jodene Nam Neck Wo Contrast  Result Date: 09/09/2016 [facility] NEUROIMAGING REPORT STUDY DATE: 09/06/16 PATIENT NAME: LEGRANDE HAO DOB: July 05, 1941 MRN: [id number] ORDERING CLINICIAN: Antony Contras, MD CLINICAL HISTORY: 76 year old male with left eye vision loss. EXAM: MRA neck (without) TECHNIQUE: MR angiogram of the neck was obtained utilizing 2D time-of-flight noncontrast views and computerized reconstructions were obtained. CONTRAST: no IMAGING SITE: [facility] [address] (1.5 Tesla MRI)  FINDINGS: This study is of adequate technical quality.  There is anterograde flow in the bilateral vertebral and left carotid arteries  on 2D-TOF views.  The left common, internal and external carotid arteries have no stenosis.  The left vertebral artery has no stenosis from its origin up to the vertebrobasilar junction.  The right common and external carotid arteries have no stenosis. The right internal carotid artery is not visualized. The right vertebral artery is hypoplastic. Limited views of the intracranial vasculature are unremarkable.   Abnormal MRA neck (without) demonstrating: 1. Chronic right internal carotid artery occlusion. 2. Hypoplastic right vertebral artery. INTERPRETING PHYSICIAN: Penni Bombard, MD Certified in Neurology, Neurophysiology and Neuroimaging [facility] [address] [place], [place] [phone] (  [phone]   Mr Brain Wo Contrast  Result Date: 09/09/2016 [facility] NEUROIMAGING REPORT STUDY DATE: 09/06/16 PATIENT NAME: JARREAU CALLANAN DOB: Jul 19, 1940 MRN: [id number] ORDERING CLINICIAN: Antony Contras, MD CLINICAL HISTORY: 76 year old male with left eye vision loss. History of stroke. EXAM: MRI brain (without) TECHNIQUE: MRI of the brain without contrast was obtained utilizing 5 mm axial slices with T1, T2, T2 flair, SWI and diffusion weighted views.  T1 sagittal and T2 coronal views were obtained. CONTRAST: no IMAGING SITE: [facility] [address] (1.5 Tesla MRI)  FINDINGS: No abnormal lesions are seen on diffusion-weighted views to suggest acute ischemia. The cortical sulci, fissures and cisterns are notable for mild diffuse atrophy. Lateral, third and fourth ventricle are normal in size and appearance. No extra-axial fluid collections are seen. No evidence of mass effect or midline shift.  Moderate periventricular and subcortical chronic small vessel ischemic disease. Right anterior frontal encephalomalacia and gliosis related to chronic ischemic infarction. On sagittal views the posterior fossa, pituitary gland and corpus callosum are unremarkable.  Multiple left parietal and left temporal chronic cerebral microhemorrhages on SWI views, raising possibility of underlying amyloid angiopathy. The orbits and their contents, paranasal sinuses and calvarium are unremarkable.  Intracranial flow voids are present., except for abnormal T2 signal in the right internal carotid artery, likely related to slow flow or occlusion.   Abnormal MRI brain (without) demonstrating: 1. Chronic right anterior frontal ischemic infarction. 2. Moderate periventricular and subcortical chronic small vessel ischemic disease. 3. Multiple left parietal and left temporal chronic cerebral microhemorrhages on SWI views, raising possibility of underlying amyloid angiopathy. 4. Chronic right internal carotid artery occlusion. 5. No acute findings. 6. Compared to MRI on 09/23/05, there has been mild increase in chronic small vessel ischemic disease and atrophy. INTERPRETING PHYSICIAN: Penni Bombard, MD Certified in Neurology, Neurophysiology and Neuroimaging [facility] [address] [place], [place] [address] [phone]   Dg Chest [place] 1 View  Result Date: 09/22/2016 CLINICAL DATA:  76 year old male with shortness of breath. EXAM: PORTABLE CHEST 1 VIEW COMPARISON:  Chest radiograph dated 11/02/2015 FINDINGS: There is moderate cardiomegaly. There is COPD changes with increased interstitial prominence. Bibasilar interstitial crowding and atelectatic changes noted. There is no focal consolidation, pleural effusion, or pneumothorax. No acute osseous pathology. IMPRESSION: 1. No acute cardiopulmonary process. 2. Cardiomegaly. Electronically Signed   By: Anner Crete M.D.   On: 09/22/2016 22:06    EKG & Cardiac Imaging    EKG: Sinus tachycardia 110 bpm,  Right axis deviation, Non-specific diffuse ST/T changes  Echocardiogram:   08/08/2016 Study Conclusions  - Left ventricle: The cavity size was normal. Wall thickness was   normal. Systolic  function was moderately reduced. The estimated   ejection fraction was in the range of 35% to 40%. Diffuse   hypokinesis. Doppler parameters are consistent with abnormal left   ventricular relaxation (grade 1 diastolic dysfunction). - Ventricular septum: The contour showed diastolic flattening and   systolic flattening. - Aortic valve: There was trivial regurgitation. - Right ventricle: The cavity size was severely dilated. Systolic   function was severely reduced. - Right atrium: The atrium was massively dilated. - Atrial septum: The septum bowed from right to left, consistent   with increased right atrial pressure. - Tricuspid valve: There was wide-open regurgitation. - Pulmonary arteries: PA peak pressure: 56 mm Hg (S). - Pericardium, extracardiac: A trivial pericardial effusion was   identified.  Impressions:  - Moderate  global reduction in LV systolic function; grade 1   diastolic dysfunction; D shaped septum; trace AI; severe RAE and   RVE; severely reduced RV function; wide open TR; findings c/w   severe pulmonary hypertension. _____________________________________________________________________  03/16/16 Right Heart Cath  Conclusion     Hemodynamic findings consistent with severe pulmonary hypertension.   Severe pulmonary hypertension with peak pulmonary systolic pressure of 70 mmHg. PA mean ranged between 44 and 47 mmHg due to respiratory variation.  Normal pulmonary capillary wedge pressure with mean of 5 mmHg.  Cardiac output 3.06 L/m    04/28/15       Right/Left Heart Cath and Coronary Angiography    Conclusion   1. Mid RCA to Dist RCA lesion, 65% stenosed. 2. 1st RPLB lesion, 70% stenosed. 3. Ost Cx to Prox Cx lesion, 50% stenosed. 4. Mid Cx to Dist Cx lesion, 65% stenosed. 5. Prox LAD to Dist LAD lesion, 45% stenosed. 6. 1st Diag lesion, 85% stenosed.   Chronic stable coronary artery disease with lesions as noted above. There is moderate  to moderately severe involvement in all 3 territories.  Chronic systolic heart failure with normal left ventricular end-diastolic pressure. EF 40%.  Severe pulmonary hypertension. Recommendations:  Medical therapy per cardiology and pulmonary consultants.     Assessment & Plan    Near syncope -Dizziness with walking. Pt was using less oxygen than he usually does during activity. Has had several previous episodes of dizziness including one that led to a fall in 07/2016. -He had no chest pain, new dypnea or palpitations preceding or with the dizziness.  -Is being aggressively diuresed for CHF/advanced pulmonary disease with right sided heart failure -His BP has been running low and PCP recently decreased his ARB. 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 -No increased shortness of breath or orthopnea. Edema and weight have been gradually improving with diuresis over the past couple of months.  -BNP 1520. CXR without edema. -Home diuresis with lasix 80 mg bid and spironolactone 25 mg daily -Currently receiving lasix 80 mg IV BID and spironolactone -Has been diuresing at home with decreased weight of over 10 pounds and decrease in edema. --Continue to diurese  PAF -Maintaining sinus rhythm  -CHA2DS2-VASc Score 6 (CHF, HTN, age, vasc dz, CVA (2)). Coumadin therapy for stroke risk reduction. managed by pharmacy  CKD -Recent baseline SCr around 2 -SCr  2.45-->2.13 (pt was given IV fluid at admission)  Interstitial lung disease -Followed by Dr. Chase Caller. On pirfenidone and home O2. Not a candidate for pulmonary vasodilators.  HTN -Not hypertensive, BP borderline low with diuretic therapy. Will check orthostatic VS -Home valsartan was recently decreased from 160 mg to 80 mg by his PCP for low BP  CAD -3 vessel moderate-moderately severe disease, treating medically -Continue aspirin and statin. No BB due to severe lung disease.  Tildon Husky, NP-C 09/23/2016, 10:23 AM Pager: [phone]

## 2016-09-23 NOTE — Telephone Encounter (Signed)
Called [employer] to request status update of copay assistance- patient has been denied assistance d/t not meeting income criteria.  As of 08/19/2016 pt's copay for medication is $35/month through [facility] with [facility] as the payer.  Spoke with a rep at [employer] who verified copay amount.  Pt's med has not yet been shipped d/t [facility] not being able to successfully contact patient.  Verified phone numbers on file for pt.  [facility] will call pt again to schedule shipment.    Will route to [facility] for follow up.

## 2016-09-24 DIAGNOSIS — I5032 Chronic diastolic (congestive) heart failure: Secondary | ICD-10-CM

## 2016-09-24 LAB — PROTIME-INR
INR: 2.48
Prothrombin Time: 27.3 seconds — ABNORMAL HIGH (ref 11.4–15.2)

## 2016-09-24 LAB — CBC
HEMATOCRIT: 40.8 % (ref 39.0–52.0)
Hemoglobin: 13.5 g/dL (ref 13.0–17.0)
MCH: 29.8 pg (ref 26.0–34.0)
MCHC: 33.1 g/dL (ref 30.0–36.0)
MCV: 90.1 fL (ref 78.0–100.0)
Platelets: 121 10*3/uL — ABNORMAL LOW (ref 150–400)
RBC: 4.53 MIL/uL (ref 4.22–5.81)
RDW: 19.7 % — ABNORMAL HIGH (ref 11.5–15.5)
WBC: 4.2 10*3/uL (ref 4.0–10.5)

## 2016-09-24 LAB — COMPREHENSIVE METABOLIC PANEL
ALK PHOS: 57 U/L (ref 38–126)
ALT: 16 U/L — AB (ref 17–63)
ANION GAP: 4 — AB (ref 5–15)
AST: 21 U/L (ref 15–41)
Albumin: 3 g/dL — ABNORMAL LOW (ref 3.5–5.0)
BILIRUBIN TOTAL: 1.2 mg/dL (ref 0.3–1.2)
BUN: 44 mg/dL — ABNORMAL HIGH (ref 6–20)
CO2: 27 mmol/L (ref 22–32)
CREATININE: 1.92 mg/dL — AB (ref 0.61–1.24)
Calcium: 8.8 mg/dL — ABNORMAL LOW (ref 8.9–10.3)
Chloride: 108 mmol/L (ref 101–111)
GFR calc non Af Amer: 33 mL/min — ABNORMAL LOW (ref >60)
GFR, EST AFRICAN AMERICAN: 38 mL/min — AB (ref >60)
GLUCOSE: 77 mg/dL (ref 65–99)
Potassium: 4.3 mmol/L (ref 3.5–5.1)
SODIUM: 139 mmol/L (ref 135–145)
TOTAL PROTEIN: 6.1 g/dL — AB (ref 6.5–8.1)

## 2016-09-24 MED ORDER — PIRFENIDONE 267 MG PO CAPS
2.0000 | ORAL_CAPSULE | Freq: Three times a day (TID) | ORAL | Status: DC
  Administered 2016-09-24 – 2016-09-25 (×5): 2 via ORAL
  Filled 2016-09-24 (×5): qty 2

## 2016-09-24 NOTE — Consult Note (Signed)
Cardiology Consult    Patient ID: Patrick Weiss MRN: [id number], DOB/AGE: [birth year]   Admit date: 09/22/2016 Date of Consult: 09/24/2016  Primary Physician: Foye Spurling, MD Reason for Consult: CHF Primary Cardiologist: Dr. Tamala Julian Requesting Provider: Dr. Allyson Sabal  History of Present Illness    Patrick Weiss is a 76 y.o. 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   This occurred in the setting of low O2 supplementation due to low O2 in his portable tank.  Doing better today.  Breathing is near baseline.  Has chronic SOB and edema.   Past Medical History   Past Medical History:  Diagnosis Date  . A-fib (HCC)    paroxysmal  . CHF (congestive heart failure) ([place])   . COPD (chronic obstructive pulmonary disease) ([place])   . Hypertension   . Pulmonary fibrosis ([place])   . Shortness of breath dyspnea   . 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 BACK SURGERY  2001  . CARDIAC CATHETERIZATION N/A 04/28/2015   Procedure: Right/Left Heart Cath and Coronary Angiography;  Surgeon: Belva Crome, MD;  Location: [place] CV [facility];  Service: Cardiovascular;  Laterality: N/A;  . CARDIAC CATHETERIZATION N/A 03/16/2016   Procedure: Right Heart Cath;  Surgeon: Belva Crome, MD;  Location: [place] [facility];  Service: Cardiovascular;  Laterality: N/A;  . FEMUR FRACTURE SURGERY    . TIBIA FRACTURE SURGERY  1975     Allergies  No Known Allergies  Inpatient Medications    . atorvastatin  10 mg Oral q1800  . budesonide  0.25 mg Inhalation BID  . Pirfenidone  2 capsule Oral TID WC  . spironolactone  25 mg Oral Daily  . tiotropium  18 mcg Inhalation  Daily  . [START ON 09/25/2016] warfarin  2.5 mg Oral Once per day on Sun  . warfarin  5 mg Oral Once per day on Mon Tue Wed Thu Fri Sat  . Warfarin - Pharmacist Dosing Inpatient   Does not apply q1800    Physical Exam   Blood pressure 106/75, pulse 80, temperature 97.8 F (36.6 C), temperature source Oral, resp. rate 18, height _0  (1.778 m), weight 184 lb 8 oz (83.7 kg), SpO2 94 %.  General: Pleasant, NAD, chronically ill, wearing O2 Psych: Normal affect. Neuro: Alert and oriented X 3. Moves all extremities spontaneously. HEENT: Normal    Neck: Supple without bruits, JVD present Lungs:  Resp regular and unlabored, Fibrous crackles in lower lobes Heart: RRR no s3, s4, or murmurs. Abdomen: Soft, non-tender, non-distended, BS + x 4.  Extremities: No clubbing, or cyanosis. 1+ lower leg edema. DP/PT/Radials 2+ and equal bilaterally.  Labs    Troponin [facility] Test)  Recent Labs  09/22/16 2022  TROPIPOC 0.02   No results for input(s): CKTOTAL, CKMB, TROPONINI in the last 72 hours. Lab Results  Component Value Date   WBC 4.2 09/24/2016   HGB 13.5 09/24/2016   HCT 40.8 09/24/2016   MCV 90.1 09/24/2016   PLT 121 (L) 09/24/2016     Recent Labs Lab 09/24/16 0455  NA 139  K  4.3  CL 108  CO2 27  BUN 44*  CREATININE 1.92*  CALCIUM 8.8*  PROT 6.1*  BILITOT 1.2  ALKPHOS 57  ALT 16*  AST 21  GLUCOSE 77   Lab Results  Component Value Date   CHOL 114 (L) 06/15/2015   HDL 41 06/15/2015   LDLCALC 61 06/15/2015   TRIG 61 06/15/2015      Assessment & Plan    Patrick Weiss is a 75 y.o. 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   This occurred in the setting of low O2 supplementation due to low O2 in his portable tank.   He has chronic lung  disease as well as advanced CHF.  Very little room for improvement clinically I am afraid.  Near syncope He is clear that he became acutely ill and fatigued when his O2 talk was low, leading to his collapse.  I do not feel that aggressive workup would be beneficial at this time.  He is at risk for arrhythmias/ sudden death however he is not a candidate for ICD or advanced CV procedures.  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 -No increased shortness of breath or orthopnea. Edema and weight have been gradually improving with diuresis over the past couple of months.  --I would advise that he continue to diurese despite low BP.  His BP is unlikely to improve however off of diuretics, he could become quite ill.  PAF -Maintaining sinus rhythm  -CHA2DS2-VASc Score 6 (CHF, HTN, age, vasc dz, CVA (2)). Coumadin therapy for stroke risk reduction. managed by pharmacy  CKD -Recent baseline SCr around 2  Interstitial lung disease -Followed by Dr. Chase Caller. On pirfenidone and home O2. Not a candidate for pulmonary vasodilators.  CAD -3 vessel moderate-moderately severe disease, treating medically -Continue aspirin and statin. No BB due to severe lung disease. Follow-up with Dr Tamala Julian in the office  Anticipate discharge very soon Very little room to improve in the hospital Consider palliative discussions  Will need to follow closely with Dr Lynford Citizen in the office.  Very sick patient.  A high level of decision making was required for this encounter  Signed, Thompson Grayer, MD 09/24/2016, 1:22 PM Pager: [phone]

## 2016-09-24 NOTE — Progress Notes (Signed)
TRH on call paged. Patient's Bp 72/52. Patient asymptomatic. On call provider returned page. Order to continue to monitor patient and report any further changes.

## 2016-09-24 NOTE — Progress Notes (Signed)
ANTICOAGULATION CONSULT NOTE - Follow Up Consult  Pharmacy Consult for Warfarin  Indication: atrial fibrillation  No Known Allergies  Patient Measurements: Height: _0  (177.8 cm) Weight: 184 lb 8 oz (83.7 kg) IBW/kg (Calculated) : 73  Vital Signs: Temp: 97.3 F (36.3 C) (03/17 0512) Temp Source: Oral (03/17 0512) BP: 106/78 (03/17 0512) Pulse Rate: 80 (03/17 0512)  Labs:  Recent Labs  09/22/16 2007 09/23/16 0603 09/24/16 0455  HGB 15.4  --  13.5  HCT 45.7  --  40.8  PLT 125*  --  121*  LABPROT 29.6*  --   --   INR 2.74  --   --   CREATININE 2.45* 2.13* 1.92*    Estimated Creatinine Clearance: 34.9 mL/min (A) (by C-G formula based on SCr of 1.92 mg/dL (H)).   Medical History: Past Medical History:  Diagnosis Date  . A-fib (HCC)    paroxysmal  . CHF (congestive heart failure) ([place])   . COPD (chronic obstructive pulmonary disease) ([place])   . Hypertension   . Pulmonary fibrosis ([place])   . Shortness of breath dyspnea   . 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 INR remain therapeutic today at 2.48. CBC stable, no s/sx of bleeding  PTA warfarin dosing: 2.5 mg Sun, 5 mg all other days  Goal of Therapy:  INR 2-3 Monitor platelets by anticoagulation protocol: Yes   Plan:  -Cont warfarin home dosing with 5 mg tonight -Daily PT/INR -Monitor CBC, s/sx of bleeding   Gwenlyn Perking, PharmD PGY1 Pharmacy Resident Pager: [phone] 09/24/2016 8:07 AM

## 2016-09-24 NOTE — Progress Notes (Signed)
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 Patient apparently had 2 syncopal episodes, was found to be hypoxic on presentation. Also noted to be hypotensive and received fluid boluses.  Assessment and plan 1. Acute on chronic hypoxic respiratory failure:  Reports his baseline is 85-90% at home, suspect with this temp O2 tank he just couldn't keep up with activity and his O2 sat dropped to something much lower like 50 or 60% and that caused his syncope.  No recent dyspnea, sputum production, fever, or cough and CXR normal, so doubt pneumonia or IPF flare.  On warfarin, doubt PE.  Chronically fluid overloaded, but actually weight lowest it has been for a while, plus CXR clear, doubt hypoxia is from CHF. Oxygen requirements better after diuresis, patient is requiring 4 L at rest  ABG showed a pH of 7.39, PCO2 37, PO2 61. chronic hypoxemic respiratory failure due to combination of emphysema and idiopathic pulmonary fibrosis [diagnoses given 03/11/2015). This is associated with cor pulmonale and also chronic systolic heart failure ejection fraction 45% in February 2016. He is on anti-fibrotic therapy Esbriet since approximately November  2016. Requested pulmonary to see patient 3/16, as a social visit   2. 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  Last EF two months ago 35% to 40%.  Has impressive LE edema, >>BNP, and distended ext neck veins, but weight overall down. Cardiology consult for congestive heart failure Hold Lasix 80 mg IV twice a day secondary to low blood pressure Continue Spiriva lactone -No BB given lung disease -Hold ARB tonight -Continue statin Creatinine improved with diuresis Cardiology to make further adjustments  3. Hypotension:  Given his progressive weight decline, suspect this is really just from diuresis and his ARB.   -Hold ARB today    4. COPD/emphysema:  No wheezing, no change in respiratory symptoms. Do not feel this is active. -Continue Tudorza as formulary alternative -Continue Flovent  5. IPF:  -Continue pirfenidone  6. Paroxysmal atrial fibrillation:  CHADS2vasc 5.   -Continue warfarin  7. Other medications:  -Continue allopurinol  8. CKD stage III Baseline creatinine around 2.0 Creatinine 2.45 on admission, down to 1.92     DVT prophylaxsis Coumadin  Code Status:  DO NOT RESUSCITATE    Family Communication: Discussed in detail with the patient, all imaging results, lab results explained to the patient   Disposition Plan:  Cardiology consult      Consultants:  Cardiology  pulmonary  Procedures:  None  Antibiotics: Anti-infectives    None         HPI/Subjective: Patient's Bp 72/52 this morning. Patient asymptomatic  Objective: Vitals:  09/24/16 0050 09/24/16 0051 09/24/16 0100 09/24/16 0512  BP: (!) 79/62 (!) 80/50 (!) 72/52 106/78  Pulse: 83 83  80  Resp: 18   18  Temp: 98 F (36.7 C)   97.3 F (36.3 C)  TempSrc: Oral   Oral  SpO2: 93%   94%  Weight:    83.7 kg (184 lb 8 oz)  Height:        Intake/Output Summary (Last 24 hours) at 09/24/16 0738 Last data  filed at 09/24/16 0539  Gross per 24 hour  Intake             1146 ml  Output             2375 ml  Net            -1229 ml    Exam:  Examination:  General exam: Appears calm and comfortable  Respiratory system: Clear to auscultation. Respiratory effort normal. Cardiovascular system: S1 & S2 heard, RRR. No JVD, murmurs, rubs, gallops or clicks. No pedal edema. Gastrointestinal system: Abdomen is nondistended, soft and nontender. No organomegaly or masses felt. Normal bowel sounds heard. Central nervous system: Alert and oriented. No focal neurological deficits. Extremities: Symmetric 5 x 5 power. Skin: No rashes, lesions or ulcers Psychiatry: Judgement and insight appear normal. Mood & affect appropriate.     Data Reviewed: I have personally reviewed following labs and imaging studies  Micro Results No results found for this or any previous visit (from the past 240 hour(s)).  Radiology Reports Mr [name] ZO Contrast  Result Date: 09/09/2016 [facility] NEUROIMAGING REPORT STUDY DATE: 09/06/16 PATIENT NAME: Patrick Weiss DOB: 12/28/1940 MRN: [id number] ORDERING CLINICIAN: Antony Contras, MD CLINICAL HISTORY: 76 year old male with left sided vision loss. EXAM: MRA head (without) TECHNIQUE: MR angiogram of the head was obtained utilizing 3D time of flight sequences from below the vertebrobasilar junction up to the intracranial vasculature without contrast.  Computerized reconstructions were obtained. CONTRAST: no IMAGING SITE: [facility] [address] (1.5 Tesla MRI)  FINDINGS: This study is of adequate technical quality. No signal seen with the right internal carotid artery proximally. There is distal reconstitution of the right internal carotid artery via the right posterior communicating artery. The right internal carotid artery then bifurcates into the right middle and anterior cerebral artery. There is relatively decreased flow signal within the right  middle cerebral artery compared to the left side. Right A1 segment has decreased flow signal. Bilateral A2 segments appear normal. The left intracerebral artery appears to arise from the anterior communicating artery. There is probable artifactual signal loss in the proximal A2 segments. There is normal appearing flow signal distally in the bilateral anterior cerebral arteries. Flow signal of the left internal carotid arteries have no stenosis. The left middle cerebral artery has no stenosis. The left vertebral artery is dominant and supplies the basilar artery. No significant stenosis. Bilateral posterior cerebral arteries arise from the basilar artery. Bilateral P1 segments appear normal. More distally the bilateral posterior cerebral arteries have decreased flow signal and irregular appearance consistent with diffuse intracranial atherosclerosis. No aneurysmal dilatations are seen.   Abnormal MRA head (without) demonstrating: 1. 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 2. Relatively decreased flow signal in the right middle cerebral artery compared to the left side. 3. Decreased flow signal in the proximal bilateral anterior cerebral arteries which may be artifactual. 4. Chronic occlusion of right vertebral  artery. 5. Bilateral posterior cerebral artery intracranial atherosclerosis. INTERPRETING PHYSICIAN: Penni Bombard, MD Certified in Neurology, Neurophysiology and Neuroimaging [facility] [address] [place], [place] [address] [phone]   Mr Jodene Nam Neck Wo Contrast  Result Date: 09/09/2016 [facility] NEUROIMAGING REPORT STUDY DATE: 09/06/16 PATIENT NAME: Patrick Weiss DOB: 08/17/1940 MRN: [id number] ORDERING CLINICIAN: Antony Contras, MD CLINICAL HISTORY: 76 year old male with left eye vision loss. EXAM: MRA neck (without) TECHNIQUE: MR angiogram of the neck was obtained utilizing  2D time-of-flight noncontrast views and computerized reconstructions were obtained. CONTRAST: no IMAGING SITE: [facility] [address] (1.5 Tesla MRI)  FINDINGS: This study is of adequate technical quality.  There is anterograde flow in the bilateral vertebral and left carotid arteries on 2D-TOF views.  The left common, internal and external carotid arteries have no stenosis.  The left vertebral artery has no stenosis from its origin up to the vertebrobasilar junction.  The right common and external carotid arteries have no stenosis. The right internal carotid artery is not visualized. The right vertebral artery is hypoplastic. Limited views of the intracranial vasculature are unremarkable.   Abnormal MRA neck (without) demonstrating: 1. Chronic right internal carotid artery occlusion. 2. Hypoplastic right vertebral artery. INTERPRETING PHYSICIAN: Penni Bombard, MD Certified in Neurology, Neurophysiology and Neuroimaging [facility] [address] [place], [place] [address] [phone]   Mr Brain Wo Contrast  Result Date: 09/09/2016 [facility] NEUROIMAGING REPORT STUDY DATE: 09/06/16 PATIENT NAME: Patrick Weiss DOB: July 10, 1941 MRN: [id number] ORDERING CLINICIAN: Antony Contras, MD CLINICAL HISTORY: 76 year old male with left eye vision loss. History of stroke. EXAM: MRI brain (without) TECHNIQUE: MRI of the brain without contrast was obtained utilizing 5 mm axial slices with T1, T2, T2 flair, SWI and diffusion weighted views.  T1 sagittal and T2 coronal views were obtained. CONTRAST: no IMAGING SITE: [facility] [address] (1.5 Tesla MRI)  FINDINGS: No abnormal lesions are seen on diffusion-weighted views to suggest acute ischemia. The cortical sulci, fissures and cisterns are notable for mild diffuse atrophy. Lateral, third and fourth ventricle are normal in size and appearance. No extra-axial fluid collections are seen. No  evidence of mass effect or midline shift.  Moderate periventricular and subcortical chronic small vessel ischemic disease. Right anterior frontal encephalomalacia and gliosis related to chronic ischemic infarction. On sagittal views the posterior fossa, pituitary gland and corpus callosum are unremarkable. Multiple left parietal and left temporal chronic cerebral microhemorrhages on SWI views, raising possibility of underlying amyloid angiopathy. The orbits and their contents, paranasal sinuses and calvarium are unremarkable.  Intracranial flow voids are present., except for abnormal T2 signal in the right internal carotid artery, likely related to slow flow or occlusion.   Abnormal MRI brain (without) demonstrating: 1. Chronic right anterior frontal ischemic infarction. 2. Moderate periventricular and subcortical chronic small vessel ischemic disease. 3. Multiple left parietal and left temporal chronic cerebral microhemorrhages on SWI views, raising possibility of underlying amyloid angiopathy. 4. Chronic right internal carotid artery occlusion. 5. No acute findings. 6. Compared to MRI on 09/23/05, there has been mild increase in chronic small vessel ischemic disease and atrophy. INTERPRETING PHYSICIAN: Penni Bombard, MD Certified in Neurology, Neurophysiology and Neuroimaging [facility] [address] [place], [place] [address] [phone]   Dg Chest [place] 1 View  Result Date: 09/22/2016 CLINICAL DATA:  76 year old male with shortness of breath. EXAM: PORTABLE CHEST 1  VIEW COMPARISON:  Chest radiograph dated 11/02/2015 FINDINGS: There is moderate cardiomegaly. There is COPD changes with increased interstitial prominence. Bibasilar interstitial crowding and atelectatic changes noted. There is no focal consolidation, pleural effusion, or pneumothorax. No acute osseous pathology. IMPRESSION: 1. No acute cardiopulmonary process. 2. Cardiomegaly. Electronically Signed   By: Anner Crete M.D.   On: 09/22/2016 22:06     CBC  Recent Labs Lab 09/22/16 2007 09/24/16 0455  WBC 4.4 4.2  HGB 15.4 13.5  HCT 45.7 40.8  PLT 125* 121*  MCV 90.1 90.1  MCH 30.4 29.8  MCHC 33.7 33.1  RDW 20.2* 19.7*    Chemistries   Recent Labs Lab 09/22/16 2007 09/23/16 0603 09/24/16 0455  NA 141 143 139  K 4.6 4.6 4.3  CL 106 106 108  CO2 _0 GLUCOSE 135* 81 77  BUN 53* 52* 44*  CREATININE 2.45* 2.13* 1.92*  CALCIUM 9.7 9.2 8.8*  AST  --   --  21  ALT  --   --  16*  ALKPHOS  --   --  57  BILITOT  --   --  1.2   ------------------------------------------------------------------------------------------------------------------ estimated creatinine clearance is 34.9 mL/min (A) (by C-G formula based on SCr of 1.92 mg/dL (H)). ------------------------------------------------------------------------------------------------------------------ No results for input(s): HGBA1C in the last 72 hours. ------------------------------------------------------------------------------------------------------------------ No results for input(s): CHOL, HDL, LDLCALC, TRIG, CHOLHDL, LDLDIRECT in the last 72 hours. ------------------------------------------------------------------------------------------------------------------ No results for input(s): TSH, T4TOTAL, T3FREE, THYROIDAB in the last 72 hours.  Invalid input(s): FREET3 ------------------------------------------------------------------------------------------------------------------ No results for input(s): VITAMINB12, FOLATE, FERRITIN, TIBC, IRON, RETICCTPCT in the last 72 hours.  Coagulation profile  Recent Labs Lab 09/22/16 2007  INR 2.74    No results for input(s): DDIMER in the last 72 hours.  Cardiac Enzymes No results for input(s): CKMB, TROPONINI, MYOGLOBIN in the last 168 hours.  Invalid input(s):  CK ------------------------------------------------------------------------------------------------------------------ Invalid input(s): POCBNP   CBG: No results for input(s): GLUCAP in the last 168 hours.     Studies: Dg Chest Port 1 View  Result Date: 09/22/2016 CLINICAL DATA:  76 year old male with shortness of breath. EXAM: PORTABLE CHEST 1 VIEW COMPARISON:  Chest radiograph dated 11/02/2015 FINDINGS: There is moderate cardiomegaly. There is COPD changes with increased interstitial prominence. Bibasilar interstitial crowding and atelectatic changes noted. There is no focal consolidation, pleural effusion, or pneumothorax. No acute osseous pathology. IMPRESSION: 1. No acute cardiopulmonary process. 2. Cardiomegaly. Electronically Signed   By: Anner Crete M.D.   On: 09/22/2016 22:06      No results found for: HGBA1C Lab Results  Component Value Date   LDLCALC 61 06/15/2015   CREATININE 1.92 (H) 09/24/2016       Scheduled Meds: . atorvastatin  10 mg Oral q1800  . budesonide  0.25 mg Inhalation BID  . Pirfenidone  2 capsule Oral TID  . spironolactone  25 mg Oral Daily  . tiotropium  18 mcg Inhalation Daily  . [START ON 09/25/2016] warfarin  2.5 mg Oral Once per day on Sun  . warfarin  5 mg Oral Once per day on Mon Tue Wed Thu Fri Sat  . Warfarin - Pharmacist Dosing Inpatient   Does not apply q1800   Continuous Infusions:   LOS: 1 day    Time spent: >30 MINS    [facility] Pager [phone]. If 7PM-7AM, please contact night-coverage at [email or web address], password [facility] 09/24/2016, 7:38 AM  LOS: 1 day

## 2016-09-25 DIAGNOSIS — I2781 Cor pulmonale (chronic): Secondary | ICD-10-CM

## 2016-09-25 DIAGNOSIS — R0602 Shortness of breath: Secondary | ICD-10-CM

## 2016-09-25 LAB — PROTIME-INR
INR: 2.76
PROTHROMBIN TIME: 29.7 s — AB (ref 11.4–15.2)

## 2016-09-25 LAB — BASIC METABOLIC PANEL
ANION GAP: 4 — AB (ref 5–15)
BUN: 44 mg/dL — ABNORMAL HIGH (ref 6–20)
CALCIUM: 8.7 mg/dL — AB (ref 8.9–10.3)
CHLORIDE: 107 mmol/L (ref 101–111)
CO2: 26 mmol/L (ref 22–32)
Creatinine, Ser: 1.79 mg/dL — ABNORMAL HIGH (ref 0.61–1.24)
GFR calc Af Amer: 41 mL/min — ABNORMAL LOW (ref >60)
GFR calc non Af Amer: 36 mL/min — ABNORMAL LOW (ref >60)
Glucose, Bld: 73 mg/dL (ref 65–99)
Potassium: 4.5 mmol/L (ref 3.5–5.1)
SODIUM: 137 mmol/L (ref 135–145)

## 2016-09-25 MED ORDER — FUROSEMIDE 80 MG PO TABS
80.0000 mg | ORAL_TABLET | Freq: Two times a day (BID) | ORAL | Status: DC
  Administered 2016-09-25: 80 mg via ORAL
  Filled 2016-09-25: qty 1

## 2016-09-25 NOTE — Discharge Summary (Addendum)
Physician Discharge Summary  Patrick Weiss MRN: [id number] DOB/AGE: Dec 08, 1940 76 y.o.  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 Qty: 1 each, Refills: 11    allopurinol (ZYLOPRIM) 100 MG tablet Take 100 mg by mouth daily.    atorvastatin (LIPITOR) 10 MG tablet TAKE 1 TABLET(10 MG) BY MOUTH DAILY Qty: 90 tablet, Refills: 1    Calcium Carbonate-Vitamin D (CALCIUM 600+D) 600-200 MG-UNIT TABS Take 1 tablet by mouth 2 (two) times daily.    Carboxymethylcellulose Sodium (REFRESH TEARS OP) Place 1 drop into both eyes 3 (three) times daily as needed (for dry eyes).     docusate sodium (COLACE) 100 MG capsule Take 100 mg by mouth daily as needed for mild constipation.    fexofenadine (ALLEGRA) 180 MG tablet Take 180 mg by mouth daily.     fluticasone (FLOVENT HFA) 44 MCG/ACT inhaler Inhale 2 puffs into the lungs 2 (two) times daily. Qty: 10.6 g, Refills: 5    furosemide (LASIX) 80 MG  tablet Take 1 tablet (80 mg total) by mouth 2 (two) times daily. Qty: 180 tablet, Refills: 1    OXYGEN Inhale 3-4 L into the lungs continuous.    Pirfenidone 267 MG CAPS Take 2 capsules by mouth 3 (three) times daily.     spironolactone (ALDACTONE) 25 MG tablet Take 1 tablet (25 mg total) by mouth daily. Qty: 30 tablet, Refills: 11          warfarin (COUMADIN) 5 MG tablet TAKE AS DIRECTED Qty: 120 tablet, Refills: 1         Discharge Condition: Overall prognosis guarded,  Discharge Instructions Get Medicines reviewed and adjusted: Please take all your medications with you for your next visit with your Primary MD  Please request your Primary MD to go over all hospital tests and procedure/radiological results at the follow up, please ask your Primary MD to get all Hospital records sent to his/her office.  If you experience worsening of your admission symptoms, develop shortness of breath, life threatening emergency, suicidal or homicidal thoughts you must seek medical attention immediately by calling 911 or calling your MD immediately if symptoms less severe.  You must read complete instructions/literature along with all the possible adverse reactions/side effects for all the Medicines you take and that have been prescribed to you. Take any new Medicines after you have completely understood and accpet all the possible adverse reactions/side effects.   Do not drive when taking Pain medications.   Do not  take more than prescribed Pain, Sleep and Anxiety Medications  Special Instructions: If you have smoked or chewed Tobacco in the last 2 yrs please stop smoking, stop any regular Alcohol and or any Recreational drug use.  Wear Seat belts while driving.  Please note  You were cared for by a hospitalist during your hospital stay. Once you are discharged, your primary care physician will handle any further medical issues. Please note that NO REFILLS for any discharge medications  will be authorized once you are discharged, as it is imperative that you return to your primary care physician (or establish a relationship with a primary care physician if you do not have one) for your aftercare needs so that they can reassess your need for medications and monitor your lab values.     No Known Allergies    Disposition: 01-Home or Self Care   Consults:  Cardiology     Significant Diagnostic Studies:  Mr Patrick Weiss RX Contrast  Result Date: 09/09/2016 [facility] NEUROIMAGING REPORT STUDY DATE: 09/06/16 PATIENT NAME: Patrick Weiss DOB: 1941/06/25 MRN: [id number] ORDERING CLINICIAN: Antony Contras, MD CLINICAL HISTORY: 76 year old male with left sided vision loss. EXAM: Patrick head (without) TECHNIQUE: MR angiogram of the head was obtained utilizing 3D time of flight sequences from below the vertebrobasilar junction up to the intracranial vasculature without contrast.  Computerized reconstructions were obtained. CONTRAST: no IMAGING SITE: [facility] [address] (1.5 Tesla MRI)  FINDINGS: This study is of adequate technical quality. No signal seen with the right internal carotid artery proximally. There is distal reconstitution of the right internal carotid artery via the right posterior communicating artery. The right internal carotid artery then bifurcates into the right middle and anterior cerebral artery. There is relatively decreased flow signal within the right middle cerebral artery compared to the left side. Right A1 segment has decreased flow signal. Bilateral A2 segments appear normal. The left intracerebral artery appears to arise from the anterior communicating artery. There is probable artifactual signal loss in the proximal A2 segments. There is normal appearing flow signal distally in the bilateral anterior cerebral arteries. Flow signal of the left internal carotid arteries have no stenosis. The left middle cerebral artery has no stenosis.  The left vertebral artery is dominant and supplies the basilar artery. No significant stenosis. Bilateral posterior cerebral arteries arise from the basilar artery. Bilateral P1 segments appear normal. More distally the bilateral posterior cerebral arteries have decreased flow signal and irregular appearance consistent with diffuse intracranial atherosclerosis. No aneurysmal dilatations are seen.   Abnormal Patrick head (without) demonstrating: 1. 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 2. Relatively decreased flow signal in the right middle cerebral artery compared to the left side. 3. Decreased flow signal in the proximal bilateral anterior cerebral arteries which may be artifactual. 4. Chronic occlusion of right vertebral artery. 5. Bilateral posterior cerebral artery intracranial atherosclerosis. INTERPRETING PHYSICIAN: Penni Bombard, MD Certified in Neurology, Neurophysiology and Neuroimaging [facility] [address], [place], [place] [address] [phone]   Mr Jodene Nam Neck Wo Contrast  Result Date: 09/09/2016 [facility] NEUROIMAGING REPORT STUDY DATE: 09/06/16 PATIENT NAME: LERAY GARVERICK DOB: 1941-04-25 MRN: [id number] ORDERING CLINICIAN: Antony Contras, MD CLINICAL HISTORY: 76 year old male with left eye vision loss. EXAM: Patrick neck (without) TECHNIQUE: MR angiogram of the neck was obtained utilizing 2D time-of-flight noncontrast views and computerized reconstructions were obtained. CONTRAST: no  IMAGING SITE: [facility] [address] (1.5 Tesla MRI)  FINDINGS: This study is of adequate technical quality.  There is anterograde flow in the bilateral vertebral and left carotid arteries on 2D-TOF views.  The left common, internal and external carotid arteries have no stenosis.  The left vertebral artery has no stenosis from its origin up to the vertebrobasilar junction.  The  right common and external carotid arteries have no stenosis. The right internal carotid artery is not visualized. The right vertebral artery is hypoplastic. Limited views of the intracranial vasculature are unremarkable.   Abnormal Patrick neck (without) demonstrating: 1. Chronic right internal carotid artery occlusion. 2. Hypoplastic right vertebral artery. INTERPRETING PHYSICIAN: Penni Bombard, MD Certified in Neurology, Neurophysiology and Neuroimaging [facility] [address] [place], [place] [address] [phone]   Mr Brain Wo Contrast  Result Date: 09/09/2016 [facility] NEUROIMAGING REPORT STUDY DATE: 09/06/16 PATIENT NAME: TOBECHUKWU EMMICK DOB: 09-06-1940 MRN: [id number] ORDERING CLINICIAN: Antony Contras, MD CLINICAL HISTORY: 76 year old male with left eye vision loss. History of stroke. EXAM: MRI brain (without) TECHNIQUE: MRI of the brain without contrast was obtained utilizing 5 mm axial slices with T1, T2, T2 flair, SWI and diffusion weighted views.  T1 sagittal and T2 coronal views were obtained. CONTRAST: no IMAGING SITE: [facility] [address] (1.5 Tesla MRI)  FINDINGS: No abnormal lesions are seen on diffusion-weighted views to suggest acute ischemia. The cortical sulci, fissures and cisterns are notable for mild diffuse atrophy. Lateral, third and fourth ventricle are normal in size and appearance. No extra-axial fluid collections are seen. No evidence of mass effect or midline shift.  Moderate periventricular and subcortical chronic small vessel ischemic disease. Right anterior frontal encephalomalacia and gliosis related to chronic ischemic infarction. On sagittal views the posterior fossa, pituitary gland and corpus callosum are unremarkable. Multiple left parietal and left temporal chronic cerebral microhemorrhages on SWI views, raising possibility of underlying amyloid angiopathy. The orbits and their contents, paranasal sinuses  and calvarium are unremarkable.  Intracranial flow voids are present., except for abnormal T2 signal in the right internal carotid artery, likely related to slow flow or occlusion.   Abnormal MRI brain (without) demonstrating: 1. Chronic right anterior frontal ischemic infarction. 2. Moderate periventricular and subcortical chronic small vessel ischemic disease. 3. Multiple left parietal and left temporal chronic cerebral microhemorrhages on SWI views, raising possibility of underlying amyloid angiopathy. 4. Chronic right internal carotid artery occlusion. 5. No acute findings. 6. Compared to MRI on 09/23/05, there has been mild increase in chronic small vessel ischemic disease and atrophy. INTERPRETING PHYSICIAN: Penni Bombard, MD Certified in Neurology, Neurophysiology and Neuroimaging [facility] [address], [place],  [address] [phone]   Dg Chest [place] 1 View  Result Date: 09/22/2016 CLINICAL DATA:  76 year old male with shortness of breath. EXAM: PORTABLE CHEST 1 VIEW COMPARISON:  Chest radiograph dated 11/02/2015 FINDINGS: There is moderate cardiomegaly. There is COPD changes with increased interstitial prominence. Bibasilar interstitial crowding and atelectatic changes noted. There is no focal consolidation, pleural effusion, or pneumothorax. No acute osseous pathology. IMPRESSION: 1. No acute cardiopulmonary process. 2. Cardiomegaly. Electronically Signed   By: Anner Crete M.D.   On: 09/22/2016 22:06     2-D echo    Filed Weights   09/23/16 0133 09/24/16 0512 09/25/16 0607  Weight: 84.6 kg (186 lb 8.2 oz) 83.7 kg (184 lb 8 oz) 84.5 kg (186 lb 3.2 oz)  Labs: Results for orders placed or performed during the hospital encounter of 09/22/16 (from the past 48 hour(s))  CBC     Status: Abnormal   Collection Time: 09/24/16  4:55 AM  Result Value Ref Range   WBC 4.2 4.0 - 10.5 K/uL   RBC 4.53 4.22 - 5.81 MIL/uL   Hemoglobin 13.5 13.0 -  17.0 g/dL   HCT 40.8 39.0 - 52.0 %   MCV 90.1 78.0 - 100.0 fL   MCH 29.8 26.0 - 34.0 pg   MCHC 33.1 30.0 - 36.0 g/dL   RDW 19.7 (H) 11.5 - 15.5 %   Platelets 121 (L) 150 - 400 K/uL  Comprehensive metabolic panel     Status: Abnormal   Collection Time: 09/24/16  4:55 AM  Result Value Ref Range   Sodium 139 135 - 145 mmol/L   Potassium 4.3 3.5 - 5.1 mmol/L   Chloride 108 101 - 111 mmol/L   CO2 27 22 - 32 mmol/L   Glucose, Bld 77 65 - 99 mg/dL   BUN 44 (H) 6 - 20 mg/dL   Creatinine, Ser 1.92 (H) 0.61 - 1.24 mg/dL   Calcium 8.8 (L) 8.9 - 10.3 mg/dL   Total Protein 6.1 (L) 6.5 - 8.1 g/dL   Albumin 3.0 (L) 3.5 - 5.0 g/dL   AST 21 15 - 41 U/L   ALT 16 (L) 17 - 63 U/L   Alkaline Phosphatase 57 38 - 126 U/L   Total Bilirubin 1.2 0.3 - 1.2 mg/dL   GFR calc non Af Amer 33 (L) >60 mL/min   GFR calc Af Amer 38 (L) >60 mL/min    Comment: (NOTE) The eGFR has been calculated using the CKD EPI equation. This calculation has not been validated in all clinical situations. eGFR's persistently <60 mL/min signify possible Chronic Kidney Disease.    Anion gap 4 (L) 5 - 15  Protime-INR     Status: Abnormal   Collection Time: 09/24/16  9:04 AM  Result Value Ref Range   Prothrombin Time 27.3 (H) 11.4 - 15.2 seconds   INR 3.00   Basic metabolic panel     Status: Abnormal   Collection Time: 09/25/16  4:59 AM  Result Value Ref Range   Sodium 137 135 - 145 mmol/L   Potassium 4.5 3.5 - 5.1 mmol/L   Chloride 107 101 - 111 mmol/L   CO2 26 22 - 32 mmol/L   Glucose, Bld 73 65 - 99 mg/dL   BUN 44 (H) 6 - 20 mg/dL   Creatinine, Ser 1.79 (H) 0.61 - 1.24 mg/dL   Calcium 8.7 (L) 8.9 - 10.3 mg/dL   GFR calc non Af Amer 36 (L) >60 mL/min   GFR calc Af Amer 41 (L) >60 mL/min    Comment: (NOTE) The eGFR has been calculated using the CKD EPI equation. This calculation has not been validated in all clinical situations. eGFR's persistently <60 mL/min signify possible Chronic Kidney Disease.    Anion gap 4  (L) 5 - 15  Protime-INR     Status: Abnormal   Collection Time: 09/25/16  4:59 AM  Result Value Ref Range   Prothrombin Time 29.7 (H) 11.4 - 15.2 seconds   INR 2.76      Lipid Panel     Component Value Date/Time   CHOL 114 (L) 06/15/2015 0828   TRIG 61 06/15/2015 0828   HDL 41 06/15/2015 0828   CHOLHDL 2.8 06/15/2015 0828   VLDL 12 06/15/2015 9233  [place] 61 06/15/2015 0828     No results found for: HGBA1C   Lab Results  Component Value Date   LDLCALC 61 06/15/2015   CREATININE 1.79 (H) 09/25/2016     HPI :  76 yo male patient of Patrick. 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 He presents with a syncopal event - no clear prodrome, but proceeded by weakness. He has had hypotension recently, necessitating decrease in his BP medicine by his PCP, which could have played a role in this. Also, he has an EF around 35%, therefore an arrhyhtmia cannot be excluded. In addition, he is having his oxygen concentrator serviced and usually takes 5L, but has only been able to get 3L of O2 - therefore, may have been hypoxic. He also appears to be in decompensated biventricular failure - jugular veins are up with cannon A waves, 2+ bilateral LE edema and the abdomen is protuberant and firm. BNP is elevated. Patient started on IV diuretics - with improvement in his creatinine and hypoxia -  . Prognosis is poor, though, overall for this gentleman - his wife mentioned that palliative services at home have been arranged. He appropriately is DNR.  HOSPITAL COURSE:    1. Acute on chronic hypoxic respiratory failure: Reports his baseline is 85-90% at home, suspect with this temp O2 tank he just couldn't keep up with activity and his O2 sat dropped to something much lower like 50 or 60% and that caused his syncope. No recent dyspnea, sputum production, fever, or cough  and CXR normal, so doubt pneumonia or IPF flare. On warfarin, doubt PE. Chronically fluid overloaded, but actually weight lowest it has been for a while, plus CXR clear, doubt hypoxia is from CHF. -Spot pulse ox checks, keep 88-92% roughly  ABG showed a pH of 7.39, PCO2 37, PO2 61. chronic hypoxemic respiratory failure due to combination of emphysema and idiopathic pulmonary fibrosis [diagnoses given 03/11/2015). This is associated with cor pulmonale and also chronic systolic heart failure ejection fraction 45% in February 2016. He is on anti-fibrotic therapy Esbriet since approximately November 2016. Also have pulmonary see the patient    2. 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 -No increased shortness of breath or orthopnea. Edema and weight have been gradually improving with diuresis over the past couple of months.  Cardiology advises that he continue to diurese despite low BP.  His BP is unlikely to improve however off of diuretics, he could become quite ill. do not feel that aggressive workup would be beneficial at this time.  He is at risk for arrhythmias/ sudden death however he is not a candidate for ICD or advanced CV procedures.   3. Hypotension: Given his progressive weight decline, suspect this is  secondary to cor pulmonale,   chronic systolic heart failure His BP is unlikely to improve however off of diuretics, he could become quite ill.  4. COPD/emphysema: No wheezing, no change in respiratory symptoms. Do not feel this is active. -Continue Tudorza as formulary alternative -Continue Flovent  5. IPF: -Continue pirfenidone  6. Paroxysmal atrial fibrillation: CHA2DS2-VASc Score 6 (CHF, HTN, age, vasc dz, CVA (2)).  -Continue warfarin. INR 2.7 on the day of discharge  7. Other medications: -Continue  allopurinol  8. Chronic kidney disease stage III, baseline creatinine around 2, creatinine improved after diuresis   Discharge Exam:   Blood pressure 107/77, pulse 74, temperature 98.2 F (36.8 C), temperature source Oral, resp. rate 16, height _0  (1.778 m), weight 84.5 kg (186 lb 3.2 oz), SpO2 97 %. General exam: Appears calm and comfortable  Respiratory system: Clear to auscultation. Respiratory effort normal. Cardiovascular system: S1 & S2 heard, RRR. No JVD, murmurs, rubs, gallops or clicks. No pedal edema. Gastrointestinal system: Abdomen is nondistended, soft and nontender. No organomegaly or masses felt. Normal bowel sounds heard. Central nervous system: Alert and oriented. No focal neurological deficits. Extremities: Symmetric 5 x 5 power. Skin: No rashes, lesions or ulcers Psychiatry: Judgement and insight appear normal. Mood & affect appropriate.       Follow-up Information    Sinclair Grooms, MD. Call.   Specialty:  Cardiology Why:  To make appointment, hospital follow-up Contact information: [address] [place] [address] [phone]        Patrick Spurling, MD. Call.   Specialty:  Internal Medicine Why:  To make appointment, hospital follow-up Contact information: [address] Kris Hartmann [place] [address] [phone]           Signed: Reyne Dumas 09/25/2016, 8:46 AM        Time spent >45 mins

## 2016-09-25 NOTE — Progress Notes (Addendum)
Pt discharged home, IV and tele removed given discharge instructions questions answered. Wife at bedside. Placed on home O2 at 5L

## 2016-09-25 NOTE — Progress Notes (Signed)
Picked up medications from Pharmacy returned 70 tabs to patient.

## 2016-09-25 NOTE — Progress Notes (Signed)
SUBJECTIVE: The patient is doing reasonably well today.  He feels at his baseline.  SOB is stable.  No further presyncope.     Marland Kitchen atorvastatin  10 mg Oral q1800  . budesonide  0.25 mg Inhalation BID  . furosemide  80 mg Oral BID  . Pirfenidone  2 capsule Oral TID WC  . spironolactone  25 mg Oral Daily  . tiotropium  18 mcg Inhalation Daily  . warfarin  2.5 mg Oral Once per day on Sun  . warfarin  5 mg Oral Once per day on Mon Tue Wed Thu Fri Sat  . Warfarin - Pharmacist Dosing Inpatient   Does not apply q1800     OBJECTIVE: Physical Exam: Vitals:   09/25/16 0042 09/25/16 0607 09/25/16 0950 09/25/16 0951  BP: 97/65 107/77    Pulse: 84 74    Resp: 18 16    Temp: 98.1 F (36.7 C) 98.2 F (36.8 C)    TempSrc: Oral Oral    SpO2: 97% 97% 92% 92%  Weight:  186 lb 3.2 oz (84.5 kg)    Height:        Intake/Output Summary (Last 24 hours) at 09/25/16 1133 Last data filed at 09/25/16 0608  Gross per 24 hour  Intake              960 ml  Output             1190 ml  Net             -230 ml    Telemetry reveals sinus rhythm  GEN- The patient is chronically ill appearing, alert and oriented x 3 today.   Head- normocephalic, atraumatic Eyes-  Sclera clear, conjunctiva pink Ears- hearing intact Oropharynx- clear Neck- supple, + JVD Lungs- fibrous crackles, normal work of breathing Heart- Regular rate and rhythm  GI- soft, NT, ND, + BS Extremities- no clubbing, cyanosis, + edema Skin- no rash or lesion Psych- euthymic mood, full affect Neuro- strength and sensation are intact  LABS: Basic Metabolic Panel:  Recent Labs  09/24/16 0455 09/25/16 0459  NA 139 137  K 4.3 4.5  CL 108 107  CO2 27 26  GLUCOSE 77 73  BUN 44* 44*  CREATININE 1.92* 1.79*  CALCIUM 8.8* 8.7*   Liver Function Tests:  Recent Labs  09/24/16 0455  AST 21  ALT 16*  ALKPHOS 57  BILITOT 1.2  PROT 6.1*  ALBUMIN 3.0*   No results for input(s): LIPASE, AMYLASE in the last 72  hours. CBC:  Recent Labs  09/22/16 2007 09/24/16 0455  WBC 4.4 4.2  HGB 15.4 13.5  HCT 45.7 40.8  MCV 90.1 90.1  PLT 125* 121*    ASSESSMENT AND PLAN:   Patrick Weiss is a 76 y.o. with past medical history significant for CAD, PAF on coumadin, HTN, CVA, COPD, combined S/D CHF, IPF with right heart failure, COPD on home 02 and severe pulmonary HTN (WHO group 3) who presented to the [facility] ED for evaluation of dizziness and near syncope.   This occurred in the setting of low O2 supplementation due to low O2 in his portable tank.   He has chronic lung disease as well as advanced CHF.   Near syncope He is clear that he became acutely ill and fatigued when his O2 talk was low, leading to his collapse.   He is at risk for arrhythmias/ sudden death however he is not a candidate for ICD or advanced  CV procedures. No further inpatient workup planned  Cor Pulmonale/acute on chronic systolic heart failure -Pt with hx of IPF with right heart failure and COPD, O2 dependent -Most recent echo- EF 35-40%, grade 1 DD, PA peak pressure: 56 mm Hg (S). Continue home diuretic dosing He has follow-up next week with Dr Tamala Julian  PAF -Maintaining sinus rhythm  -CHA2DS2-VASc Score 6 (CHF, HTN, age, vasc dz, CVA (2)). Coumadin therapy for stroke risk reduction. managed by pharmacy Has scheduled INR outpatient follow-up  CKD -Recent baseline SCr around 2  Interstitial lung disease -Followed by Dr. Chase Caller. On pirfenidone and home O2. Not a candidate for pulmonary vasodilators.  Will need close outpatient follow-up  CAD -3 vessel moderate-moderately severe disease, treating medically -Continue aspirin and statin. No BB due to severe lung disease. Follow-up with Dr Tamala Julian in the office   Anticipate discharge today Very little room to improve in the hospital Consider palliative discussions  Will need to follow closely with Dr Lynford Citizen in the office.  Scheduled already to see Dr Tamala Julian next  week.  Very sick patient.  A high level of decision making was required for this encounter   Thompson Grayer, MD 09/25/2016 11:33 AM

## 2016-09-25 NOTE — Progress Notes (Signed)
ANTICOAGULATION CONSULT NOTE - Follow Up Consult  Pharmacy Consult for Warfarin  Indication: atrial fibrillation  No Known Allergies  Patient Measurements: Height: _0  (177.8 cm) Weight: 186 lb 3.2 oz (84.5 kg) (Scale A) IBW/kg (Calculated) : 73  Vital Signs: Temp: 98.2 F (36.8 C) (03/18 0607) Temp Source: Oral (03/18 0607) BP: 107/77 (03/18 0607) Pulse Rate: 74 (03/18 0607)  Labs:  Recent Labs  09/22/16 2007 09/23/16 0603 09/24/16 0455 09/24/16 0904 09/25/16 0459  HGB 15.4  --  13.5  --   --   HCT 45.7  --  40.8  --   --   PLT 125*  --  121*  --   --   LABPROT 29.6*  --   --  27.3* 29.7*  INR 2.74  --   --  2.48 2.76  CREATININE 2.45* 2.13* 1.92*  --  1.79*   Estimated Creatinine Clearance: 37.4 mL/min (A) (by C-G formula based on SCr of 1.79 mg/dL (H)).  Medical History: Past Medical History:  Diagnosis Date  . A-fib (HCC)    paroxysmal  . CHF (congestive heart failure) ([place])   . COPD (chronic obstructive pulmonary disease) ([place])   . Hypertension   . Pulmonary fibrosis ([place])   . Shortness of breath dyspnea   . 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 INR remain therapeutic today at 2.76. CBC stable yesterday,  no new labs today. No s/sx of bleeding noted.    PTA warfarin dosing: 2.5 mg Sun, 5 mg all other days  Goal of Therapy:  INR 2-3 Monitor platelets by anticoagulation protocol: Yes   Plan:  -Cont warfarin home dosing with 2.5 mg tonight -Daily PT/INR -Monitor CBC, s/sx of bleeding  Rober Minion, PharmD., MS Clinical Pharmacist Pager:  [phone] Thank you for allowing pharmacy to be part of this patients care team. 09/25/2016 1:12 PM

## 2016-09-25 NOTE — Evaluation (Signed)
Physical Therapy Evaluation Patient Details Name: Patrick Weiss MRN: [id number] DOB: 10/23/1940 Today's Date: 09/25/2016   History of Present Illness  Patrick Weiss is a 76 y.o. 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 Patient apparently had 2 syncopal episodes, was found to be hypoxic on presentation. Also noted to be hypotensive and received fluid boluses.  Patient with acute on chronic resp failure with hypoxia.  Clinical Impression  Patient is functioning at Mod I to Independent level with mobility and gait.  Educated patient to ambulate more slowly to minimize dyspnea.  Encouraged him to stop/rest when he begins to get SOB rather than continue until he is too SOB. Educated patient on step-to pattern to negotiate stairs.  No further needs identified - PT will sign off.    Follow Up Recommendations No PT follow up;Supervision - Intermittent    Equipment Recommendations  None recommended by PT    Recommendations for Other Services       Precautions / Restrictions Precautions Precautions: None Precaution Comments: On O2 Restrictions Weight Bearing Restrictions: No      Mobility  Bed Mobility               General bed mobility comments: Patient in chair as PT entered room  Transfers Overall transfer level: Independent Equipment used: None                Ambulation/Gait Ambulation/Gait assistance: Modified independent (Device/Increase time) Ambulation Distance (Feet): 68 Feet Assistive device: None Gait Pattern/deviations: Step-through pattern;Decreased stride length Gait velocity: decreased - appropriately Gait velocity interpretation: Below normal speed for age/gender General Gait Details: Patient with good gait pattern and balance.  Encouraged slow gait speed to minimize DOE.  No loss of balance during gait.  Encouraged pursed-lip breathing during gait.  At 23', began having dyspnea.  Encouraged patient to stop/rest at this level of dyspnea rather than becoming to SOB to continue.  Stairs Stairs:  (Instructed patient on step-to technique to conserve energy.)          Wheelchair Mobility    Modified Rankin (Stroke Patients Only)       Balance Overall balance assessment: No apparent balance deficits (not formally assessed)                                           Pertinent Vitals/Pain Pain Assessment: No/denies pain    Home Living Family/patient expects to be discharged to:: Private residence Living Arrangements: Spouse/significant other Available Help at Discharge: Family;Available 24 hours/day Type of Home: House Home Access: Stairs to enter Entrance Stairs-Rails: Psychiatric nurse of Steps: 5 Home Layout: Two level;Able to live on main level with bedroom/bathroom Home Equipment: Shower seat;Grab bars - tub/shower      Prior Function Level of Independence: Independent               Hand Dominance        Extremity/Trunk Assessment   Upper Extremity Assessment Upper Extremity Assessment: Overall WFL for tasks assessed    Lower Extremity Assessment Lower Extremity Assessment: Overall WFL for tasks assessed;RLE deficits/detail RLE Deficits / Details: RLE 3/4" shorter than LLE       Communication   Communication: No difficulties  Cognition  Arousal/Alertness: Awake/alert Behavior During Therapy: WFL for tasks assessed/performed Overall Cognitive Status: Within Functional Limits for tasks assessed                      General Comments      Exercises     Assessment/Plan    PT Assessment Patent does not need any further PT services  PT Problem List         PT Treatment Interventions      PT Goals (Current goals can be found in the Care Plan section)  Acute Rehab PT Goals Patient Stated Goal: To return  home PT Goal Formulation: All assessment and education complete, DC therapy    Frequency     Barriers to discharge        Co-evaluation               End of Session Equipment Utilized During Treatment: Oxygen Activity Tolerance: Patient tolerated treatment well;Patient limited by fatigue Patient left: in chair;with call bell/phone within reach;with family/visitor present Nurse Communication: Mobility status (No f/u PT needs or equipment needs) PT Visit Diagnosis: Muscle weakness (generalized) (M62.81);Difficulty in walking, not elsewhere classified (R26.2)         Time: 1203-1218 PT Time Calculation (min) (ACUTE ONLY): 15 min   Charges:   PT Evaluation $PT Eval Moderate Complexity: 1 Procedure     PT G Codes:         Despina Pole September 27, 2016, 12:46 PM Carita Pian. Sanjuana Kava, [place] Pager [phone]

## 2016-09-25 NOTE — Progress Notes (Signed)
Pt ambulated in hallway on 4LSpo2 decreased to 84%.

## 2016-09-26 ENCOUNTER — Telehealth: Payer: Self-pay | Admitting: Internal Medicine

## 2016-09-26 NOTE — Telephone Encounter (Signed)
lmtcb x1 for pt. 

## 2016-09-27 NOTE — Telephone Encounter (Signed)
Probably should come and see TP or SG next 10-15 days  Dr. Brand Males, M.D., [facility].C.P Pulmonary and Critical Care Medicine Staff Physician [place] [facility] Pager: [phone], If no answer or between  15:00h - 7:00h: call [phone]  09/27/2016 4:35 PM

## 2016-09-27 NOTE — Telephone Encounter (Signed)
Follow-up recommendations Follow-up with PCP in 3-5 days , including all  additional recommended appointments as below Follow-up CBC, CMP in 3-5 days Patient to follow-up with pulmonary Dr Lynford Citizen , cardiology Next PT/INR check on 3/20 Recommend palliative care services at home Valsartan discontinued due to low BP     MR please advise if you would like to have patient seen before 10-28-16 for PFT and OV with you; was discharged from hospital yesterday 09-26-16. Pt understands to follow up with his PCP within 3-5 days of discharge for follow labwork as well.    Thanks.

## 2016-09-27 NOTE — Telephone Encounter (Signed)
Pt has been scheduled to see SG on 10-12-16 at 3:15 pm for HFU. Nothing more needed at this time.

## 2016-09-28 ENCOUNTER — Other Ambulatory Visit: Payer: Medicare Other

## 2016-09-28 ENCOUNTER — Telehealth: Payer: Self-pay | Admitting: Internal Medicine

## 2016-09-28 DIAGNOSIS — J84112 Idiopathic pulmonary fibrosis: Secondary | ICD-10-CM

## 2016-09-28 DIAGNOSIS — Z006 Encounter for examination for normal comparison and control in clinical research program: Secondary | ICD-10-CM

## 2016-09-28 NOTE — Telephone Encounter (Signed)
Pt has been rescheduled for 10-13-16 at 11:00 Nothing further needed.

## 2016-09-28 NOTE — Progress Notes (Signed)
Clinical Research Coordinator / Research RN note : This visit for Subject Patrick Weiss with 12-10-1940 on 09/28/2016 for the above protocol is Visit/Encounter # 12 Month Follow Up and is for purpose of questionnaire completion and blood draw for central lab . Subject expressed continued interest and consent in continuing as a study subject. Subject thanked for participation.  Patient and spouse present today for scheduled 12 month follow up visit for the IPF-PRO Registry study.  Patient has completed questionnaires by self prior to completion of protocol required blood work.  Patient medications reviewed, see sheet in paper research chart.    Patient and Spouse redundantly thanked for their ongoing participation in the study and patience with delays in today's visit.  Coordinator Barton Bing, CMA to contact patient to schedule 18 month follow up visit.  [place] Bing to also complete data entry for visit.  Signed by Doreatha Martin, RN, BSN, [place] Nurse II [facility] Office [phone] 3:18 PM 09/28/2016

## 2016-09-29 ENCOUNTER — Ambulatory Visit (INDEPENDENT_AMBULATORY_CARE_PROVIDER_SITE_OTHER): Payer: Medicare Other | Admitting: Interventional Cardiology

## 2016-09-29 ENCOUNTER — Ambulatory Visit (INDEPENDENT_AMBULATORY_CARE_PROVIDER_SITE_OTHER): Payer: Medicare Other | Admitting: *Deleted

## 2016-09-29 ENCOUNTER — Encounter: Payer: Self-pay | Admitting: Interventional Cardiology

## 2016-09-29 VITALS — BP 106/70 | HR 81 | Ht 70.0 in | Wt 182.1 lb

## 2016-09-29 DIAGNOSIS — I480 Paroxysmal atrial fibrillation: Secondary | ICD-10-CM

## 2016-09-29 DIAGNOSIS — R55 Syncope and collapse: Secondary | ICD-10-CM | POA: Diagnosis not present

## 2016-09-29 DIAGNOSIS — Z7901 Long term (current) use of anticoagulants: Secondary | ICD-10-CM | POA: Diagnosis not present

## 2016-09-29 DIAGNOSIS — I2781 Cor pulmonale (chronic): Secondary | ICD-10-CM | POA: Diagnosis not present

## 2016-09-29 DIAGNOSIS — I4891 Unspecified atrial fibrillation: Secondary | ICD-10-CM | POA: Diagnosis not present

## 2016-09-29 DIAGNOSIS — I5032 Chronic diastolic (congestive) heart failure: Secondary | ICD-10-CM

## 2016-09-29 DIAGNOSIS — I2510 Atherosclerotic heart disease of native coronary artery without angina pectoris: Secondary | ICD-10-CM | POA: Diagnosis not present

## 2016-09-29 DIAGNOSIS — I6350 Cerebral infarction due to unspecified occlusion or stenosis of unspecified cerebral artery: Secondary | ICD-10-CM | POA: Diagnosis not present

## 2016-09-29 DIAGNOSIS — N184 Chronic kidney disease, stage 4 (severe): Secondary | ICD-10-CM | POA: Diagnosis not present

## 2016-09-29 LAB — CBC
Hematocrit: 47.1 % (ref 37.5–51.0)
Hemoglobin: 15.7 g/dL (ref 13.0–17.7)
MCH: 30.3 pg (ref 26.6–33.0)
MCHC: 33.3 g/dL (ref 31.5–35.7)
MCV: 91 fL (ref 79–97)
PLATELETS: 139 10*3/uL — AB (ref 150–379)
RBC: 5.18 x10E6/uL (ref 4.14–5.80)
RDW: 19.4 % — ABNORMAL HIGH (ref 12.3–15.4)
WBC: 5 10*3/uL (ref 3.4–10.8)

## 2016-09-29 LAB — COMPREHENSIVE METABOLIC PANEL
A/G RATIO: 1.3 (ref 1.2–2.2)
ALBUMIN: 4.3 g/dL (ref 3.5–4.8)
ALT: 18 IU/L (ref 0–44)
AST: 27 IU/L (ref 0–40)
Alkaline Phosphatase: 88 IU/L (ref 39–117)
BUN / CREAT RATIO: 23 (ref 10–24)
BUN: 47 mg/dL — ABNORMAL HIGH (ref 8–27)
Bilirubin Total: 1.4 mg/dL — ABNORMAL HIGH (ref 0.0–1.2)
CALCIUM: 10.3 mg/dL — AB (ref 8.6–10.2)
CO2: 23 mmol/L (ref 18–29)
CREATININE: 2.01 mg/dL — AB (ref 0.76–1.27)
Chloride: 102 mmol/L (ref 96–106)
GFR calc Af Amer: 37 mL/min/{1.73_m2} — ABNORMAL LOW (ref >59)
GFR, EST NON AFRICAN AMERICAN: 32 mL/min/{1.73_m2} — AB (ref >59)
GLOBULIN, TOTAL: 3.2 g/dL (ref 1.5–4.5)
Glucose: 99 mg/dL (ref 65–99)
POTASSIUM: 5.5 mmol/L — AB (ref 3.5–5.2)
SODIUM: 145 mmol/L — AB (ref 134–144)
Total Protein: 7.5 g/dL (ref 6.0–8.5)

## 2016-09-29 LAB — POCT INR: INR: 2.3

## 2016-09-29 NOTE — Patient Instructions (Signed)
Medication Instructions:  None  Labwork: CMET and CBC today  Testing/Procedures: None  Follow-Up: Your physician recommends that you schedule a follow-up appointment in: 8 weeks with Dr. Tamala Julian.    Any Other Special Instructions Will Be Listed Below (If Applicable).     If you need a refill on your cardiac medications before your next appointment, please call your pharmacy.

## 2016-09-29 NOTE — Progress Notes (Signed)
Cardiology Office Note    Date:  09/29/2016   ID:  Patrick Weiss, DOB 1941/07/07, MRN [id number]  PCP:  Foye Spurling, MD  Cardiologist: Sinclair Grooms, MD   Chief Complaint  Patient presents with  . Congestive Heart Failure    Right heart failure    History of Present Illness:  Patrick Weiss is a 76 y.o. 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 Medication regimen was significantly simplified. Hospice and palliative care have been added to the care team. He is DO NOT RESUSCITATE.   Overall feels better than during recent hospital stay. Hospital stay was due to syncope. He was volume overloaded. He was over diuresed. Acute on chronic kidney injury was noted. Valsartan was discontinued. He is stable and current diuretic regimen. He has not had recurrent syncope. He is on high flow oxygen 24/7. He is accompanied by his wife.  Past Medical History:  Diagnosis Date  . A-fib (HCC)    paroxysmal  . CHF (congestive heart failure) ([place])   . COPD (chronic obstructive pulmonary disease) ([place])   . Hypertension   . Pulmonary fibrosis ([place])   . Shortness of breath dyspnea   . 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 BACK SURGERY  2001  . CARDIAC CATHETERIZATION N/A 04/28/2015   Procedure: Right/Left Heart Cath and Coronary Angiography;  Surgeon: Belva Crome, MD;  Location: [facility];  Service: Cardiovascular;  Laterality: N/A;  . CARDIAC CATHETERIZATION N/A 03/16/2016   Procedure: Right Heart Cath;  Surgeon: Belva Crome, MD;  Location: [facility];  Service: Cardiovascular;  Laterality: N/A;  . FEMUR FRACTURE SURGERY    . TIBIA  FRACTURE SURGERY  1975    Current Medications: Outpatient Medications Prior to Visit  Medication Sig Dispense Refill  . Aclidinium Bromide 400 MCG/ACT AEPB Inhale 1 puff into the lungs 2 (two) times daily. 1 each 11  . allopurinol (ZYLOPRIM) 100 MG tablet Take 100 mg by mouth daily.    Marland Kitchen atorvastatin (LIPITOR) 10 MG tablet TAKE 1 TABLET(10 MG) BY MOUTH DAILY 90 tablet 1  . Calcium Carbonate-Vitamin D (CALCIUM 600+D) 600-200 MG-UNIT TABS Take 1 tablet by mouth 2 (two) times daily.    . Carboxymethylcellulose Sodium (REFRESH TEARS OP) Place 1 drop into both eyes 3 (three) times daily as needed (for dry eyes).     Marland Kitchen docusate sodium (COLACE) 100 MG capsule Take 100 mg by mouth daily as needed for mild constipation.    . fexofenadine (ALLEGRA) 180 MG tablet Take 180 mg by mouth daily.     . fluticasone (FLOVENT HFA) 44 MCG/ACT inhaler Inhale 2 puffs into the lungs 2 (two) times daily. 10.6 g 5  . furosemide (LASIX) 80 MG tablet Take 1 tablet (80 mg total) by mouth 2 (two) times daily. 180 tablet 1  . OXYGEN Inhale 3-4 L into the lungs continuous.    . Pirfenidone 267 MG CAPS Take 2 capsules by mouth 3 (three) times daily.     Marland Kitchen spironolactone (ALDACTONE) 25 MG tablet Take 1 tablet (25 mg total) by mouth daily. [place]  tablet 11  . warfarin (COUMADIN) 5 MG tablet TAKE AS DIRECTED (Patient taking differently: Take 7.5 mg by mouth in the morning on Sun and 5 mg on Mon/Tues/Wed/Thurs/Fri/Sat) 120 tablet 1   No facility-administered medications prior to visit.      Allergies:   Patient has no known allergies.   Social History   Social History  . Marital status: Married    Spouse name: N/A  . Number of children: N/A  . Years of education: N/A   Occupational History  . retired    Social History Main Topics  . Smoking status: Former Smoker    Packs/day: 1.00    Years: 30.00    Types: Cigarettes    Quit date: 09/09/1991  . Smokeless tobacco: Never Used  . Alcohol use 0.0 oz/week     Comment:  glass of wine with meals  . Drug use: No  . Sexual activity: Yes    Partners: Female   Other Topics Concern  . None   Social History Narrative  . None     Family History:  The patient's family history includes Cancer in his father.   ROS:   Please see the history of present illness.    Unexplained weight gain. Appetite is poor. No fever or chills.  All other systems reviewed and are negative.   PHYSICAL EXAM:   VS:  BP 106/70 (BP Location: Right Arm)   Pulse 81   Ht _0  (1.778 m)   Wt 182 lb 1.9 oz (82.6 kg)   BMI 26.13 kg/m    GEN: Well nourished, well developed, in no acute distress  HEENT: normal  Neck: Marked JVD with prominent Y descent. No thyroid enlargement or masses. Cardiac: RRR; no murmurs, rubs, or gallops. 1-2+ bilateral ankle edema. Upper leg and thigh edema has resolved. Respiratory:  clear to auscultation bilaterally, normal work of breathing GI: soft, nontender, nondistended, + BS MS: no deformity or atrophy  Skin: warm and dry, no rash Neuro:  Alert and Oriented x 3, Strength and sensation are intact Psych: euthymic mood, full affect  Wt Readings from Last 3 Encounters:  09/29/16 182 lb 1.9 oz (82.6 kg)  09/25/16 186 lb 3.2 oz (84.5 kg)  08/25/16 198 lb (89.8 kg)      Studies/Labs Reviewed:   EKG:  EKG  Not repeated  Recent Labs: 04/21/2016: NT-Pro BNP 4,540 09/22/2016: B Natriuretic Peptide 1,520.5 09/24/2016: ALT 16; Hemoglobin 13.5; Platelets 121 09/25/2016: BUN 44; Creatinine, Ser 1.79; Potassium 4.5; Sodium 137   Lipid Panel    Component Value Date/Time   CHOL 114 (L) 06/15/2015 0828   TRIG 61 06/15/2015 0828   HDL 41 06/15/2015 0828   CHOLHDL 2.8 06/15/2015 0828   VLDL 12 06/15/2015 0828   LDLCALC 61 06/15/2015 0828    Additional studies/ records that were reviewed today include:  Echocardiogram 08/08/16: Study Conclusions  - Left ventricle: The cavity size was normal. Wall thickness was   normal. Systolic function was  moderately reduced. The estimated   ejection fraction was in the range of 35% to 40%. Diffuse   hypokinesis. Doppler parameters are consistent with abnormal left   ventricular relaxation (grade 1 diastolic dysfunction). - Ventricular septum: The contour showed diastolic flattening and   systolic flattening. - Aortic valve: There was trivial regurgitation. - Right ventricle: The cavity size was severely dilated. Systolic   function was severely reduced. - Right atrium: The atrium was massively dilated. - Atrial septum: The septum bowed from  right to left, consistent   with increased right atrial pressure. - Tricuspid valve: There was wide-open regurgitation. - Pulmonary arteries: PA peak pressure: 56 mm Hg (S). - Pericardium, extracardiac: A trivial pericardial effusion was   identified.  Impressions:  - Moderate global reduction in LV systolic function; grade 1   diastolic dysfunction; D shaped septum; trace AI; severe RAE and   RVE; severely reduced RV function; wide open TR; findings c/w   severe pulmonary hypertension.   ASSESSMENT:    1. Chronic diastolic heart failure, NYHA class 3 ([place])   2. Cor pulmonale, chronic ([place])   3. Near syncope   4. AF (paroxysmal atrial fibrillation) ([place])   5. Coronary artery disease involving native coronary artery of native heart without angina pectoris   6. CKD (chronic kidney disease), stage IV (HCC)      PLAN:  In order of problems listed above:  1. No obvious evidence of pulmonary congestion although cellophane-like rales are heard consistent with pulmonary fibrosis. Also neck veins are markedly elevated but this is obviously related to known severe right heart failure. 2. Chronic O2 therapy and anticoagulated. This is an end-stage process. No new management strategies. Continue aggressive but careful diuretic therapy for comfort. The patient has engaged and accepted hospice and palliative care. He is now a DO NOT RESUSCITATE. 3. No  recurrence of near syncope. We have to be careful with volume removal. 4. Not currently an issue. 5. Asymptomatic. 6. Significant impairment. Very volume sensitive. Continue to monitor. Basic metabolic panel was obtained today. Further adjustments in medical regimen may be necessary depending upon findings.  Overall, the patient's prognosis is very poor. He is now DO NOT RESUSCITATE/hospice palliative care. Both he and the wife acknowledge this. Clinical follow-up in 6-8 weeks. Blood work is obtained today.  Medication Adjustments/Labs and Tests Ordered: Current medicines are reviewed at length with the patient today.  Concerns regarding medicines are outlined above.  Medication changes, Labs and Tests ordered today are listed in the Patient Instructions below. Patient Instructions  Medication Instructions:  None  Labwork: CMET and CBC today  Testing/Procedures: None  Follow-Up: Your physician recommends that you schedule a follow-up appointment in: 8 weeks with Dr. Tamala Julian.    Any Other Special Instructions Will Be Listed Below (If Applicable).     If you need a refill on your cardiac medications before your next appointment, please call your pharmacy.      Signed, Sinclair Grooms, MD  09/29/2016 12:14 PM    [facility] [address] [place], [place], [place]  [address] Phone: [phone]; Fax: [phone]

## 2016-09-30 ENCOUNTER — Telehealth: Payer: Self-pay | Admitting: Interventional Cardiology

## 2016-09-30 DIAGNOSIS — E875 Hyperkalemia: Secondary | ICD-10-CM

## 2016-09-30 NOTE — Telephone Encounter (Signed)
Spoke with pt and went over lab results and recommendations per Dr. Tamala Julian.  Pt verbalized understanding and was in agreement with this plan. Pt will come Monday for BMET.

## 2016-09-30 NOTE — Telephone Encounter (Signed)
Follow Up:   Returning your call,concerning his lab results.

## 2016-09-30 NOTE — Telephone Encounter (Signed)
Please see phone message from 3.9.2018. Will sign off.

## 2016-10-03 ENCOUNTER — Telehealth: Payer: Self-pay | Admitting: Pulmonary Disease

## 2016-10-03 ENCOUNTER — Other Ambulatory Visit: Payer: Medicare Other | Admitting: *Deleted

## 2016-10-03 DIAGNOSIS — E875 Hyperkalemia: Secondary | ICD-10-CM

## 2016-10-03 NOTE — Telephone Encounter (Signed)
Spoke with pt, had questions about his inhalers which have since been clarified.  Nothing further needed.

## 2016-10-04 LAB — BASIC METABOLIC PANEL
BUN/Creatinine Ratio: 21 (ref 10–24)
BUN: 41 mg/dL — AB (ref 8–27)
CHLORIDE: 98 mmol/L (ref 96–106)
CO2: 26 mmol/L (ref 18–29)
Calcium: 10.1 mg/dL (ref 8.6–10.2)
Creatinine, Ser: 1.92 mg/dL — ABNORMAL HIGH (ref 0.76–1.27)
GFR calc Af Amer: 39 mL/min/{1.73_m2} — ABNORMAL LOW (ref >59)
GFR, EST NON AFRICAN AMERICAN: 34 mL/min/{1.73_m2} — AB (ref >59)
Glucose: 130 mg/dL — ABNORMAL HIGH (ref 65–99)
POTASSIUM: 4.8 mmol/L (ref 3.5–5.2)
Sodium: 144 mmol/L (ref 134–144)

## 2016-10-12 ENCOUNTER — Inpatient Hospital Stay: Payer: Medicare Other | Admitting: Acute Care

## 2016-10-13 ENCOUNTER — Encounter: Payer: Self-pay | Admitting: Acute Care

## 2016-10-13 ENCOUNTER — Ambulatory Visit (INDEPENDENT_AMBULATORY_CARE_PROVIDER_SITE_OTHER): Payer: Medicare Other | Admitting: Acute Care

## 2016-10-13 DIAGNOSIS — J84112 Idiopathic pulmonary fibrosis: Secondary | ICD-10-CM

## 2016-10-13 DIAGNOSIS — J430 Unilateral pulmonary emphysema [MacLeod's syndrome]: Secondary | ICD-10-CM

## 2016-10-13 DIAGNOSIS — I6350 Cerebral infarction due to unspecified occlusion or stenosis of unspecified cerebral artery: Secondary | ICD-10-CM

## 2016-10-13 DIAGNOSIS — R634 Abnormal weight loss: Secondary | ICD-10-CM | POA: Diagnosis not present

## 2016-10-13 DIAGNOSIS — J9611 Chronic respiratory failure with hypoxia: Secondary | ICD-10-CM | POA: Diagnosis not present

## 2016-10-13 DIAGNOSIS — Z006 Encounter for examination for normal comparison and control in clinical research program: Secondary | ICD-10-CM

## 2016-10-13 NOTE — Assessment & Plan Note (Signed)
Remember to wear your oxygen 4L at rest and 5L with exertion. Remember we want your saturations greater than 85% at all times. Continue your Esbriet 2 tablets 3 times a day.. Follow up with Dr. Chase Caller 10/28/2016 as is already scheduled. Remember PFT's prior to 10/28/16 appointment. Please contact office for sooner follow up if symptoms do not improve or worsen or seek emergency care

## 2016-10-13 NOTE — Assessment & Plan Note (Signed)
Remember to wear your oxygen 4L at rest and 5L with exertion. Remember we want your saturations greater than 88% at all times. Continue your Flovent twice daily. Continue your Caprice Renshaw twice daily. Follow up with Dr. Chase Caller 10/28/2016 as is already scheduled. Remember PFT's prior to 10/28/16 appointment. Please contact office for sooner follow up if symptoms do not improve or worsen or seek emergency care

## 2016-10-13 NOTE — Patient Instructions (Addendum)
It is nice to meet you today. Happy Birthday!!! Remember to wear your oxygen 4L at rest and 5L with exertion. Remember we want your saturations greater than 85% at all times. Continue your Flovent twice daily. Continue your Caprice Renshaw twice daily. Continue your Esbriet 2 tablets 3 times a day. Consider Boost or Ensure as a meal supplement, once or twice daily. Follow up with Dr. Chase Caller 10/28/2016 as is already scheduled. Remember PFT's prior to 10/28/16 appointment. Please contact office for sooner follow up if symptoms do not improve or worsen or seek emergency care

## 2016-10-13 NOTE — Assessment & Plan Note (Signed)
Monitor for weight loss Consider meal supplements once tor twice daily ( Boost, Ensure) Follow up with Dr. Guadalupe Dawn 4/20 Please contact office for sooner follow up if symptoms do not improve or worsen or seek emergency care

## 2016-10-13 NOTE — Progress Notes (Signed)
History of Present Illness Patrick Weiss is a 76 y.o. 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 This is associated with cor pulmonale and also chronic systolic heart failure ejection fraction 45% in February 2016. He is on anti-fibrotic therapy Esbriet since approximately November 2016.He is followed by Dr. Chase Caller.   10/13/2016 Hospital Follow Up: Pt. Presents today for hospital follow up. He was hospitalized 09/22/2016 through 09/25/2016 for multi factorial acute on chronic respiratory failure. He did not have dyspnea , sputum production fever or cough, CXR did not indicate pneumonia or COPD  Or IPF/COPD flare.He was treated with diuretics, oxygen . He presented to the office today with saturations in the low 80's. He was only wearing  his oxygen at 2 L. His need is 4-5 L.He is worried about running out of oxygen.We have encouraged him to ask for office tanks while he is here, so he does not have to decrease his flow.He states he is doing well since discharge. He is using the down stairs bedroom at home and has a home oxygen concentrator.He states he uses 5 L oxygen with activity and 4L at rest..He is using his [place] with good results. Cost for drug is only $ 25.00, which he states is good for him.His lasix dose was decreased per cards. He is currently taking 80 mg. Twice daily. He is taking his Esbriet , 2 tablets 3 times daily. He states he is not having any GI issues. Last labs were  09/29/2016.He has a follow up appointment 4/20 with PFT's. He will need CMET for LFT check with that appointment. We did discuss weight loss, and the need to possibly supplement with boost or Ensure between meals.   Tests Echo 08/08/2016 Impressions:  - Moderate global reduction in LV systolic function; grade 1   diastolic dysfunction; D shaped septum; trace AI; severe RAE and   RVE; severely reduced RV function;  wide open TR; findings c/w   severe pulmonary hypertension. EF: 35-40%   Past medical hx Past Medical History:  Diagnosis Date  . A-fib (HCC)    paroxysmal  . CHF (congestive heart failure) ([place])   . COPD (chronic obstructive pulmonary disease) ([place])   . Hypertension   . Pulmonary fibrosis ([place])   . Shortness of breath dyspnea   . Stroke 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  Current Outpatient Prescriptions on File Prior to Visit  Medication Sig  . Aclidinium Bromide 400 MCG/ACT AEPB Inhale 1 puff into the lungs 2 (two) times daily.  Marland Kitchen allopurinol (ZYLOPRIM) 100 MG tablet Take 100 mg by mouth daily.  Marland Kitchen atorvastatin (LIPITOR) 10 MG tablet TAKE 1 TABLET(10 MG) BY MOUTH DAILY  . Calcium Carbonate-Vitamin D (CALCIUM 600+D) 600-200 MG-UNIT TABS Take 1 tablet by mouth 2 (two) times daily.  . Carboxymethylcellulose Sodium (REFRESH TEARS OP) Place 1 drop into both eyes 3 (three) times daily as needed (for dry eyes).   Marland Kitchen docusate sodium (COLACE) 100 MG capsule Take 100 mg by mouth daily as needed for mild constipation.  . fexofenadine (ALLEGRA) 180 MG tablet Take 180 mg by mouth daily.   . fluticasone (FLOVENT HFA) 44 MCG/ACT inhaler Inhale 2 puffs into the lungs 2 (two) times daily.  . furosemide (LASIX) 80 MG tablet Take 1 tablet (80  mg total) by mouth 2 (two) times daily.  Marland Kitchen OVER THE COUNTER MEDICATION Take 2 tablets by mouth daily. [facility] brand of pain reliever  . OXYGEN Inhale 3-4 L into the lungs continuous.  . Pirfenidone 267 MG CAPS Take 2 capsules by mouth 3 (three) times daily.   Marland Kitchen warfarin (COUMADIN) 5 MG tablet TAKE AS DIRECTED (Patient taking differently: Take 7.5 mg by mouth in the morning on Sun and 5 mg on Mon/Tues/Wed/Thurs/Fri/Sat)   No current facility-administered medications on file prior to visit.      No Known Allergies  Review Of  Systems:  Constitutional:   +  weight loss,No  night sweats,  Fevers, chills, fatigue, or  lassitude.  HEENT:   No headaches,  Difficulty swallowing,  Tooth/dental problems, or  Sore throat,                No sneezing, itching, ear ache, nasal congestion, post nasal drip,   CV:  No chest pain,  Orthopnea, PND, swelling in lower extremities, anasarca, dizziness, palpitations, syncope.   GI  No heartburn, indigestion, abdominal pain, nausea, vomiting, diarrhea, change in bowel habits, loss of appetite, bloody stools.   Resp: + shortness of breath with exertion or at rest.  No excess mucus, no productive cough,  No non-productive cough,  No coughing up of blood.  No change in color of mucus.  No wheezing.  No chest wall deformity  Skin: no rash or lesions.  GU: no dysuria, change in color of urine, no urgency or frequency.  No flank pain, no hematuria   MS:  No joint pain or swelling.  No decreased range of motion.  No back pain.  Psych:  No change in mood or affect. No depression or anxiety.  No memory loss.   Vital Signs BP 120/70 (BP Location: Left Arm, Cuff Size: Normal)   Pulse 75   Ht _0  (1.753 m)   Wt 189 lb (85.7 kg)   SpO2 95%   BMI 27.91 kg/m    Physical Exam:  General- No distress,  A&Ox3, elderly frail male on nasal cannula oxygen. ENT: No sinus tenderness, TM clear, pale nasal mucosa, no oral exudate,no post nasal drip, no LAN Cardiac: S1, S2, regular rate and rhythm, no murmur Chest: No wheeze/ rales/ dullness; no accessory muscle use, no nasal flaring, no sternal retractions, crackles noted throughout. Abd.: Soft Non-tender Ext: No clubbing cyanosis, trace edema, wearing compression stockings Neuro:  Alert and oriented 3, moving all extremities 4, deconditioned at baseline. Skin: No rashes, warm and dry Psych: normal mood and behavior, very pleasant   Assessment/Plan  Chronic hypoxemic respiratory failure (HCC) Remember to wear your oxygen 4L at rest  and 5L with exertion. Remember we want your saturations greater than 88% at all times. Follow up with Dr. Chase Caller 10/28/2016 as is already scheduled. Remember PFT's prior to 10/28/16 appointment. Please contact office for sooner follow up if symptoms do not improve or worsen or seek emergency care    COPD (chronic obstructive pulmonary disease) ([place]) Remember to wear your oxygen 4L at rest and 5L with exertion. Remember we want your saturations greater than 88% at all times. Continue your Flovent twice daily. Continue your Caprice Renshaw twice daily. Follow up with Dr. Chase Caller 10/28/2016 as is already scheduled. Remember PFT's prior to 10/28/16 appointment. Please contact office for sooner follow up if symptoms do not improve or worsen or seek emergency care    IPF (idiopathic pulmonary fibrosis) ([place]) Remember to wear  your oxygen 4L at rest and 5L with exertion. Remember we want your saturations greater than 85% at all times. Continue your Esbriet 2 tablets 3 times a day.. Follow up with Dr. Chase Caller 10/28/2016 as is already scheduled. Remember PFT's prior to 10/28/16 appointment. Please contact office for sooner follow up if symptoms do not improve or worsen or seek emergency care    Loss of weight Monitor for weight loss Consider meal supplements once tor twice daily ( Boost, Ensure) Follow up with Dr. Guadalupe Dawn 4/20 Please contact office for sooner follow up if symptoms do not improve or worsen or seek emergency care     Magdalen Spatz, NP 10/13/2016  4:44 PM

## 2016-10-13 NOTE — Assessment & Plan Note (Signed)
Remember to wear your oxygen 4L at rest and 5L with exertion. Remember we want your saturations greater than 88% at all times. Follow up with Dr. Chase Caller 10/28/2016 as is already scheduled. Remember PFT's prior to 10/28/16 appointment. Please contact office for sooner follow up if symptoms do not improve or worsen or seek emergency care

## 2016-10-13 NOTE — Progress Notes (Signed)
Clinical Research Coordinator / Research RN note :  Subject [id number] was in clinic today for a routine visit with nurse practitioner  in [facility]. While he was in the office this coordinator had met with the patient to have him re-consent to the IPF Pro registry due to an amended ICF. The ICF version that was signed was version 08 Mar 2015, revised 01 Jul 2016. A copy of the signed consent was provided to the subject. The original is filed in the subjects paper study binder with the informed consent documentation checklist.   Subject thanked for participation in research and contribution to science.    Signed by,  Boswell Bing, CMA,  [place] Coordinator  [facility]  [place], [place] 5:40 PM 10/13/2016

## 2016-10-19 NOTE — Telephone Encounter (Signed)
Called and spoke to pt. Pt states he received his Esbriet last week. Pt states he had a $35 copay. Pt aware to contact our office if there are any more issues with receiving his Esbriet. Pt verbalized understanding and denied any further questions or concerns at this time.

## 2016-10-20 ENCOUNTER — Ambulatory Visit (INDEPENDENT_AMBULATORY_CARE_PROVIDER_SITE_OTHER): Payer: Medicare Other | Admitting: Pharmacist

## 2016-10-20 DIAGNOSIS — I4891 Unspecified atrial fibrillation: Secondary | ICD-10-CM | POA: Diagnosis not present

## 2016-10-20 DIAGNOSIS — I480 Paroxysmal atrial fibrillation: Secondary | ICD-10-CM | POA: Diagnosis not present

## 2016-10-20 DIAGNOSIS — I6350 Cerebral infarction due to unspecified occlusion or stenosis of unspecified cerebral artery: Secondary | ICD-10-CM

## 2016-10-20 DIAGNOSIS — Z7901 Long term (current) use of anticoagulants: Secondary | ICD-10-CM | POA: Diagnosis not present

## 2016-10-20 LAB — POCT INR: INR: 2.7

## 2016-10-28 ENCOUNTER — Other Ambulatory Visit (INDEPENDENT_AMBULATORY_CARE_PROVIDER_SITE_OTHER): Payer: Medicare Other

## 2016-10-28 ENCOUNTER — Encounter: Payer: Self-pay | Admitting: Internal Medicine

## 2016-10-28 ENCOUNTER — Ambulatory Visit (INDEPENDENT_AMBULATORY_CARE_PROVIDER_SITE_OTHER): Payer: Medicare Other | Admitting: Internal Medicine

## 2016-10-28 VITALS — BP 116/78 | HR 92 | Ht 69.0 in | Wt 188.2 lb

## 2016-10-28 DIAGNOSIS — J9611 Chronic respiratory failure with hypoxia: Secondary | ICD-10-CM

## 2016-10-28 DIAGNOSIS — J849 Interstitial pulmonary disease, unspecified: Secondary | ICD-10-CM

## 2016-10-28 DIAGNOSIS — J84112 Idiopathic pulmonary fibrosis: Secondary | ICD-10-CM

## 2016-10-28 DIAGNOSIS — J439 Emphysema, unspecified: Secondary | ICD-10-CM | POA: Diagnosis not present

## 2016-10-28 DIAGNOSIS — I6350 Cerebral infarction due to unspecified occlusion or stenosis of unspecified cerebral artery: Secondary | ICD-10-CM

## 2016-10-28 DIAGNOSIS — Z5181 Encounter for therapeutic drug level monitoring: Secondary | ICD-10-CM | POA: Diagnosis not present

## 2016-10-28 LAB — PULMONARY FUNCTION TEST
DL/VA % pred: 23 %
DL/VA: 1.09 ml/min/mmHg/L
DLCO UNC: 4.36 ml/min/mmHg
DLCO cor % pred: 13 %
DLCO cor: 4.18 ml/min/mmHg
DLCO unc % pred: 13 %
FEF 25-75 PRE: 0.82 L/s
FEF2575-%PRED-PRE: 36 %
FEV1-%PRED-PRE: 64 %
FEV1-PRE: 1.76 L
FEV1FVC-%Pred-Pre: 84 %
FEV6-%Pred-Pre: 76 %
FEV6-PRE: 2.67 L
FEV6FVC-%PRED-PRE: 102 %
FVC-%Pred-Pre: 74 %
FVC-PRE: 2.74 L
PRE FEV1/FVC RATIO: 64 %
Pre FEV6/FVC Ratio: 97 %

## 2016-10-28 LAB — HEPATIC FUNCTION PANEL
ALBUMIN: 4.2 g/dL (ref 3.5–5.2)
ALT: 15 U/L (ref 0–53)
AST: 19 U/L (ref 0–37)
Alkaline Phosphatase: 76 U/L (ref 39–117)
Bilirubin, Direct: 0.6 mg/dL — ABNORMAL HIGH (ref 0.0–0.3)
Total Bilirubin: 1.4 mg/dL — ABNORMAL HIGH (ref 0.2–1.2)
Total Protein: 7.6 g/dL (ref 6.0–8.3)

## 2016-10-28 LAB — BASIC METABOLIC PANEL
BUN: 39 mg/dL — AB (ref 6–23)
CO2: 30 mEq/L (ref 19–32)
Calcium: 10.1 mg/dL (ref 8.4–10.5)
Chloride: 104 mEq/L (ref 96–112)
Creatinine, Ser: 1.75 mg/dL — ABNORMAL HIGH (ref 0.40–1.50)
GFR: 49.11 mL/min — AB (ref >60.00)
GLUCOSE: 78 mg/dL (ref 70–99)
Potassium: 4.6 mEq/L (ref 3.5–5.1)
Sodium: 142 mEq/L (ref 135–145)

## 2016-10-28 NOTE — Assessment & Plan Note (Signed)
Continue high flow o2 Continue supportive palliative care at home

## 2016-10-28 NOTE — Assessment & Plan Note (Signed)
contionue increase and flovent

## 2016-10-28 NOTE — Assessment & Plan Note (Addendum)
Slowly progressive disease  Plan coontinue esbriet whichI think is working modestly Appreciate participaton in [employer] registry study

## 2016-10-28 NOTE — Assessment & Plan Note (Signed)
Check lft - if worse, will have to stop esbreit Check bmet - if kidney worse, will have to reduce esbriet

## 2016-10-28 NOTE — Patient Instructions (Signed)
Encounter for therapeutic drug monitoring Check lft - if worse, will have to stop esbreit Check bmet - if kidney worse, will have to reduce esbriet  IPF (idiopathic pulmonary fibrosis) ([place]) Slowly progressive disease  Plan coontinue esbriet whichI think is working modestly Appreciate participaton in [employer] registry study  Chronic hypoxemic respiratory failure ([place]) Continue high flow o2 Continue supportive palliative care at home  Emphysema of lung ([place]) contionue increase and flovent   Fopllowup 3 months or sooner fi needed

## 2016-10-28 NOTE — Progress Notes (Signed)
Subjective:     Patient ID: Patrick Weiss, male   DOB: 1941-06-27, 76 y.o.   MRN: [id number]  HPI    OV 09/07/2015  Chief Complaint  Patient presents with  . Follow-up    Pt does not feel that Esbriet is helping much. Pt uses 2.5-4Liters O2 per/min.    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 This is associated with cor pulmonale and also chronic systolic heart failure ejection fraction 45% in February 2016. He is on anti-fibrotic therapy Esbriet since approximately November 2016.   He presents for follow-up with his wife. Overall he feels that the drug might not be helping him. He understands that the anti-fibrotic therapies for prevention and is not necessarily designed to make him feel better. Wife reports that he had a low appetite initially after starting the drug but since then his appetite is picked back up to normal. However he is having fatigue that is significantly worse after starting the drug. It is moderate in intensity. His oxygen level uses around the same. He is also reporting significant bilateral pedal edema for which she has seen Dr. Pernell Dupre. Most recent chemistries reveal a BNP of 750 and a creatinine of 1.62 mg percent. The creatinine is slightly higher than December 2016 when it was only 1.5 mg percent. His last liver function test was in December 2016 and it was normal. He is due for repeat liver function test today. Off note, he is not ideal candidate for Ofev due to anticoagulation  He is also reporting changes in his sleep pattern and is wondering if it's because of IPF anti-fibrotic therapy   OV 12/03/2015  Chief Complaint  Patient presents with  . Follow-up    Pt c/o continued SOB. He is on 1L O2 with POC when ambulating and 4L O2 via POC when at home. Pt's O2 in room was 67 on POC @ 1L. Pt also c/o cough with mostly clear and occasional wheeze/chest tightness.  Pt denies CP.      Follow-up idiopathic pulmonary fibrosis on Pirfenidone (Esbriet) 2 tablets 3 times a day  He had issues with higher dose of Pirfenidone (Esbriet) but he is tolerating the lower dose 2 tablets 3 times daily without problems. He did get admitted in April 2017 with congestive heart failure according to his history. He did have a chest x-ray with that I reviewed the results. He also had blood work that showed mild renal insufficiency but normal liver function tests. I visualize these results myself. At this point in time he feels overall he is stable. He is trying to get into an exercise program at [facility]. He is more sedentary. He is using 1 L of oxygen at rest with 4 L at exertion. Today at rest on room air for 20 minutes his pulse ox of 82%. When he walks from the waiting area to our office room on 1 L oxygen he dropped a 67%. He says his most recent admission he had significant pedal edema and he was diuresed. He still has significant pedal edema.f note he is wondering why he should apply sunscreen with IPF. He says that he never told him that. Apparently the booklets from [place] tell him that.   OV 02/24/2016  Chief Complaint  Patient presents with  . Follow-up    Pt states his breathing is unchanged since last OV. Pt c/o prod cough with clear to yellow mucus in morning.  FU IPF/chronic hypoxemic resp failure - on Pirfenidone (Esbriet) 2 tablets 3 times a day. He had lung function test today. His FVC is 2.99 L in approximately 5% on compared to one year ago. His DLCO is 5.77/17% and is approximately 25% on compared to one year ago. He does not feel his difference. He uses between 4 and 5 L oxygen and gets around and does all his activities of daily living is fine. He has worsening pedal edema. He saw a dermatologist yesterday and has some skin excoriation in his shin. His dermatologist as advised him that this is not due to Pirfenidone (Esbriet). In review of his oxygenation 4L  [place] at rest but  5 min after rooming in 85% -> after stoppoing talking -> 90% and I think this is worse from before  Labs shows rising creat 1.46 in may 2017 -> 1.46m% July 2017. He is noted to be on Lasix and Aldactone  Echocardiogram February 2016 shows elevated pulmonary artery systolic pressure of 60 mmHg  Off note he is being followed as a research but this event in the IPF-pro Registry noninterventional trial (only intervention being questionnaire and lab draws]. This is standard of care visit'  Also of note is noticed to be on Flovent and Spiriva. Unclear to me why. Last visit with Dr. MLegrand Comowert he was asked to stop both inhalers but he says the Flovent helps him. The Spiriva does not and therefore he is requesting this to be stopped      OV 08/25/2016  Chief Complaint  Patient presents with  . Follow-up    Pt states his SOB is unchanged since last OV. Pt c/o prod cough with yellow mucus in morning.    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 He see neurology and the retinal specialist. He is out of his Pirfenidone ([employer] because of co-pay issues and the [employer] denying charity coverage. He has not applied to [employer] program to get his esbriet. He has been off esbriet since jan 2018. He is reporting (as below) a slow decline in effort toleratnce.GEts dizzey with desaturations. RN noticed pulse ox 70s on rooming him in on 3 LNC     OV 10/28/2016  Chief Complaint  Patient presents with  . Follow-up    2 mon f/u for PFT results. States he has been feeling well since last visit.     Follow-up severe cor pulmonale pulmonary artery systolic pressure 70 on right heart catheterization in 2017 associated with IPF and emphysema  He presents  with his other daughter [name] Since his last visit he admission for respiratory failure issues. Currently he is back to baseline using 4 L of oxygen at rest and 5 L with exertion although he does desaturate with this. Nevertheless he still able to self-care including taking a shower. He denies any acute complaints. He has follow-up appointment pending with Dr. HPernell Duprein cardiology and Dr. SLeonie Manneurology. He has not had any syncopal spells.lab review shows that his creatinine might be worsening. In addition liver function test shows that his bilirubin might be worsening although still within somewhat of a normal range. He is on  low dosePirfenidone (Esbriet). In addition his COPD inhalers.   Results for Salido, Santhiago A (MRN  [id number]) as of 10/28/2016 11:46  Ref. Range 08/03/2016 09:13 08/08/2016 15:35 08/15/2016 09:15 08/29/2016 10:24 09/13/2016 10:58 09/22/2016 20:07 09/22/2016 20:22 09/22/2016 20:43 09/23/2016 01:06 09/23/2016 06:03 09/24/2016 04:55 09/24/2016 09:04 09/25/2016 04:59 09/29/2016 10:49 09/29/2016 10:58 10/03/2016 11:03 10/20/2016 10:50  AST Latest Ref Range: 0 - 40 IU/L           21    27    ALT Latest Ref Range: 0 - 44 IU/L           16 (L)    18    Total Protein Latest Ref Range: 6.5 - 8.1 g/dL           6.1 (L)        Total Bilirubin Latest Ref Range: 0.0 - 1.2 mg/dL           1.2    1.4 (H)      Results for Cozzens, Abu A (MRN [id number]) as of 10/28/2016 11:46  Ref. Range 09/25/2016 04:59 09/29/2016 10:49 09/29/2016 10:58 10/03/2016 11:03 10/20/2016 10:50  Hemoglobin Latest Ref Range: 13.0 - 17.7 g/dL   15.7      Results for Swango, Oziah A (MRN [id number]) as of 10/28/2016 11:46  Ref. Range 09/25/2016 04:59 09/29/2016 10:49 09/29/2016 10:58 10/03/2016 11:03 10/20/2016 10:50  Creatinine Latest Ref Range: 0.76 - 1.27 mg/dL 1.79 (H)  2.01 (H) 1.92 (H)     Results for Doody, KHALIFA KNECHT (MRN [id number]) as of 10/28/2016 11:46  Ref. Range 09/09/2014 10:30 03/11/2015 14:00 02/24/2016 08:32 10/28/2016 10:37   FVC-Pre Latest Units: L 3.08 3.16 2.99 2.74  FVC-%Pred-Pre Latest Units: % 81 83 79 74   Results for Reetz, Tri A (MRN [id number]) as of 10/28/2016 11:46  Ref. Range 09/09/2014 10:30 03/11/2015 14:00 02/24/2016 08:32 10/28/2016 10:37  DLCO unc Latest Units: ml/min/mmHg 9.46 8.44 6.20 4.36  DLCO unc % pred Latest Units: % _0 has a past medical history of A-fib ([place]); CHF (congestive heart failure) ([place]); COPD (chronic obstructive pulmonary disease) ([place]); Hypertension; Pulmonary fibrosis ([place]); Shortness of breath dyspnea; and Stroke ([place]).   reports that he quit smoking about 25 years ago. His smoking use included Cigarettes. He has a 30.00 pack-year smoking history. He has never used smokeless tobacco.  Past Surgical History:  Procedure Laterality Date  . BACK SURGERY  2001  . CARDIAC CATHETERIZATION N/A 04/28/2015   Procedure: Right/Left Heart Cath and Coronary Angiography;  Surgeon: Belva Crome, MD;  Location: [facility];  Service: Cardiovascular;  Laterality: N/A;  . CARDIAC CATHETERIZATION N/A 03/16/2016   Procedure: Right Heart Cath;  Surgeon: Belva Crome, MD;  Location: [facility];  Service: Cardiovascular;  Laterality: N/A;  . FEMUR FRACTURE SURGERY    . TIBIA FRACTURE SURGERY  1975    No Known Allergies  Immunization History  Administered Date(s) Administered  . Influenza Split 03/11/2014, 03/08/2016  . Influenza,inj,Quad PF,36+ Mos 03/16/2015    Family History  Problem Relation Age of Onset  . Cancer Father      Current Outpatient Prescriptions:  .  Aclidinium Bromide 400 MCG/ACT AEPB, Inhale 1 puff into the lungs 2 (two) times daily., Disp: 1 each, Rfl: 11 .  allopurinol (ZYLOPRIM) 100 MG tablet, Take 100 mg by mouth daily., Disp: , Rfl:  .  atorvastatin (LIPITOR) 10 MG tablet, TAKE 1 TABLET(10 MG) BY MOUTH DAILY, Disp: 90 tablet, Rfl: 1 .  Calcium Carbonate-Vitamin D (CALCIUM 600+D) 600-200 MG-UNIT TABS, Take 1 tablet by  mouth 2 (two)  times daily., Disp: , Rfl:  .  Carboxymethylcellulose Sodium (REFRESH TEARS OP), Place 1 drop into both eyes 3 (three) times daily as needed (for dry eyes). , Disp: , Rfl:  .  docusate sodium (COLACE) 100 MG capsule, Take 100 mg by mouth daily as needed for mild constipation., Disp: , Rfl:  .  fexofenadine (ALLEGRA) 180 MG tablet, Take 180 mg by mouth daily. , Disp: , Rfl:  .  fluticasone (FLOVENT HFA) 44 MCG/ACT inhaler, Inhale 2 puffs into the lungs 2 (two) times daily., Disp: 10.6 g, Rfl: 5 .  furosemide (LASIX) 80 MG tablet, Take 1 tablet (80 mg total) by mouth 2 (two) times daily., Disp: 180 tablet, Rfl: 1 .  OVER THE COUNTER MEDICATION, Take 2 tablets by mouth daily. [facility] brand of pain reliever, Disp: , Rfl:  .  OXYGEN, Inhale 3-4 L into the lungs continuous., Disp: , Rfl:  .  Pirfenidone 267 MG CAPS, Take 2 capsules by mouth 3 (three) times daily. , Disp: , Rfl:  .  umeclidinium bromide (INCRUSE ELLIPTA) 62.5 MCG/INH AEPB, Inhale 1 puff into the lungs daily., Disp: , Rfl:  .  warfarin (COUMADIN) 5 MG tablet, TAKE AS DIRECTED (Patient taking differently: Take 7.5 mg by mouth in the morning on Sun and 5 mg on Mon/Tues/Wed/Thurs/Fri/Sat), Disp: 120 tablet, Rfl: 1   Review of Systems     Objective:   Physical Exam  Constitutional: He is oriented to person, place, and time. He appears well-developed and well-nourished. No distress.  HENT:  Head: Normocephalic and atraumatic.  Right Ear: External ear normal.  Left Ear: External ear normal.  Mouth/Throat: Oropharynx is clear and moist. No oropharyngeal exudate.  Sunglasses o2 on  Eyes: Conjunctivae and EOM are normal. Pupils are equal, round, and reactive to light. Right eye exhibits no discharge. Left eye exhibits no discharge. No scleral icterus.  Neck: Normal range of motion. Neck supple. No JVD present. No tracheal deviation present. No thyromegaly present.  Cardiovascular: Normal rate, regular rhythm and intact distal pulses.   Exam reveals no gallop and no friction rub.   No murmur heard. Pulmonary/Chest: Effort normal. No respiratory distress. He has no wheezes. He has rales. He exhibits no tenderness.  Abdominal: Soft. Bowel sounds are normal. He exhibits no distension and no mass. There is no tenderness. There is no rebound and no guarding.  Musculoskeletal: Normal range of motion. He exhibits edema. He exhibits no tenderness.  clubbing  Lymphadenopathy:    He has no cervical adenopathy.  Neurological: He is alert and oriented to person, place, and time. He has normal reflexes. No cranial nerve deficit. Coordination normal.  Skin: Skin is warm and dry. No rash noted. He is not diaphoretic. No erythema. No pallor.  Psychiatric: He has a normal mood and affect. His behavior is normal. Judgment and thought content normal.  Nursing note and vitals reviewed.   Vitals:   10/28/16 1135  BP: 116/78  Pulse: 92  SpO2: (!) 86%  Weight: 188 lb 3.2 oz (85.4 kg)  Height: _0  (1.753 m)   Estimated body mass index is 27.79 kg/m as calculated from the following:   Height as of this encounter: _1  (1.753 m).   Weight as of this encounter: 188 lb 3.2 oz (85.4 kg).      Assessment:       ICD-9-CM ICD-10-CM   1. Chronic hypoxemic respiratory failure (HCC) 518.83 J96.11    799.02    2.  IPF (idiopathic pulmonary fibrosis) (HCC) 516.31 R56.153 Basic metabolic panel     Hepatic function panel  3. Encounter for therapeutic drug monitoring V58.83 Z51.81   4. Pulmonary emphysema, unspecified emphysema type ([place]) 492.8 J43.9        Plan:     Encounter for therapeutic drug monitoring Check lft - if worse, will have to stop esbreit Check bmet - if kidney worse, will have to reduce esbriet  IPF (idiopathic pulmonary fibrosis) ([place]) Slowly progressive disease  Plan coontinue esbriet whichI think is working modestly Appreciate participaton in [employer] registry study  Chronic hypoxemic respiratory failure  ([place]) Continue high flow o2 Continue supportive palliative care at home  Emphysema of lung ([place]) contionue increase and flovent  Fu 3 months or sooner if needed   Dr. Brand Males, M.D., [facility].P Pulmonary and Critical Care Medicine Staff Physician [place] [facility] Pager: [phone], If no answer or between  15:00h - 7:00h: call [phone]  10/28/2016 11:59 AM

## 2016-10-31 ENCOUNTER — Encounter: Payer: Self-pay | Admitting: Neurology

## 2016-10-31 ENCOUNTER — Ambulatory Visit (INDEPENDENT_AMBULATORY_CARE_PROVIDER_SITE_OTHER): Payer: Medicare Other | Admitting: Neurology

## 2016-10-31 VITALS — BP 107/76 | HR 87 | Wt 192.0 lb

## 2016-10-31 DIAGNOSIS — R55 Syncope and collapse: Secondary | ICD-10-CM | POA: Diagnosis not present

## 2016-10-31 DIAGNOSIS — I6350 Cerebral infarction due to unspecified occlusion or stenosis of unspecified cerebral artery: Secondary | ICD-10-CM

## 2016-10-31 NOTE — Patient Instructions (Addendum)
I had a long discussion with the patient and his wife regarding his episode of syncope, carotid occlusion, atrial fibrillation and stroke risk and answered questions. I recommend he continue warfarin for stroke prevention and maintain strict control of blood pressure with goal below 130/90. Strict control of lipids with LDL cholesterol goal below 70 mg percent. We also discussed fall and safety prevention precautions. No routine scheduled follow-up appointment with me is necessary but he may be referred back by his primary physician in the future as needed.  Fall Prevention in the Home Falls can cause injuries and can affect people from all age groups. There are many simple things that you can do to make your home safe and to help prevent falls. What can I do on the outside of my home?  Regularly repair the edges of walkways and driveways and fix any cracks.  Remove high doorway thresholds.  Trim any shrubbery on the main path into your home.  Use bright outdoor lighting.  Clear walkways of debris and clutter, including tools and rocks.  Regularly check that handrails are securely fastened and in good repair. Both sides of any steps should have handrails.  Install guardrails along the edges of any raised decks or porches.  Have leaves, snow, and ice cleared regularly.  Use sand or salt on walkways during winter months.  In the garage, clean up any spills right away, including grease or oil spills. What can I do in the bathroom?  Use night lights.  Install grab bars by the toilet and in the tub and shower. Do not use towel bars as grab bars.  Use non-skid mats or decals on the floor of the tub or shower.  If you need to sit down while you are in the shower, use a plastic, non-slip stool.  Keep the floor dry. Immediately clean up any water that spills on the floor.  Remove soap buildup in the tub or shower on a regular basis.  Attach bath mats securely with double-sided non-slip  rug tape.  Remove throw rugs and other tripping hazards from the floor. What can I do in the bedroom?  Use night lights.  Make sure that a bedside light is easy to reach.  Do not use oversized bedding that drapes onto the floor.  Have a firm chair that has side arms to use for getting dressed.  Remove throw rugs and other tripping hazards from the floor. What can I do in the kitchen?  Clean up any spills right away.  Avoid walking on wet floors.  Place frequently used items in easy-to-reach places.  If you need to reach for something above you, use a sturdy step stool that has a grab bar.  Keep electrical cables out of the way.  Do not use floor polish or wax that makes floors slippery. If you have to use wax, make sure that it is non-skid floor wax.  Remove throw rugs and other tripping hazards from the floor. What can I do in the stairways?  Do not leave any items on the stairs.  Make sure that there are handrails on both sides of the stairs. Fix handrails that are broken or loose. Make sure that handrails are as long as the stairways.  Check any carpeting to make sure that it is firmly attached to the stairs. Fix any carpet that is loose or worn.  Avoid having throw rugs at the top or bottom of stairways, or secure the rugs with carpet tape to  prevent them from moving.  Make sure that you have a light switch at the top of the stairs and the bottom of the stairs. If you do not have them, have them installed. What are some other fall prevention tips?  Wear closed-toe shoes that fit well and support your feet. Wear shoes that have rubber soles or low heels.  When you use a stepladder, make sure that it is completely opened and that the sides are firmly locked. Have someone hold the ladder while you are using it. Do not climb a closed stepladder.  Add color or contrast paint or tape to grab bars and handrails in your home. Place contrasting color strips on the first and  last steps.  Use mobility aids as needed, such as canes, walkers, scooters, and crutches.  Turn on lights if it is dark. Replace any light bulbs that burn out.  Set up furniture so that there are clear paths. Keep the furniture in the same spot.  Fix any uneven floor surfaces.  Choose a carpet design that does not hide the edge of steps of a stairway.  Be aware of any and all pets.  Review your medicines with your healthcare provider. Some medicines can cause dizziness or changes in blood pressure, which increase your risk of falling. Talk with your health care provider about other ways that you can decrease your risk of falls. This may include working with a physical therapist or trainer to improve your strength, balance, and endurance. This information is not intended to replace advice given to you by your health care provider. Make sure you discuss any questions you have with your health care provider. Document Released: 06/17/2002 Document Revised: 11/24/2015 Document Reviewed: 08/01/2014 Elsevier Interactive Patient Education  2017 [employer].

## 2016-10-31 NOTE — Progress Notes (Addendum)
[facility] [address] [place]. [place] [address] [phone]       OFFICE FOLLOW UP VISIT NOTE  Patrick. Patrick Weiss Date of Birth:  01-20-1941 Medical Record Number:  [id number]    Referring MD:  Jeanann Lewandowsky Reason for Referral:  Dizziness HPI: Initial consult 08/25/2016 Patrick Weiss is a 76 year male who in January 2018 had an episode when he was getting out of his bathtub all of a sudden he felt dizzy and fell down and hit his left forehand and IA. He had subconjunctival hemorrhage and loss of vision in that eye. Patient is not sure whether he passed out or not. Patient denied any chest pain palpitations or sweating. He was able to get up with some help. He denied focal extremity weakness numbness, slurred speech or vertigo. Patient is currently seeing ophthalmologist Dr. neck but tail retina specialist but his vision in the left eye has not come back significantly. He is only able to see from the peripheral part of his left eye but cannot see centrally and towards thehe is referred to me for evaluation for possible TIA or stroke causing him to fall. 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 He was cared for at [facility]. He was subsequently found to have atrial fibrillation as well and has been on warfarin. He had a CT scan of the head done on 07/12/16 which I have personally reviewed shows an old right medial frontal and small right cerebellar infarct. 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 No significant stenosis noted on the left side. The patient remains on warfarin which is tolerating well otherwise without bruising or bleeding. 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 Chase Caller. He has chronic back problems and right leg pain and has  trouble walking because of that. Is currently wearing a eye patch on the left I to help him focus better and see if it is right eye. Update 10/31/2016 : He returns for followup after last visit 2 months ago. He is doing well without any neurovascular symptoms or syncopal episodes or falling. He has noticed some improvement in his vision in the left eye. He can see some shadows in the bright sun. He no longer needs to wear an eye patch. He is on warfarin for atrial fibrillation and tolerating it well without bleeding or bruising. He had MRI scan of the brain do on 08/2716 in and we I have personally reonic right caroti god and right vertebral occlus and andionsviewed and shows a Chronic right anterior frontal ischemic infarction  With  Multiple left parietal and left temporal chronic cerebral microhemorrhages on SWI views.MRA brain shows chronic right carotid and vertebral artery occlusions with MRA brain showing diminished flow signal in intracranial right ICA and MCA from collaterals. ROS:   14 system review of systems is positive for  Chills, loss of vision, cough, wheezing, shortness of breath, cold intolerance, excessive thirst, swollen abdomen, day time sleepiness, snoring, a frequency of urination, joint pain, aching muscles, walking difficulty, neck pain, weakness, depression, anxiety and all the systems negative  PMH:  Past Medical History:  Diagnosis Date  . A-fib (HCC)    paroxysmal  . CHF (congestive heart failure) ([place])   . COPD (chronic obstructive pulmonary disease) ([place])   . Hypertension   .  Pulmonary fibrosis ([place])   . Shortness of breath dyspnea   . 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 Marital status: Married    Spouse name: N/A  . Number of children: N/A  . Years of education: N/A   Occupational History  . retired    Social History Main Topics  .  Smoking status: Former Smoker    Packs/day: 1.00    Years: 30.00    Types: Cigarettes    Quit date: 09/09/1991  . Smokeless tobacco: Never Used  . Alcohol use No     Comment: glass of wine with meals  . Drug use: No  . Sexual activity: Yes    Partners: Female   Other Topics Concern  . Not on file   Social History Narrative  . No narrative on file    Medications:   Current Outpatient Prescriptions on File Prior to Visit  Medication Sig Dispense Refill  . Aclidinium Bromide 400 MCG/ACT AEPB Inhale 1 puff into the lungs 2 (two) times daily. 1 each 11  . allopurinol (ZYLOPRIM) 100 MG tablet Take 100 mg by mouth daily.    Marland Kitchen atorvastatin (LIPITOR) 10 MG tablet TAKE 1 TABLET(10 MG) BY MOUTH DAILY 90 tablet 1  . Calcium Carbonate-Vitamin D (CALCIUM 600+D) 600-200 MG-UNIT TABS Take 1 tablet by mouth 2 (two) times daily.    . Carboxymethylcellulose Sodium (REFRESH TEARS OP) Place 1 drop into both eyes 3 (three) times daily as needed (for dry eyes).     Marland Kitchen docusate sodium (COLACE) 100 MG capsule Take 100 mg by mouth daily as needed for mild constipation.    . fexofenadine (ALLEGRA) 180 MG tablet Take 180 mg by mouth daily.     . fluticasone (FLOVENT HFA) 44 MCG/ACT inhaler Inhale 2 puffs into the lungs 2 (two) times daily. 10.6 g 5  . furosemide (LASIX) 80 MG tablet Take 1 tablet (80 mg total) by mouth 2 (two) times daily. 180 tablet 1  . OVER THE COUNTER MEDICATION Take 2 tablets by mouth daily. [facility] brand of pain reliever    . OXYGEN Inhale 3-4 L into the lungs continuous.    . Pirfenidone 267 MG CAPS Take 2 capsules by mouth 3 (three) times daily.     Marland Kitchen warfarin (COUMADIN) 5 MG tablet TAKE AS DIRECTED (Patient taking differently: Take 7.5 mg by mouth in the morning on Sun and 5 mg on Mon/Tues/Wed/Thurs/Fri/Sat) 120 tablet 1   No current facility-administered medications on file prior to visit.     Allergies:  No Known Allergies  Physical Exam General: well developed, well  nourished elderly male, seated, in no evident distress.Patient is on home oxygen Head: head normocephalic and atraumatic.   Neck: supple with no carotid or supraclavicular bruits. Right carotid upstroke is diminished compared to left Cardiovascular: regular rate and rhythm, soft ejection  murmur Musculoskeletal: no deformity Skin:  no rash/petichiae Vascular:  Normal pulses all extremities  Neurologic Exam Mental Status: Awake and fully alert. Oriented to place and time. Recent and remote memory intact. Attention span, concentration and fund of knowledge appropriate. Mood and affect appropriate.  Cranial Nerves: Fundoscopic exam not done.. Pupils unequal, with right  briskly reactive to light. And left  unreactive Extraocular movements full without nystagmus. Visual fields full to confrontation in right eye. Left eye vision acuity is significantly  reduced  with patient able to see in the temporal field only but not looking straight or nasally.Marland Kitchen Hearing intact. Facial sensation intact. Face, tongue, palate moves normally and symmetrically.  Motor: Normal bulk and tone. Normal strength in all tested extremity muscles. Sensory.: intact to touch , pinprick , position and vibratory sensation.  Coordination: Rapid alternating movements normal in all extremities. Finger-to-nose and heel-to-shin performed accurately bilaterally. Gait and Station: Arises from chair without difficulty. Stance is normal. Gait demonstrates normal stride length and balance . Able to heel, toe and tandem walk with slight difficulty.  Reflexes: 1+ and symmetric. Toes downgoing.   NIHSS  1 Modified Rankin  2  ASSESSMENT: 76 year old African-American male with transient episode of dizziness resulting in a fall with traumatic injury to the left eye with vision loss in the setting of known right carotid and vertebral artery occlusion and prior stroke. Exact etiology of fall uncertain but likely a  presyncopal event.    PLAN: I  had a long discussion with the patient and his wife regarding his episode of syncope, carotid occlusion, atrial fibrillation and stroke risk and answered questions. I recommend he continue warfarin for stroke prevention and maintain strict control of blood pressure with goal below 130/90. Strict control of lipids with LDL cholesterol goal below 70 mg percent. We also discussed fall and safety prevention precautions. No routine scheduled follow-up appointment with me is necessary but he may be referred back by his primary physician in the future as needed Greater than 50% time during this 25 minute  visit was spent on counseling and coordination of care about his carotid and vertebral occlusion, stroke and TIA risk, plan for evaluation, treatment and answering questions who   Antony Contras, MD [facility] [address] [place], [place] [address]  Phone [phone] Fax [phone] Note: This document was prepared with digital dictation and possible smart phrase technology. Any transcriptional errors that result from this process are unintentional.

## 2016-11-01 ENCOUNTER — Telehealth: Payer: Self-pay | Admitting: Internal Medicine

## 2016-11-01 DIAGNOSIS — R7989 Other specified abnormal findings of blood chemistry: Secondary | ICD-10-CM

## 2016-11-01 DIAGNOSIS — Z5181 Encounter for therapeutic drug level monitoring: Secondary | ICD-10-CM

## 2016-11-01 DIAGNOSIS — R945 Abnormal results of liver function studies: Secondary | ICD-10-CM

## 2016-11-01 NOTE — Telephone Encounter (Signed)
  Called and spoke with pt and he is requesting his lab results.  MR please advise on these results.  Thanks  No Known Allergies

## 2016-11-04 NOTE — Telephone Encounter (Signed)
Results for Patrick Weiss, Patrick Weiss (MRN [id number]) as of 11/04/2016 12:19  Ref. Range 05/08/2015 09:55 06/08/2015 14:05 06/15/2015 08:28 06/25/2015 08:44 09/03/2015 08:42 09/07/2015 17:17 11/02/2015 12:46 11/02/2015 12:50 11/03/2015 03:42 11/04/2015 03:54 12/03/2015 16:01 12/17/2015 09:04 01/14/2016 09:01 02/24/2016 11:06 03/09/2016 08:47 06/14/2016 09:28 06/17/2016 10:45 06/22/2016 08:32 06/30/2016 09:29 07/12/2016 13:18 09/22/2016 20:07 09/22/2016 20:22 09/22/2016 20:43 09/23/2016 06:03 09/24/2016 04:55 09/25/2016 04:59 09/29/2016 10:58 10/03/2016 11:03 10/28/2016 12:15  Total Bilirubin Latest Ref Range: 0.2 - 1.2 mg/dL 0.6  0.8   0.8   1.0  1.1   1.3 (H)           1.2  1.4 (H)  1.4 (H)    His biliriubin was normal 2 y ears ago and then8 months ago went up to upper limit of normal/mildy abnormal and then normal but now back upp again. If bilirubin is high, he will have to dc esbriet altogether. But is possible is something else causing high biliribubin  Plan = recheck lft next week of 11/07/16  - check RUQ ultrasound - indication - hyperbilirbunemia  - based on this might have to stop esbriet   Dr. Brand Males, M.D., [facility] Ms.C.P Pulmonary and Critical Care Medicine Staff Physician [place] [facility] Pager: [phone], If no answer or between  15:00h - 7:00h: call [phone]  11/04/2016 12:22 PM

## 2016-11-04 NOTE — Telephone Encounter (Signed)
Spoke with pt, aware of results/recs.  Orders placed.  Nothing further needed at this time.

## 2016-11-09 ENCOUNTER — Other Ambulatory Visit (INDEPENDENT_AMBULATORY_CARE_PROVIDER_SITE_OTHER): Payer: Medicare Other

## 2016-11-09 DIAGNOSIS — Z5181 Encounter for therapeutic drug level monitoring: Secondary | ICD-10-CM

## 2016-11-09 LAB — HEPATIC FUNCTION PANEL
ALK PHOS: 84 U/L (ref 39–117)
ALT: 17 U/L (ref 0–53)
AST: 25 U/L (ref 0–37)
Albumin: 4.2 g/dL (ref 3.5–5.2)
BILIRUBIN DIRECT: 0.4 mg/dL — AB (ref 0.0–0.3)
BILIRUBIN TOTAL: 1.2 mg/dL (ref 0.2–1.2)
TOTAL PROTEIN: 7.7 g/dL (ref 6.0–8.3)

## 2016-11-11 ENCOUNTER — Ambulatory Visit
Admission: RE | Admit: 2016-11-11 | Discharge: 2016-11-11 | Disposition: A | Discharge status: Home / Self Care | Payer: Medicare Other | Source: Ambulatory Visit | Attending: Internal Medicine | Admitting: Internal Medicine

## 2016-11-11 DIAGNOSIS — R7989 Other specified abnormal findings of blood chemistry: Secondary | ICD-10-CM

## 2016-11-11 DIAGNOSIS — R945 Abnormal results of liver function studies: Secondary | ICD-10-CM

## 2016-11-14 NOTE — Progress Notes (Signed)
lmtcb for pt.

## 2016-11-15 ENCOUNTER — Other Ambulatory Visit: Payer: Self-pay

## 2016-11-15 ENCOUNTER — Telehealth: Payer: Self-pay | Admitting: Internal Medicine

## 2016-11-15 ENCOUNTER — Encounter: Payer: Self-pay | Admitting: Gastroenterology

## 2016-11-15 DIAGNOSIS — R188 Other ascites: Secondary | ICD-10-CM

## 2016-11-15 NOTE — Telephone Encounter (Signed)
  Notes recorded by Brand Males, MD on 11/13/2016 at 3:43 PM EDT Bili improved and normal. Ok to continue esbriet. Recheck LFT in 3 months  Lab       11/09/16           1142     AST     25      ALT     17      ALKPHOS   84      BILITOT   1.2      PROT     7.7      ALBUMIN   4.2 --------------------------- Spoke with pt, aware of results/recs.  Nothing further needed.

## 2016-11-23 ENCOUNTER — Ambulatory Visit (INDEPENDENT_AMBULATORY_CARE_PROVIDER_SITE_OTHER): Payer: Medicare Other | Admitting: Internal Medicine

## 2016-11-23 ENCOUNTER — Ambulatory Visit: Payer: Medicare Other | Admitting: Pulmonary Disease

## 2016-11-23 ENCOUNTER — Telehealth: Payer: Self-pay | Admitting: Internal Medicine

## 2016-11-23 ENCOUNTER — Encounter: Payer: Self-pay | Admitting: Internal Medicine

## 2016-11-23 ENCOUNTER — Other Ambulatory Visit (INDEPENDENT_AMBULATORY_CARE_PROVIDER_SITE_OTHER): Payer: Medicare Other

## 2016-11-23 ENCOUNTER — Ambulatory Visit (INDEPENDENT_AMBULATORY_CARE_PROVIDER_SITE_OTHER)
Admission: RE | Admit: 2016-11-23 | Discharge: 2016-11-23 | Disposition: A | Discharge status: Home / Self Care | Payer: Medicare Other | Source: Ambulatory Visit | Attending: Internal Medicine | Admitting: Internal Medicine

## 2016-11-23 DIAGNOSIS — Z5181 Encounter for therapeutic drug level monitoring: Secondary | ICD-10-CM | POA: Diagnosis not present

## 2016-11-23 DIAGNOSIS — J84112 Idiopathic pulmonary fibrosis: Secondary | ICD-10-CM

## 2016-11-23 DIAGNOSIS — J9611 Chronic respiratory failure with hypoxia: Secondary | ICD-10-CM | POA: Diagnosis not present

## 2016-11-23 DIAGNOSIS — R042 Hemoptysis: Secondary | ICD-10-CM | POA: Diagnosis not present

## 2016-11-23 DIAGNOSIS — J439 Emphysema, unspecified: Secondary | ICD-10-CM | POA: Diagnosis not present

## 2016-11-23 DIAGNOSIS — R9389 Abnormal findings on diagnostic imaging of other specified body structures: Secondary | ICD-10-CM

## 2016-11-23 DIAGNOSIS — I6350 Cerebral infarction due to unspecified occlusion or stenosis of unspecified cerebral artery: Secondary | ICD-10-CM

## 2016-11-23 DIAGNOSIS — R0789 Other chest pain: Secondary | ICD-10-CM | POA: Insufficient documentation

## 2016-11-23 LAB — CBC WITH DIFFERENTIAL/PLATELET
Basophils Absolute: 0 10*3/uL (ref 0.0–0.1)
Basophils Relative: 0.5 % (ref 0.0–3.0)
Eosinophils Absolute: 0.3 10*3/uL (ref 0.0–0.7)
Eosinophils Relative: 5.6 % — ABNORMAL HIGH (ref 0.0–5.0)
HEMATOCRIT: 47.3 % (ref 39.0–52.0)
Hemoglobin: 15.2 g/dL (ref 13.0–17.0)
LYMPHS PCT: 16.3 % (ref 12.0–46.0)
Lymphs Abs: 1 10*3/uL (ref 0.7–4.0)
MCHC: 32.2 g/dL (ref 30.0–36.0)
MCV: 90.7 fl (ref 78.0–100.0)
MONOS PCT: 12.8 % — AB (ref 3.0–12.0)
Monocytes Absolute: 0.7 10*3/uL (ref 0.1–1.0)
NEUTROS ABS: 3.8 10*3/uL (ref 1.4–7.7)
Neutrophils Relative %: 64.8 % (ref 43.0–77.0)
Platelets: 159 10*3/uL (ref 150.0–400.0)
RBC: 5.22 Mil/uL (ref 4.22–5.81)
RDW: 19.4 % — AB (ref 11.5–15.5)
WBC: 5.9 10*3/uL (ref 4.0–10.5)

## 2016-11-23 LAB — BASIC METABOLIC PANEL
BUN: 51 mg/dL — AB (ref 6–23)
CO2: 28 mEq/L (ref 19–32)
CREATININE: 2.11 mg/dL — AB (ref 0.40–1.50)
Calcium: 10.3 mg/dL (ref 8.4–10.5)
Chloride: 105 mEq/L (ref 96–112)
GFR: 39.56 mL/min — AB (ref >60.00)
Glucose, Bld: 168 mg/dL — ABNORMAL HIGH (ref 70–99)
Potassium: 4.9 mEq/L (ref 3.5–5.1)
Sodium: 142 mEq/L (ref 135–145)

## 2016-11-23 LAB — HEPATIC FUNCTION PANEL
ALT: 15 U/L (ref 0–53)
AST: 19 U/L (ref 0–37)
Albumin: 4 g/dL (ref 3.5–5.2)
Alkaline Phosphatase: 78 U/L (ref 39–117)
Bilirubin, Direct: 0.9 mg/dL — ABNORMAL HIGH (ref 0.0–0.3)
Total Bilirubin: 2.1 mg/dL — ABNORMAL HIGH (ref 0.2–1.2)
Total Protein: 7.3 g/dL (ref 6.0–8.3)

## 2016-11-23 LAB — PROTIME-INR
INR: 1.8 ratio — AB (ref 0.8–1.0)
Prothrombin Time: 19.4 s — ABNORMAL HIGH (ref 9.6–13.1)

## 2016-11-23 MED ORDER — PREDNISONE 10 MG PO TABS: ORAL_TABLET | ORAL | 0 refills | Status: AC

## 2016-11-23 MED ORDER — DOXYCYCLINE HYCLATE 100 MG PO TABS: ORAL_TABLET | ORAL | 0 refills | Status: AC

## 2016-11-23 NOTE — Patient Instructions (Addendum)
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

## 2016-11-23 NOTE — Telephone Encounter (Signed)
Spoke with pt, verified that he is taking Esbriet 2 tabs TID.    Also relayed cxr results.  Ct ordered.  Pt aware.  Nothing further needed.  Forwarding to MR as FYI for Esbriet dosage.

## 2016-11-23 NOTE — Telephone Encounter (Signed)
Saw patient 11/23/2016 Patrick Weiss  Lbs sill sthow some jaundice and baseline increase in creatinine - so ensure esbriet is only 2 tab tid  Also, CXR showing something abnormal on left side. -> do CT chest wo contrast next few days   Dr. Brand Males, M.D., [facility].C.P Pulmonary and Critical Care Medicine Staff Physician [place] [facility] Pager: [phone], If no answer or between  15:00h - 7:00h: call [phone]  11/23/2016 2:35 PM      LABS PULMONARY No results for input(s): PHART, PCO2ART, PO2ART, HCO3, TCO2, O2SAT in the last 168 hours.  Invalid input(s): PCO2, PO2  CBC  Recent Labs Lab 11/23/16 1213  HGB 15.2  HCT 47.3  WBC 5.9  PLT 159.0    COAGULATION  Recent Labs Lab 11/23/16 1213  INR 1.8*    CARDIAC  No results for input(s): TROPONINI in the last 168 hours. No results for input(s): PROBNP in the last 168 hours.   CHEMISTRY  Recent Labs Lab 11/23/16 1213  NA 142  K 4.9  CL 105  CO2 28  GLUCOSE 168*  BUN 51*  CREATININE 2.11*  CALCIUM 10.3   Estimated Creatinine Clearance: 33.4 mL/min (A) (by C-G formula based on SCr of 2.11 mg/dL (H)).   LIVER  Recent Labs Lab 11/23/16 1213  AST 19  ALT 15  ALKPHOS 78  BILITOT 2.1*  PROT 7.3  ALBUMIN 4.0  INR 1.8*     INFECTIOUS No results for input(s): LATICACIDVEN, PROCALCITON in the last 168 hours.   ENDOCRINE CBG (last 3)  No results for input(s): GLUCAP in the last 72 hours.       IMAGING x48h  - image(s) personally visualized  -   highlighted in bold Dg Chest 2 View  Result Date: 11/23/2016 CLINICAL DATA:  Two days of hemoptysis. History of idiopathic pulmonary fibrosis, COPD, CHF, former smoker. Previous episodes of pneumonia. EXAM: CHEST  2 VIEW COMPARISON:  Portable chest x-ray of September 22, 2016 FINDINGS: The lungs remain well-expanded. There is new increased interstitial density peripherally in the left mid lung. The  interstitial markings at both lung bases have increased. There is mild blunting of the right lateral costophrenic angle. The posterior costophrenic angles remain clear. There did cardiac silhouette is mildly enlarged. The pulmonary vascularity is not engorged. There is calcification in the wall of the aortic arch. IMPRESSION: Worsening of the appearance of the interstitium of the left mid lung laterally. Coarse lung markings at both lung bases are not new but are more conspicuous. This may reflect acute interstitial pneumonia or acute exacerbation of an underlying inflammatory process such as UIP. Cardiomegaly without pulmonary vascular congestion. Thoracic aortic atherosclerosis. Given the hemoptysis, chest CT scanning would be a useful next imaging step. Electronically Signed   By: David  Martinique M.D.   On: 11/23/2016 13:21

## 2016-11-23 NOTE — Assessment & Plan Note (Signed)
chck troponin

## 2016-11-23 NOTE — Assessment & Plan Note (Addendum)
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

## 2016-11-23 NOTE — Progress Notes (Signed)
Subjective:     Patient ID: Patrick Weiss, male   DOB: 1941/04/22, 76 y.o.   MRN: [id number]   HPI     OV 09/07/2015  Chief Complaint  Patient presents with  . Follow-up    Pt does not feel that Esbriet is helping much. Pt uses 2.5-4Liters O2 per/min.    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 This is associated with cor pulmonale and also chronic systolic heart failure ejection fraction 45% in February 2016. He is on anti-fibrotic therapy Esbriet since approximately November 2016.   He presents for follow-up with his wife. Overall he feels that the drug might not be helping him. He understands that the anti-fibrotic therapies for prevention and is not necessarily designed to make him feel better. Wife reports that he had a low appetite initially after starting the drug but since then his appetite is picked back up to normal. However he is having fatigue that is significantly worse after starting the drug. It is moderate in intensity. His oxygen level uses around the same. He is also reporting significant bilateral pedal edema for which she has seen Dr. Pernell Dupre. Most recent chemistries reveal a BNP of 750 and a creatinine of 1.62 mg percent. The creatinine is slightly higher than December 2016 when it was only 1.5 mg percent. His last liver function test was in December 2016 and it was normal. He is due for repeat liver function test today. Off note, he is not ideal candidate for Ofev due to anticoagulation  He is also reporting changes in his sleep pattern and is wondering if it's because of IPF anti-fibrotic therapy   OV 12/03/2015  Chief Complaint  Patient presents with  . Follow-up    Pt c/o continued SOB. He is on 1L O2 with POC when ambulating and 4L O2 via POC when at home. Pt's O2 in room was 67 on POC @ 1L. Pt also c/o cough with mostly clear and occasional wheeze/chest  tightness. Pt denies CP.      Follow-up idiopathic pulmonary fibrosis on Pirfenidone (Esbriet) 2 tablets 3 times a day  He had issues with higher dose of Pirfenidone (Esbriet) but he is tolerating the lower dose 2 tablets 3 times daily without problems. He did get admitted in April 2017 with congestive heart failure according to his history. He did have a chest x-ray with that I reviewed the results. He also had blood work that showed mild renal insufficiency but normal liver function tests. I visualize these results myself. At this point in time he feels overall he is stable. He is trying to get into an exercise program at [facility]. He is more sedentary. He is using 1 L of oxygen at rest with 4 L at exertion. Today at rest on room air for 20 minutes his pulse ox of 82%. When he walks from the waiting area to our office room on 1 L oxygen he dropped a 67%. He says his most recent admission he had significant pedal edema and he was diuresed. He still has significant pedal edema.f note he is wondering why he should apply sunscreen with IPF. He says that he never told him that. Apparently the booklets from [place] tell him that.   OV 02/24/2016  Chief Complaint  Patient presents with  . Follow-up    Pt states his breathing is unchanged since last OV. Pt c/o prod cough with clear to yellow mucus  in morning.    FU IPF/chronic hypoxemic resp failure - on Pirfenidone (Esbriet) 2 tablets 3 times a day. He had lung function test today. His FVC is 2.99 L in approximately 5% on compared to one year ago. His DLCO is 5.77/17% and is approximately 25% on compared to one year ago. He does not feel his difference. He uses between 4 and 5 L oxygen and gets around and does all his activities of daily living is fine. He has worsening pedal edema. He saw a dermatologist yesterday and has some skin excoriation in his shin. His dermatologist as advised him that this is not due to Pirfenidone (Esbriet). In review of his  oxygenation 4L [place] at rest but  5 min after rooming in 85% -> after stoppoing talking -> 90% and I think this is worse from before  Labs shows rising creat 1.46 in may 2017 -> 1.22m% July 2017. He is noted to be on Lasix and Aldactone  Echocardiogram February 2016 shows elevated pulmonary artery systolic pressure of 60 mmHg  Off note he is being followed as a research but this event in the IPF-pro Registry noninterventional trial (only intervention being questionnaire and lab draws]. This is standard of care visit'  Also of note is noticed to be on Flovent and Spiriva. Unclear to me why. Last visit with Dr. MLegrand Comowert he was asked to stop both inhalers but he says the Flovent helps him. The Spiriva does not and therefore he is requesting this to be stopped      OV 08/25/2016  Chief Complaint  Patient presents with  . Follow-up    Pt states his SOB is unchanged since last OV. Pt c/o prod cough with yellow mucus in morning.    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 He see neurology and the retinal specialist. He is out of his Pirfenidone ([employer] because of co-pay issues and the [employer] denying charity coverage. [place] esbriet. He has been off esbriet since jan 2018. He is reporting (as below) a slow decline in effort toleratnce.GEts dizzey with desaturations. RN noticed pulse ox 70s on rooming him in on 3 LNC     OV 10/28/2016  Chief Complaint  Patient presents with  . Follow-up    2 mon f/u for PFT results. States he has been feeling well since last visit.     Follow-up severe cor pulmonale pulmonary artery systolic pressure 70 on right heart catheterization in 2017 associated with IPF and  emphysema  He presents with his other daughter [name] Since his last visit he admission for respiratory failure issues. Currently he is back to baseline using 4 L of oxygen at rest and 5 L with exertion although he does desaturate with this. Nevertheless he still able to self-care including taking a shower. He denies any acute complaints. He has follow-up appointment pending with Dr. HPernell Duprein cardiology and Dr. SLeonie Manneurology. He has not had any syncopal spells.lab review shows that his creatinine might be worsening. In addition liver function test shows that his bilirubin might be worsening although still within somewhat of a normal range. He is on  low dosePirfenidone (Esbriet). In addition his COPD inhalers.  OV 11/23/2016  Chief Complaint  Patient presents with  . Acute Visit    States he noticed blood in his phlegm after using both of his inhalers during the past 2 days.  Increased SOB.     Fu for    ICD-9-CM ICD-10-CM   1. Cough with hemoptysis 786.39 R04.2   2. Encounter for therapeutic drug monitoring V58.83 Z51.81   3. Pulmonary emphysema, unspecified emphysema type (HCC) 492.8 J43.9   4. IPF (idiopathic pulmonary fibrosis) ([place]) 516.31 J84.112   5. Chronic hypoxemic respiratory failure (HCC) 518.83 J96.11    799.02      He's having new increased cough with phlegm for the last few days and since last night he's having hemoptysis. He is on Coumadin for A. fib. He is on Pirfenidone (Esbriet) for pulmonary fibrosis. He is on inhalers for associated emphysema. His wife is here with him. There have home palliative care but they tell me that the support from the palliative care services for. He did the most form after that they've not heard back from them. Wife states that she is looking at life alert system for him but has not heard back from them at all in the last 2 weeks. Calls have  gone unanswered. He's having class III-for dyspnea. Reported talking to me is labored. He is a  limited code. He is aware of his poor life expectancy. For his jaundice associated with Pirfenidone (Esbriet) it is stable or improved as of last check. He is having an appointment with Dr. Ardis Hughs in GI but is fully aware that procedurally he would be high risk. Is also having some nonspecific chest pains   Results for Patrick Weiss, Patrick Weiss (MRN [id number]) as of 11/23/2016 11:29  Ref. Range 10/20/2016 10:50  INR Unknown 2.7       Results for Patrick Weiss, Patrick Weiss (MRN [id number]) as of 11/23/2016 11:29  Ref. Range 09/29/2016 10:58 10/03/2016 11:03 10/20/2016 10:50 10/28/2016 12:15 11/09/2016 11:42  Creatinine Latest Ref Range: 0.40 - 1.50 mg/dL 2.01 (H) 1.92 (H)  1.75 (H)     Results for Patrick Weiss, Patrick Weiss (MRN [id number]) as of 11/23/2016 11:29  Ref. Range 09/29/2016 10:58 10/03/2016 11:03 10/20/2016 10:50 10/28/2016 12:15 11/09/2016 11:42  Total Bilirubin Latest Ref Range: 0.2 - 1.2 mg/dL 1.4 (H)   1.4 (H) 1.2     has a past medical history of A-fib ([place]); CHF (congestive heart failure) ([place]); COPD (chronic obstructive pulmonary disease) ([place]); Hypertension; Pulmonary fibrosis ([place]); Shortness of breath dyspnea; and Stroke ([place]).   reports that he quit smoking about 25 years ago. His smoking use included Cigarettes. He has a 30.00 pack-year smoking history. He has never used smokeless tobacco.  Past Surgical History:  Procedure Laterality Date  . BACK SURGERY  2001  . CARDIAC CATHETERIZATION N/A 04/28/2015   Procedure: Right/Left Heart Cath and Coronary Angiography;  Surgeon: Belva Crome, MD;  Location: [facility];  Service: Cardiovascular;  Laterality: N/A;  . CARDIAC CATHETERIZATION N/A 03/16/2016   Procedure: Right Heart Cath;  Surgeon: Belva Crome, MD;  Location: [facility];  Service: Cardiovascular;  Laterality: N/A;  . FEMUR FRACTURE SURGERY    . TIBIA FRACTURE SURGERY  1975    No Known Allergies  Immunization History  Administered Date(s) Administered  . Influenza Split 03/11/2014,  03/08/2016  . Influenza,inj,Quad PF,36+ Mos 03/16/2015    Family History  Problem Relation Age of Onset  . Cancer Father      Current Outpatient Prescriptions:  .  Aclidinium Bromide 400 MCG/ACT AEPB, Inhale 1 puff into the lungs 2 (two) times daily., Disp: 1 each, Rfl: 11 .  allopurinol (ZYLOPRIM) 100 MG tablet, Take 100 mg by mouth daily., Disp: , Rfl:  .  atorvastatin (LIPITOR) 10 MG tablet, TAKE 1 TABLET(10 MG) BY MOUTH DAILY, Disp: 90 tablet, Rfl: 1 .  Calcium Carbonate-Vitamin D (CALCIUM 600+D) 600-200 MG-UNIT TABS, Take 1 tablet by mouth 2 (two) times daily., Disp: , Rfl:  .  Carboxymethylcellulose Sodium (REFRESH TEARS OP), Place 1 drop into both eyes 3 (three) times daily as needed (for dry eyes). , Disp: , Rfl:  .  docusate sodium (COLACE) 100 MG capsule, Take 100 mg by mouth daily as needed for mild constipation., Disp: , Rfl:  .  ESBRIET 267 MG TABS, , Disp: , Rfl:  .  fexofenadine (ALLEGRA) 180 MG tablet, Take 180 mg by mouth daily. , Disp: , Rfl:  .  fluticasone (FLOVENT HFA) 44 MCG/ACT inhaler, Inhale 2 puffs into the lungs 2 (two) times daily., Disp: 10.6 g, Rfl: 5 .  furosemide (LASIX) 80 MG tablet, Take 1 tablet (80 mg total) by mouth 2 (two) times daily., Disp: 180 tablet, Rfl: 1 .  OVER THE COUNTER MEDICATION, Take 2 tablets by mouth daily. [facility] brand of pain reliever, Disp: , Rfl:  .  OXYGEN, Inhale 3-4 L into the lungs continuous., Disp: , Rfl:  .  Pirfenidone 267 MG CAPS, Take 2 capsules by mouth 3 (three) times daily. , Disp: , Rfl:  .  warfarin (COUMADIN) 5 MG tablet, TAKE AS DIRECTED (Patient taking differently: Take 7.5 mg by mouth in the morning on Sun and 5 mg on Mon/Tues/Wed/Thurs/Fri/Sat), Disp: 120 tablet, Rfl: 1   Review of Systems     Objective:   Physical Exam  Constitutional: He is oriented to person, place, and time. He appears well-developed and well-nourished. No distress.  HENT:  Head: Normocephalic and atraumatic.  Right Ear: External  ear normal.  Left Ear: External ear normal.  Mouth/Throat: Oropharynx is clear and moist. No oropharyngeal exudate.  02 on  Eyes: Conjunctivae and EOM are normal. Pupils are equal, round, and reactive to light. Right eye exhibits no discharge. Left eye exhibits no discharge. No scleral icterus.  Neck: Normal range of motion. Neck supple. No JVD present. No tracheal deviation present. No thyromegaly present.  Cardiovascular: Normal rate, regular rhythm and intact distal pulses.  Exam reveals no gallop and no friction rub.   No murmur heard. Pulmonary/Chest: Effort normal. No respiratory distress. He has no wheezes. He has rales. He exhibits no tenderness.  Labored even talking to me  Abdominal: Soft. Bowel sounds are normal. He exhibits no distension and no mass. There is no tenderness. There is no rebound and no guarding.  Musculoskeletal: Normal range of motion. He exhibits edema. He exhibits no tenderness.  Lymphadenopathy:    He has no cervical adenopathy.  Neurological: He is alert and oriented to person, place, and time. He has normal reflexes. No cranial nerve deficit. Coordination normal.  Skin: Skin is warm and dry. No rash noted. He is not diaphoretic. No erythema. No pallor.  Psychiatric: He has a normal mood and affect. His behavior is normal. Judgment and thought content normal.  Nursing note and vitals reviewed.      Vitals:   11/23/16 1117  BP: 110/70  Pulse: 87  SpO2: (!) 87%  Weight: 196 lb 11.2 oz (89.2 kg)  Height: _0  (1.753 m)    Estimated  body mass index is 29.05 kg/m as calculated from the following:   Height as of this encounter: 5' 9" (1.753 m).   Weight as of this encounter: 196 lb 11.2 oz (89.2 kg).     Assessment:       ICD-9-CM ICD-10-CM   1. Cough with hemoptysis 786.39 R04.2   2. Encounter for therapeutic drug monitoring V58.83 Z51.81   3. Pulmonary emphysema, unspecified emphysema type (HCC) 492.8 J43.9   4. IPF (idiopathic pulmonary  fibrosis) ([place]) 516.31 J84.112   5. Chronic hypoxemic respiratory failure (HCC) 518.83 J96.11    799.02         Plan:     Cough with hemoptysis New problem 11/23/2016 Could be acute bronchhitis with coumadin  Plan Check INR Check cXR 2 view Check CBC, BMET Take doxycycline 122m po twice daily x 5 days; take after meals and avoid sunlight Take prednisone 40 mg daily x 2 days, then 279mdaily x 2 days, then 1079maily x 2 days, then 5mg47mily x 2 days and stop   Encounter for therapeutic drug monitoring Prior increased creatinine nad LFT while on esbriet  Plan Check bmet and lft  COPD (chronic obstructive pulmonary disease) (HCC)[place]  IPF (idiopathic pulmonary fibrosis) (HCC) Severe diseease with emphysema and cor pulmolane and class 3-4 dyspnea  Plan Continue esbriet Wil call palliative care and recommend   -for them to send [place] MOST form  - consider oral syrup morphine for dyspnea  - work with you on life alert  - respond to your calls promptly  Followup 2 months - 3 months or sooner if needed  Chronic hypoxemic respiratory failure (HCC)[place] disease Slowly steadily progressive  Plan Continue 02  Atypical chest pain chck troponin    > 50% of this > 25 min visit spent in face to face counseling or coordination of care    Dr. MuraBrand MalesD., F.C.[facility] Pulmonary and Critical Care Medicine Staff Physician [facility] Pager: [phone] no answer or between  15:00h - 7:00h: call [phone]  11/23/2016 11:48 AM

## 2016-11-23 NOTE — Assessment & Plan Note (Signed)
Continue incruse

## 2016-11-23 NOTE — Telephone Encounter (Signed)
I have spoken with Patrick Weiss at [facility], and made her aware of below message. Patrick Weiss states a Rx for morphine will be needed, as palliative care doctors do not typically like prescribing morphine. Patrick Weiss also states pt's wife had contacted her about a life alert. Patrick Weiss states she has been researching this and will contact Vaughan Basta today. Patrick Weiss also states she will fax over consult sheet.  MR please advise on morphine. Thanks.

## 2016-11-23 NOTE — Telephone Encounter (Signed)
He can try morphine 38m syrup once daily as needed; first dose to be supervied for 30 min under [facility] visiting nurse supervision  Dr. MBrand Males M.D., FM[facility] Care[facility].P Pulmonary and Critical Care Medicine Staff Physician [facility]ulmonary and Critical Care Pager: [phone] If no answer or between  15:00h - 7:00h: call [phone]  11/23/2016 5:58 PM

## 2016-11-23 NOTE — Assessment & Plan Note (Signed)
Severe disease Slowly steadily progressive  Plan Continue 02

## 2016-11-23 NOTE — Assessment & Plan Note (Addendum)
New problem 11/23/2016 Could be acute bronchhitis with coumadin  Plan Check INR Check cXR 2 view Check CBC, BMET Take doxycycline 169m po twice daily x 5 days; take after meals and avoid sunlight Take prednisone 40 mg daily x 2 days, then 25mdaily x 2 days, then 1023maily x 2 days, then 5mg46mily x 2 days and stop

## 2016-11-23 NOTE — Assessment & Plan Note (Signed)
Prior increased creatinine nad LFT while on esbriet  Plan Check bmet and lft

## 2016-11-23 NOTE — Telephone Encounter (Signed)
Spoke with pt, who states he has had a prod cough with yellow mucus. Pt states two nights ago he started coughing up a small portion of blood. Pt states blood is bright red and occurs every 1-2hr. Pt has been scheduled for acute visit with MR at 11:15 today. Nothing further needed.

## 2016-11-23 NOTE — Telephone Encounter (Signed)
Triage/Elise  Please call [facility] - pall care wing. Wife expressed lot of concerns  1. Please tell them that I want them to consider morphine for dyspnea 2. Wife stating they are not calling back about her phone calls to them and have not heard back from them 3. Plese tell them to fax the MOST form to [place] so we can put in EMR  Thanks  Dr. Brand Males, M.D., [facility].C.P Pulmonary and Critical Care Medicine Staff Physician [place] Pulmonary and Critical Care Pager: [phone], If no answer or between  15:00h - 7:00h: call [phone]  11/23/2016 12:33 PM

## 2016-11-24 ENCOUNTER — Ambulatory Visit (INDEPENDENT_AMBULATORY_CARE_PROVIDER_SITE_OTHER)
Admission: RE | Admit: 2016-11-24 | Discharge: 2016-11-24 | Disposition: A | Discharge status: Home / Self Care | Payer: Medicare Other | Source: Ambulatory Visit | Attending: Internal Medicine | Admitting: Internal Medicine

## 2016-11-24 DIAGNOSIS — R9389 Abnormal findings on diagnostic imaging of other specified body structures: Secondary | ICD-10-CM

## 2016-11-24 DIAGNOSIS — R938 Abnormal findings on diagnostic imaging of other specified body structures: Secondary | ICD-10-CM

## 2016-11-24 MED ORDER — MORPHINE SULFATE 20 MG/5ML PO SOLN: 4.0000 mg | Freq: Every day | ORAL | 0 refills | Status: AC | PRN

## 2016-11-24 NOTE — Telephone Encounter (Signed)
Spoke with [facility] [phone]) and advised of MR recommendations for Morphine rx.  She asked that we have pt come pick up rx and take to pharmacy to get filled. She will work on getting a nurse to come supervise pt for the first dose.  Spoke with pt and advised him about picking up rx from our office and take to pharmacy to get it filled.  Pt also informed not to take first dose until nurse from [facility] is there to supervise.  PT verbalized understanding. RX printed and signed by MR and left at front desk for pick up.

## 2016-11-24 NOTE — Progress Notes (Signed)
Cardiology Office Note    Date:  11/25/2016   ID:  Patrick Weiss, DOB 1940-11-23, MRN [id number]  PCP:  Foye Spurling, MD  Cardiologist: Sinclair Grooms, MD   Chief Complaint  Patient presents with  . Congestive Heart Failure    History of Present Illness:  NUR Patrick Weiss is a 76 y.o. 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  Patrick Weiss is here today with his wife. He has noted a 10 pound weight gain in 2 weeks. He is having abdominal swelling. Earlier this week he had hemoptysis. A CT scan repeated by Dr. Chase Caller demonstrated progression of interstitial fibrosis and increase and right heart size. He has exertional fatigue. He notices significant r bilateral lower extremity swelling. No chest pain or syncope. No palpitations.  Past Medical History:  Diagnosis Date  . A-fib (HCC)    paroxysmal  . CHF (congestive heart failure) ([place])   . COPD (chronic obstructive pulmonary disease) ([place])   . Hypertension   . Pulmonary fibrosis ([place])   . Shortness of breath dyspnea   . 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 BACK SURGERY  2001  . CARDIAC CATHETERIZATION N/A 04/28/2015   Procedure: Right/Left Heart Cath and Coronary Angiography;  Surgeon: Belva Crome, MD;  Location: [place] [facility];  Service: Cardiovascular;  Laterality: N/A;  . CARDIAC CATHETERIZATION N/A 03/16/2016   Procedure: Right Heart Cath;  Surgeon: Belva Crome, MD;  Location: [place] [facility];  Service: Cardiovascular;  Laterality: N/A;  . FEMUR FRACTURE SURGERY    . TIBIA FRACTURE SURGERY  1975    Current Medications: Outpatient Medications Prior to Visit  Medication  Sig Dispense Refill  . Aclidinium Bromide 400 MCG/ACT AEPB Inhale 1 puff into the lungs 2 (two) times daily. 1 each 11  . allopurinol (ZYLOPRIM) 100 MG tablet Take 100 mg by mouth daily.    Marland Kitchen atorvastatin (LIPITOR) 10 MG tablet TAKE 1 TABLET(10 MG) BY MOUTH DAILY 90 tablet 1  . Calcium Carbonate-Vitamin D (CALCIUM 600+D) 600-200 MG-UNIT TABS Take 1 tablet by mouth 2 (two) times daily.    . Carboxymethylcellulose Sodium (REFRESH TEARS OP) Place 1 drop into both eyes 3 (three) times daily as needed (for dry eyes).     Marland Kitchen docusate sodium (COLACE) 100 MG capsule Take 100 mg by mouth daily as needed for mild constipation.    Marland Kitchen doxycycline (VIBRA-TABS) 100 MG tablet Take 2 tabs x 5 days 10 tablet 0  . fexofenadine (ALLEGRA) 180 MG tablet Take 180 mg by mouth daily.     . fluticasone (FLOVENT HFA) 44 MCG/ACT inhaler Inhale 2 puffs into the lungs 2 (two) times daily. 10.6 g 5  . furosemide (LASIX) 80 MG tablet Take 1 tablet (80 mg total) by mouth 2 (two) times daily. 180 tablet 1  . morphine 20 MG/5ML solution Take 1 mL (4 mg total) by mouth daily as needed (dyspnea). 100 mL 0  . OVER THE COUNTER MEDICATION Take 2 tablets by mouth daily. [facility] brand of pain reliever    . OXYGEN Inhale 3-4 L into the lungs continuous.    Marland Kitchen  Pirfenidone 267 MG CAPS Take 2 capsules by mouth 3 (three) times daily.     . predniSONE (DELTASONE) 10 MG tablet Take 4 tabs for 2 days, then 3 tabs for 2 days, 2 tabs for 2 days, then 1 tab for 2 days, then stop. 20 tablet 0  . warfarin (COUMADIN) 5 MG tablet TAKE AS DIRECTED (Patient taking differently: Take 7.5 mg by mouth in the morning on Sun and 5 mg on Mon/Tues/Wed/Thurs/Fri/Sat) 120 tablet 1  . ESBRIET 267 MG TABS      No facility-administered medications prior to visit.      Allergies:   Patient has no known allergies.   Social History   Social History  . Marital status: Married    Spouse name: N/A  . Number of children: N/A  . Years of education: N/A    Occupational History  . retired    Social History Main Topics  . Smoking status: Former Smoker    Packs/day: 1.00    Years: 30.00    Types: Cigarettes    Quit date: 09/09/1991  . Smokeless tobacco: Never Used  . Alcohol use No     Comment: glass of wine with meals  . Drug use: No  . Sexual activity: Yes    Partners: Female   Other Topics Concern  . None   Social History Narrative  . None     Family History:  The patient's family history includes Cancer in his father.   ROS:   Please see the history of present illness.    Unexplained weight gain, decreased appetite, chills, hemoptysis, leg swelling, dyspnea on exertion, cough, abdominal pain, increase in the size of ventral hernia, muscle pain, wheezing, and excessive fatigue. Difficulty with balance. Hemoptysis.  All other systems reviewed and are negative.   PHYSICAL EXAM:   VS:  BP 110/80 (BP Location: Left Arm)   Pulse 90   Ht _0  (1.753 m)   Wt 202 lb 6.4 oz (91.8 kg)   BMI 29.89 kg/m    GEN: Well nourished, well developed, in no acute distress  HEENT: normal  Neck: Significant JVD to the angle of the jaw with the patient sitting and legs dangling from the gurney. No carotid bruits, or masses. Cardiac: RRR; no murmurs, rubs, or gallops,. Marked bilateral lower extremity edema. Respiratory:  clear to auscultation bilaterally, normal work of breathing GI: Significant abdominal ascites. Relatively tense abdomen. Ventral hernia noted. MS: no deformity or atrophy  Skin: warm and dry, no rash Neuro:  Alert and Oriented x 3, Strength and sensation are intact Psych: euthymic mood, full affect  Wt Readings from Last 3 Encounters:  11/25/16 202 lb 6.4 oz (91.8 kg)  11/23/16 196 lb 11.2 oz (89.2 kg)  10/31/16 192 lb (87.1 kg)      Studies/Labs Reviewed:   EKG:  EKG  Not repeated  Recent Labs: 04/21/2016: NT-Pro BNP 4,540 09/22/2016: B Natriuretic Peptide 1,520.5 11/23/2016: ALT 15; BUN 51; Creatinine, Ser  2.11; Hemoglobin 15.2; Platelets 159.0; Potassium 4.9; Sodium 142   Lipid Panel    Component Value Date/Time   CHOL 114 (L) 06/15/2015 0828   TRIG 61 06/15/2015 0828   HDL 41 06/15/2015 0828   CHOLHDL 2.8 06/15/2015 0828   VLDL 12 06/15/2015 0828   LDLCALC 61 06/15/2015 0828    Additional studies/ records that were reviewed today include:  CT performed 11/24/16: IMPRESSION: 1. The appearance of the lungs is compatible with interstitial lung disease, with a pattern considered  diagnostic of usual interstitial pneumonia (UIP), demonstrating slight progression compared to the prior study. 2. In addition, there is diffuse bronchial wall thickening with severe centrilobular and paraseptal emphysema; imaging findings suggestive of underlying COPD. 3. Aortic atherosclerosis, in addition to left main and 3 vessel coronary artery disease. Assessment for potential risk factor modification, dietary therapy or pharmacologic therapy may be warranted, if clinically indicated. 4. Cardiomegaly with severe right atrial and right ventricular dilatation. 5. Small amount of pericardial fluid and/or thickening, unlikely to be of hemodynamic significance at this time. No associated pericardial calcification. 6. Large volume of ascites. 7. Additional incidental findings, as above.   Electronically Signed   By: Vinnie Langton M.D.   ASSESSMENT:    1. Cor pulmonale, chronic ([place])   2. IPF (idiopathic pulmonary fibrosis) ([place])   3. HYPERTENSION, BENIGN   4. Coronary artery disease involving native coronary artery of native heart without angina pectoris   5. CKD (chronic kidney disease), stage IV (HCC)      PLAN:  In order of problems listed above:  1. Cor pulmonale now with developing ascites. Severe bilateral lower extremity edema. Clinical evidence of severe pulmonary hypertension related to usual idiopathic pulmonary fibrosis. This appears to be a progressive end-stage process. I  would recommending consideration of therapeutic paracentesis and possibly adding intermittent doses of Zaroxolyn to help with diuresis. More aggressive diuresis will worsen kidney function (see below). 2. Followed and managed by Dr. Chase Caller. 3. Not currently an issue. 4. Asymptomatic. 5. Worsening kidney function is multifactorial related to right heart failure, hypoxia, decreased renal perfusion, and diuresis. More aggressive diuresis will worsen kidney function.  At this point I believe the process is end-stage. It appears that ESBRIET is not working. is not currently working. We'll coordinate any changes in medication through Dr. Chase Caller. No follow-up visit is given but I will be happy to see him if needed.    Medication Adjustments/Labs and Tests Ordered: Current medicines are reviewed at length with the patient today.  Concerns regarding medicines are outlined above.  Medication changes, Labs and Tests ordered today are listed in the Patient Instructions below. Patient Instructions  Medication Instructions:  None  Labwork: None  Testing/Procedures: None  Follow-Up: Your physician recommends that you schedule a follow-up appointment as needed with Dr. Tamala Julian.    Any Other Special Instructions Will Be Listed Below (If Applicable).     If you need a refill on your cardiac medications before your next appointment, please call your pharmacy.      Signed, Sinclair Grooms, MD  11/25/2016 2:09 PM    [facility] [address], [place], [place]  [address] Phone: [phone]; Fax: [phone]

## 2016-11-25 ENCOUNTER — Telehealth: Payer: Self-pay | Admitting: Internal Medicine

## 2016-11-25 ENCOUNTER — Ambulatory Visit (INDEPENDENT_AMBULATORY_CARE_PROVIDER_SITE_OTHER): Payer: Medicare Other | Admitting: *Deleted

## 2016-11-25 ENCOUNTER — Encounter: Payer: Self-pay | Admitting: Interventional Cardiology

## 2016-11-25 ENCOUNTER — Ambulatory Visit (INDEPENDENT_AMBULATORY_CARE_PROVIDER_SITE_OTHER): Payer: Medicare Other | Admitting: Interventional Cardiology

## 2016-11-25 VITALS — BP 110/80 | HR 90 | Ht 69.0 in | Wt 202.4 lb

## 2016-11-25 DIAGNOSIS — J84112 Idiopathic pulmonary fibrosis: Secondary | ICD-10-CM | POA: Diagnosis not present

## 2016-11-25 DIAGNOSIS — I4891 Unspecified atrial fibrillation: Secondary | ICD-10-CM

## 2016-11-25 DIAGNOSIS — I10 Essential (primary) hypertension: Secondary | ICD-10-CM

## 2016-11-25 DIAGNOSIS — I6350 Cerebral infarction due to unspecified occlusion or stenosis of unspecified cerebral artery: Secondary | ICD-10-CM

## 2016-11-25 DIAGNOSIS — N184 Chronic kidney disease, stage 4 (severe): Secondary | ICD-10-CM | POA: Diagnosis not present

## 2016-11-25 DIAGNOSIS — I2781 Cor pulmonale (chronic): Secondary | ICD-10-CM

## 2016-11-25 DIAGNOSIS — I480 Paroxysmal atrial fibrillation: Secondary | ICD-10-CM

## 2016-11-25 DIAGNOSIS — I2510 Atherosclerotic heart disease of native coronary artery without angina pectoris: Secondary | ICD-10-CM

## 2016-11-25 DIAGNOSIS — Z7901 Long term (current) use of anticoagulants: Secondary | ICD-10-CM | POA: Diagnosis not present

## 2016-11-25 LAB — POCT INR: INR: 1.9

## 2016-11-25 NOTE — Telephone Encounter (Signed)
Called and spoke with pts wife and she is aware of results. Nothing further is needed.

## 2016-11-25 NOTE — Telephone Encounter (Signed)
Please call patient's wife with results, [phone].

## 2016-11-25 NOTE — Telephone Encounter (Signed)
Patient calling for CT results - pt can be reached at [phone] -pr

## 2016-11-25 NOTE — Patient Instructions (Signed)
Medication Instructions:  None  Labwork: None  Testing/Procedures: None  Follow-Up: Your physician recommends that you schedule a follow-up appointment as needed with Dr. Tamala Julian.    Any Other Special Instructions Will Be Listed Below (If Applicable).     If you need a refill on your cardiac medications before your next appointment, please call your pharmacy.

## 2016-11-25 NOTE — Telephone Encounter (Signed)
Let Patrick Weiss know that   1 IPF worse since 2016 but we knew that 2. STress of lung affected right heart accounting for swelling in legs and increased o2 need - ew knew that but there is also siginficant fluid in belly called ascited. Glad he is seeing GI 3. Let me know that at next visti will discuss coming off esbriet - doubt working anymore  Dr. Brand Males, M.D., [facility].C.P Pulmonary and Critical Care Medicine Staff Physician [place] [facility] Pager: [phone], If no answer or between  15:00h - 7:00h: call [phone]  11/25/2016 8:50 AM      Dg Chest 2 View  Result Date: 11/23/2016 CLINICAL DATA:  Two days of hemoptysis. History of idiopathic pulmonary fibrosis, COPD, CHF, former smoker. Previous episodes of pneumonia. EXAM: CHEST  2 VIEW COMPARISON:  Portable chest x-ray of September 22, 2016 FINDINGS: The lungs remain well-expanded. There is new increased interstitial density peripherally in the left mid lung. The interstitial markings at both lung bases have increased. There is mild blunting of the right lateral costophrenic angle. The posterior costophrenic angles remain clear. There did cardiac silhouette is mildly enlarged. The pulmonary vascularity is not engorged. There is calcification in the wall of the aortic arch. IMPRESSION: Worsening of the appearance of the interstitium of the left mid lung laterally. Coarse lung markings at both lung bases are not new but are more conspicuous. This may reflect acute interstitial pneumonia or acute exacerbation of an underlying inflammatory process such as UIP. Cardiomegaly without pulmonary vascular congestion. Thoracic aortic atherosclerosis. Given the hemoptysis, chest CT scanning would be a useful next imaging step. Electronically Signed   By: David  Martinique M.D.   On: 11/23/2016 13:21   Ct Chest Wo Contrast  Result Date: 11/24/2016 CLINICAL DATA:  76 year old male on oxygen with worsening  shortness of breath. Two days in a month ptosis. Former smoker. History of idiopathic pulmonary fibrosis. EXAM: CT CHEST WITHOUT CONTRAST TECHNIQUE: Multidetector CT imaging of the chest was performed following the standard protocol without IV contrast. COMPARISON:  Chest CT 10/23/2014. FINDINGS: Cardiovascular: Heart size is mildly enlarged with right atrial and right ventricular dilatation. Small amount of pericardial fluid and/or thickening, unlikely to be of any hemodynamic significance at this time. No associated pericardial calcification. There is aortic atherosclerosis, as well as atherosclerosis of the great vessels of the mediastinum and the coronary arteries, including calcified atherosclerotic plaque in the left main, left anterior descending, left circumflex and right coronary arteries. Mediastinum/Nodes: No pathologically enlarged mediastinal or hilar lymph nodes. Please note that accurate exclusion of hilar adenopathy is limited on noncontrast CT scans. Esophagus is unremarkable in appearance. No axillary lymphadenopathy. Lungs/Pleura: Patchy areas of peripheral predominant septal thickening, subpleural reticulation, parenchymal banding, traction bronchiectasis and frank honeycombing are again noted throughout the lungs bilaterally, with a definitive craniocaudal gradient, and imaging pattern which is considered diagnostic of usual interstitial pneumonia. Findings have slightly progressed compared to prior study from 10/23/2014. Inspiratory and expiratory imaging was not performed (study was not a high-resolution chest CT). No acute consolidative airspace disease. No pleural effusions. Diffuse bronchial wall thickening with severe centrilobular and paraseptal emphysema also noted. Upper Abdomen: Large volume of ascites. Incompletely visualized low-attenuation lesion in the upper pole of the left kidney measuring at least 4 cm. Aortic atherosclerosis. Calcified granuloma in the right lobe of the liver.  Musculoskeletal: There are no aggressive appearing lytic or blastic lesions noted in  the visualized portions of the skeleton. IMPRESSION: 1. The appearance of the lungs is compatible with interstitial lung disease, with a pattern considered diagnostic of usual interstitial pneumonia (UIP), demonstrating slight progression compared to the prior study. 2. In addition, there is diffuse bronchial wall thickening with severe centrilobular and paraseptal emphysema; imaging findings suggestive of underlying COPD. 3. Aortic atherosclerosis, in addition to left main and 3 vessel coronary artery disease. Assessment for potential risk factor modification, dietary therapy or pharmacologic therapy may be warranted, if clinically indicated. 4. Cardiomegaly with severe right atrial and right ventricular dilatation. 5. Small amount of pericardial fluid and/or thickening, unlikely to be of hemodynamic significance at this time. No associated pericardial calcification. 6. Large volume of ascites. 7. Additional incidental findings, as above. Electronically Signed   By: Vinnie Langton M.D.   On: 11/24/2016 09:21

## 2016-11-25 NOTE — Telephone Encounter (Signed)
lmomtcb x 1 for the pt to review results of CT per MR.

## 2016-11-26 LAB — TROPONIN T: Troponin T TROPT: 0.01 ng/mL — ABNORMAL HIGH (ref <0.01)

## 2016-11-27 IMAGING — CT CT CHEST HIGH RESOLUTION W/O CM
3 of 7 series · 13 of 36 positions shown, 15 images · non-contrast
Comparison: No priors.  CT of the abdomen and pelvis 02/18/2014.

CLINICAL DATA: 74-year-old male with worsening shortness of breath
with exertion over the past 3-4 months. Prior history of smoking
(quit in 7666). Evaluate for interstitial lung disease.

EXAM:
CT CHEST WITHOUT CONTRAST
TECHNIQUE: Multidetector CT imaging of the chest was performed following the
standard protocol without intravenous contrast. High resolution
imaging of the lungs, as well as inspiratory and expiratory imaging,
was performed.

[Series 5: lung · axial · 0.68mm/px · z∈[-294,-54]mm · 7 of 64 slices shown, 9 images]
[im 8/64  mediastinal]
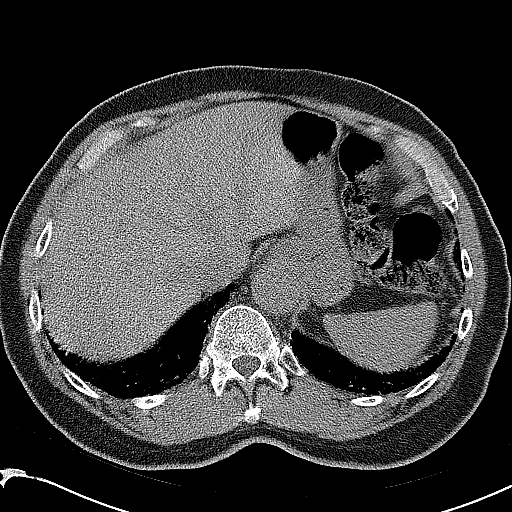
[im 8/64  lung]
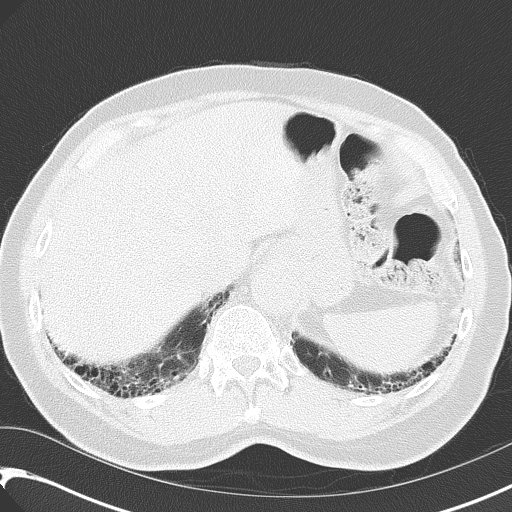
[im 16/64  lung]
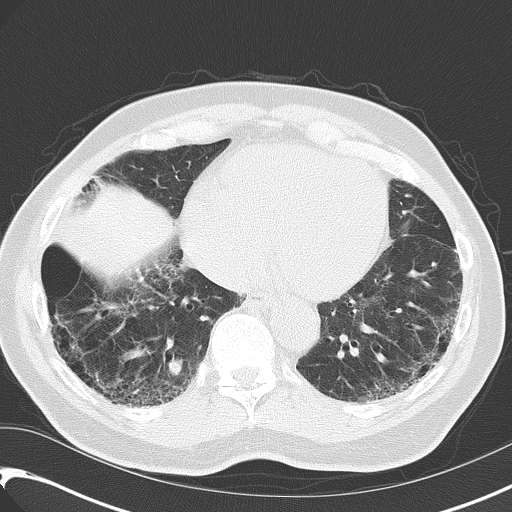
[im 24/64  lung]
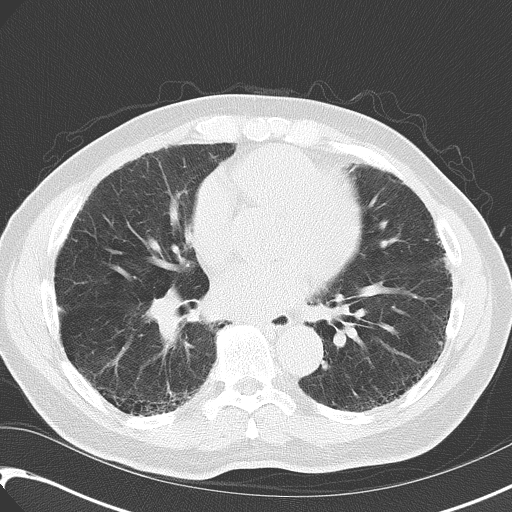
[im 32/64  lung]
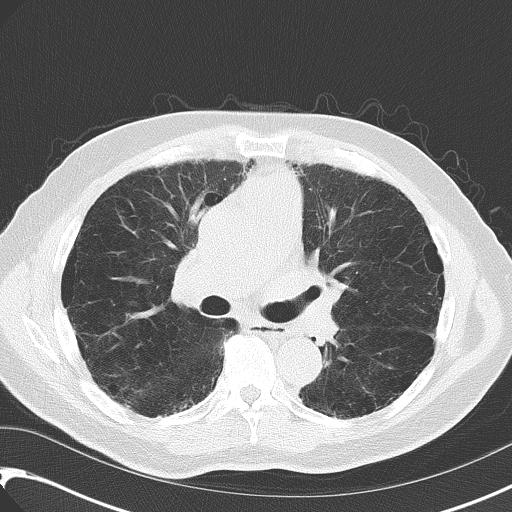
[im 40/64  mediastinal]
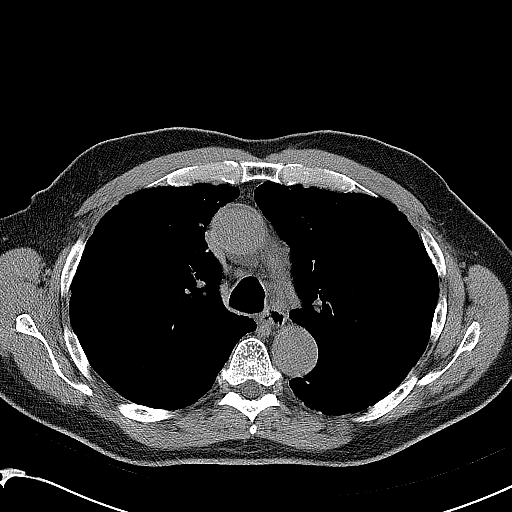
[im 40/64  lung]
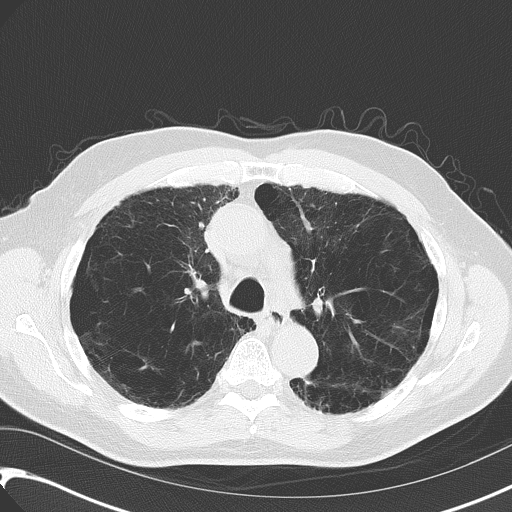
[im 48/64  lung]
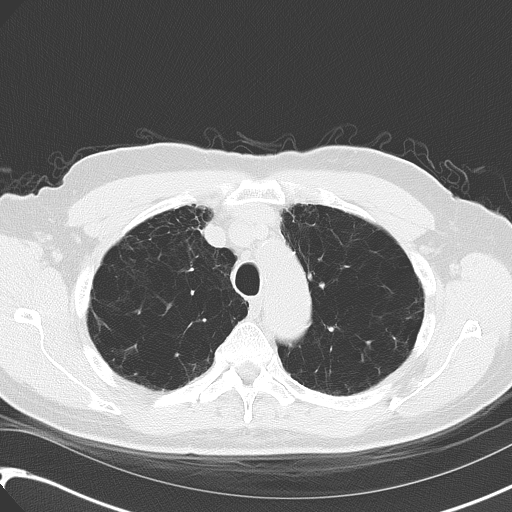
[im 56/64  lung]
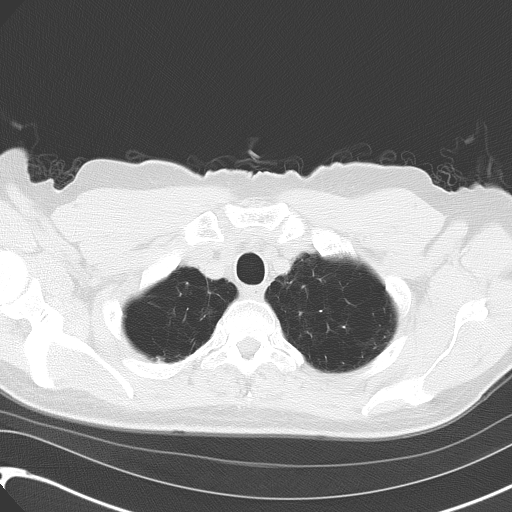

[Series 8: hires retro entire lungs · axial · 0.68mm/px · z∈[-249,-89]mm · 3 of 33 slices shown]
[im 9/33  lung]
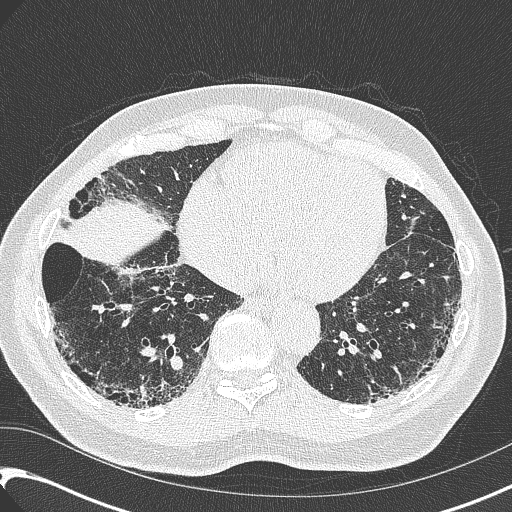
[im 17/33  lung]
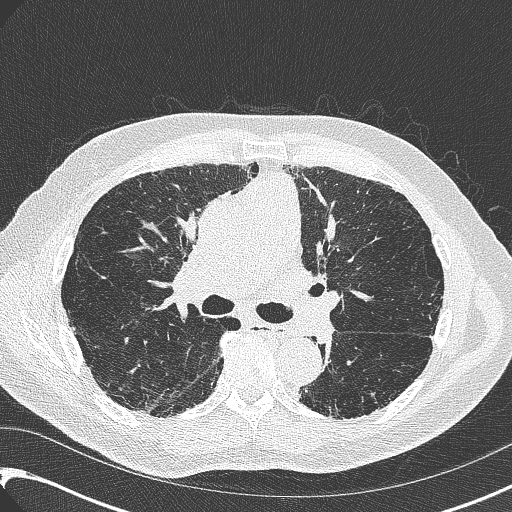
[im 25/33  lung]
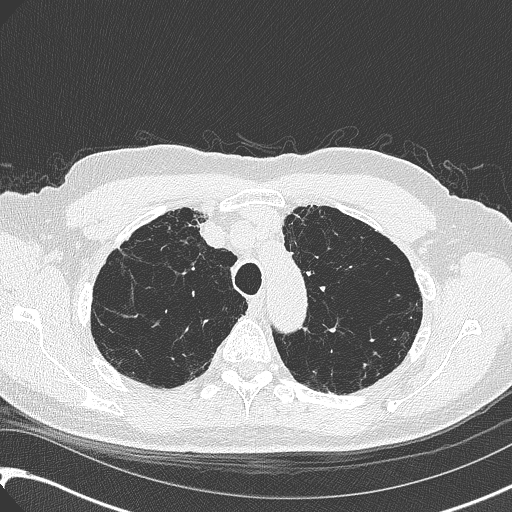

[Series 602: cor · coronal · 0.68mm/px · 3 of 115 slices shown]
[im 23/115  lung]
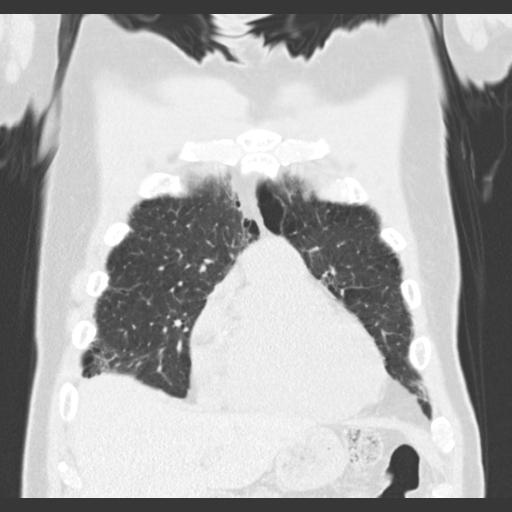
[im 46/115  lung]
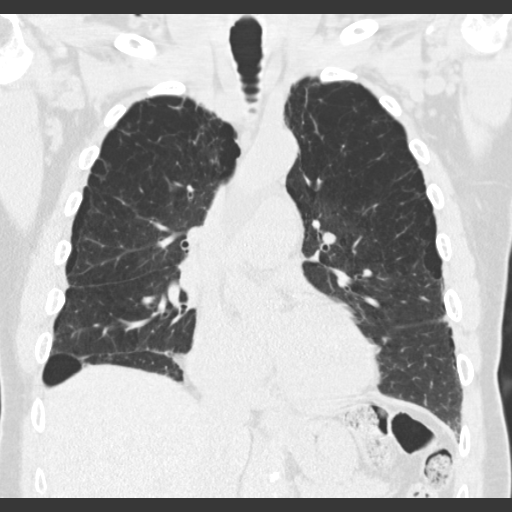
[im 69/115  lung]
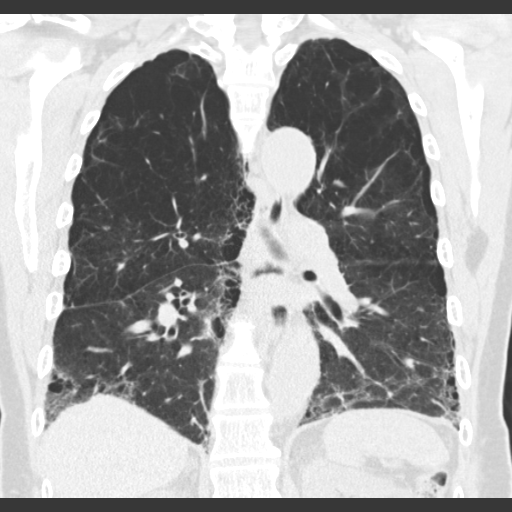

[13 of 36 positions shown; findings below may reference images not displayed]

FINDINGS: Mediastinum/Lymph Nodes: Heart size is normal. There is no
significant pericardial fluid, thickening or pericardial
calcification. There is atherosclerosis of the thoracic aorta, the
great vessels of the mediastinum and the coronary arteries,
including calcified atherosclerotic plaque in the left anterior
descending, left circumflex and right coronary arteries. No
pathologically enlarged mediastinal or hilar lymph nodes. Please
note that accurate exclusion of hilar adenopathy is limited on
noncontrast CT scans. Esophagus is unremarkable in appearance. No
axillary lymphadenopathy.

Lungs/Pleura: High-resolution images demonstrate diffuse bronchial
wall thickening with severe centrilobular and moderate paraseptal
emphysema. In addition, there are areas of subpleural reticulation,
parenchymal banding, mild cylindrical traction bronchiectasis and
frank honeycombing which are most evident in the lower lobes of the
lungs bilaterally. These findings appear slightly progressive when
compared to prior study 02/18/2014. Inspiratory and expiratory
imaging is unremarkable. No acute consolidative airspace disease. No
pleural effusions. No suspicious appearing pulmonary nodules or
masses.

Upper Abdomen: Incompletely visualized low-attenuation lesion
measuring at least 4.9 cm in diameter in the upper pole of the left
kidney, similar to remote prior study 02/18/2014; although
incompletely characterize, this is likely a cyst. Small calcified
granuloma in the right lobe of the liver.

Musculoskeletal/Soft Tissues: There are no aggressive appearing
lytic or blastic lesions noted in the visualized portions of the
skeleton.
IMPRESSION: 1. There is a combination of emphysematous changes (severe
centrilobular and moderate paraseptal emphysema), as well as
fibrotic changes in the lungs, as detailed above. Fibrotic findings
in the lung bases appear progressive compared to the prior
examination, and given the presence of honeycombing, are concerning
for progressively worsening usual interstitial pneumonia (UIP).
2. Atherosclerosis, including 3 vessel coronary artery disease.
Assessment for potential risk factor modification, dietary therapy
or pharmacologic therapy may be warranted, if clinically indicated.
3. Additional incidental findings, as above.

## 2016-11-28 ENCOUNTER — Ambulatory Visit (INDEPENDENT_AMBULATORY_CARE_PROVIDER_SITE_OTHER): Payer: Medicare Other | Admitting: *Deleted

## 2016-11-28 DIAGNOSIS — I480 Paroxysmal atrial fibrillation: Secondary | ICD-10-CM | POA: Diagnosis not present

## 2016-11-28 DIAGNOSIS — I6350 Cerebral infarction due to unspecified occlusion or stenosis of unspecified cerebral artery: Secondary | ICD-10-CM

## 2016-11-28 DIAGNOSIS — Z7901 Long term (current) use of anticoagulants: Secondary | ICD-10-CM | POA: Diagnosis not present

## 2016-11-28 DIAGNOSIS — I4891 Unspecified atrial fibrillation: Secondary | ICD-10-CM | POA: Diagnosis not present

## 2016-11-28 LAB — POCT INR: INR: 2.4

## 2016-12-07 ENCOUNTER — Telehealth: Payer: Self-pay | Admitting: Internal Medicine

## 2016-12-07 NOTE — Telephone Encounter (Signed)
Spoke with the pt's spouse and notified of recs per MR  She verbalized understanding  His GI appt was moved up to 12/12/16 with Dr. Ardis Hughs  Will forward to MR to make him aware

## 2016-12-07 NOTE — Telephone Encounter (Signed)
PM, can you please advise? Message was sent to MR this morning but he has not responded. Thanks.

## 2016-12-07 NOTE — Telephone Encounter (Signed)
Not sure if there is much else can do for him. Cant diurese him as Cr is high. Recommend that he go to ED for paracentesis if symptoms get worse before the appointment.   Will CC Dr. Chase Caller so that he can address tomorrow if there are alternatives.   PM

## 2016-12-07 NOTE — Telephone Encounter (Signed)
Spoke with Jackie Plum, emergency contact. Patient is scheduled for 06/12 with Dr. Ardis Hughs. Wants an earlier appt but GI does not have any other appts for him. She states they are concerned about the swelling and feel that the appt on the 12th is too far out. They want to know if you could recommend something for him. Patient is not in any distress. No nausea or vomitting. No increase in SOB.    MR, please advise.

## 2016-12-08 NOTE — Telephone Encounter (Signed)
Spoke with pt's [place], aware of recs.  Nothing further needed.

## 2016-12-08 NOTE — Telephone Encounter (Addendum)
He is already on high dose lasix. I think we are running out of otpions. He can increase his lasi xfrom 6m bid to 842mtid. SHould chek bmet, cbc and lft early next week    Dr. MuBrand MalesM.D., F.[facility].P Pulmonary and Critical Care Medicine Staff Physician [place] Pager: [phone] no answer or between  15:00h - 7:00h: call [phone]  12/08/2016 12:18 PM

## 2016-12-12 ENCOUNTER — Telehealth: Payer: Self-pay | Admitting: *Deleted

## 2016-12-12 NOTE — Telephone Encounter (Signed)
Pt called to inform [facility] that he has been prescribed Amitiza & will take 1 tablet daily for 24 days & Sorbitol 65m daily. Pt aware the meds will need interfere with Coumadin. Pt missed appt today therefore, rescheduled until tomorrow.

## 2016-12-13 ENCOUNTER — Ambulatory Visit (INDEPENDENT_AMBULATORY_CARE_PROVIDER_SITE_OTHER): Payer: Medicare Other | Admitting: *Deleted

## 2016-12-13 DIAGNOSIS — Z7901 Long term (current) use of anticoagulants: Secondary | ICD-10-CM | POA: Diagnosis not present

## 2016-12-13 DIAGNOSIS — I6350 Cerebral infarction due to unspecified occlusion or stenosis of unspecified cerebral artery: Secondary | ICD-10-CM | POA: Diagnosis not present

## 2016-12-13 DIAGNOSIS — I480 Paroxysmal atrial fibrillation: Secondary | ICD-10-CM | POA: Diagnosis not present

## 2016-12-13 DIAGNOSIS — I4891 Unspecified atrial fibrillation: Secondary | ICD-10-CM

## 2016-12-13 LAB — POCT INR: INR: 1.7

## 2016-12-15 ENCOUNTER — Telehealth: Payer: Self-pay | Admitting: Pharmacist

## 2016-12-15 ENCOUNTER — Telehealth: Payer: Self-pay | Admitting: Internal Medicine

## 2016-12-15 NOTE — Telephone Encounter (Signed)
Call from Hospice who will be following with pt - they are asking for verbal order to check INRs until patient passes. Provided verbal order for INR to be checked on 6/14 and called to Coumadin clinic for dosing.

## 2016-12-15 NOTE — Telephone Encounter (Signed)
I did not need this encounter

## 2016-12-20 ENCOUNTER — Ambulatory Visit: Payer: Medicare Other | Admitting: Gastroenterology

## 2016-12-22 ENCOUNTER — Ambulatory Visit (INDEPENDENT_AMBULATORY_CARE_PROVIDER_SITE_OTHER): Payer: Medicare Other | Admitting: Cardiology

## 2016-12-22 DIAGNOSIS — Z7901 Long term (current) use of anticoagulants: Secondary | ICD-10-CM

## 2016-12-22 DIAGNOSIS — I480 Paroxysmal atrial fibrillation: Secondary | ICD-10-CM

## 2016-12-22 LAB — POCT INR: INR: 3.5

## 2016-12-30 ENCOUNTER — Ambulatory Visit (INDEPENDENT_AMBULATORY_CARE_PROVIDER_SITE_OTHER): Payer: Medicare Other

## 2016-12-30 DIAGNOSIS — I480 Paroxysmal atrial fibrillation: Secondary | ICD-10-CM

## 2016-12-30 DIAGNOSIS — Z7901 Long term (current) use of anticoagulants: Secondary | ICD-10-CM

## 2016-12-30 LAB — POCT INR: INR: 4.1

## 2016-12-30 NOTE — Progress Notes (Signed)
PFT done.  

## 2017-01-04 ENCOUNTER — Ambulatory Visit (INDEPENDENT_AMBULATORY_CARE_PROVIDER_SITE_OTHER): Payer: Medicare Other | Admitting: Cardiology

## 2017-01-04 DIAGNOSIS — I480 Paroxysmal atrial fibrillation: Secondary | ICD-10-CM

## 2017-01-04 DIAGNOSIS — Z7901 Long term (current) use of anticoagulants: Secondary | ICD-10-CM

## 2017-01-04 LAB — POCT INR: INR: 3.4

## 2017-01-13 ENCOUNTER — Telehealth: Payer: Self-pay | Admitting: *Deleted

## 2017-01-13 NOTE — Telephone Encounter (Signed)
[place] because the pt is overdue for an INR result.  Shay RN with Hospice returned a call to inform me that she could not get an INR yesterday & since there is a hassle with the pt  switching days that the pt is better off coming back into the clinic. Advised that in the future when they cannot obtain an INR to call so we care aware & can get the pt scheduled in the office in a timely manner.   Called the pt & instructed him that we need to see him in the office for an INR check on Monday since [facility] could not obtain. After much discussion the pt set an appt to come into the office on 01/16/17. He stated he would call back if he needed to change to a better day that fits their schedule.

## 2017-01-14 LAB — POCT INR: INR: 3.1

## 2017-01-16 ENCOUNTER — Ambulatory Visit (INDEPENDENT_AMBULATORY_CARE_PROVIDER_SITE_OTHER): Payer: Medicare Other | Admitting: Cardiovascular Disease

## 2017-01-16 DIAGNOSIS — Z7901 Long term (current) use of anticoagulants: Secondary | ICD-10-CM

## 2017-01-16 DIAGNOSIS — I480 Paroxysmal atrial fibrillation: Secondary | ICD-10-CM

## 2017-01-20 ENCOUNTER — Ambulatory Visit (INDEPENDENT_AMBULATORY_CARE_PROVIDER_SITE_OTHER): Payer: Self-pay | Admitting: Internal Medicine

## 2017-01-20 DIAGNOSIS — I480 Paroxysmal atrial fibrillation: Secondary | ICD-10-CM

## 2017-01-20 DIAGNOSIS — Z7901 Long term (current) use of anticoagulants: Secondary | ICD-10-CM

## 2017-01-20 LAB — POCT INR: INR: 3.2

## 2017-01-26 ENCOUNTER — Telehealth: Payer: Self-pay | Admitting: Internal Medicine

## 2017-01-26 NOTE — Telephone Encounter (Signed)
MR  This is a Paediatric nurse from hospice called wanting to know if pt still needed to keep his appt for Tuesday 01/31/17. Spoke with pt and pt's wife, they state that it is just getting more difficult for patient to ambulate due to his edema. Pt's wife states there has not been any sufficient changes. Spoke with Daneil Dan and she states it is up to them if they would like to keep appt. Spoke with wife who states she wishes to hold off on his follow up appt and will contact us if anything changes.

## 2017-01-27 ENCOUNTER — Ambulatory Visit (INDEPENDENT_AMBULATORY_CARE_PROVIDER_SITE_OTHER): Payer: Medicare Other | Admitting: Internal Medicine

## 2017-01-27 DIAGNOSIS — I480 Paroxysmal atrial fibrillation: Secondary | ICD-10-CM

## 2017-01-27 DIAGNOSIS — Z7901 Long term (current) use of anticoagulants: Secondary | ICD-10-CM

## 2017-01-27 LAB — POCT INR: INR: 4.3

## 2017-01-30 NOTE — Telephone Encounter (Signed)
ATC pt automatic voice states "person you are trying to reach is not accepting calls right now,please try you call back later"

## 2017-01-30 NOTE — Telephone Encounter (Signed)
Cancel appt 01/31/17 and all future appt. Is he still on essbriet? Or ofev?  Dr. Brand Males, M.D., [facility].C.P Pulmonary and Critical Care Medicine Staff Physician [place] [facility] Pager: [phone], If no answer or between  15:00h - 7:00h: call [phone]  01/30/2017 11:17 AM

## 2017-01-31 ENCOUNTER — Ambulatory Visit: Payer: Medicare Other | Admitting: Internal Medicine

## 2017-02-08 DEATH — deceased

## 2017-03-23 ENCOUNTER — Telehealth: Payer: Self-pay | Admitting: Internal Medicine

## 2017-03-23 NOTE — Telephone Encounter (Signed)
[name] was an enrolled subject in the IPF PRO Registry Trial. Site staff were notified by the trial sponsor that the subject passed away on 2017/03/02. This information was confirmed by a call to the subjects home by the [facility] call center. PI, Dr. Chase Caller was notified. Refer to the subjects paper source binder for further details.   [place], [facility] 13/Sep/2018

## 2018-12-05 ENCOUNTER — Other Ambulatory Visit: Payer: Self-pay | Admitting: *Deleted

## 2022-02-21 NOTE — Telephone Encounter (Signed)
error
